# Patient Record
Sex: Female | Born: 1935 | Race: White | Hispanic: No | Marital: Married | Smoking: Never smoker
Health system: Southern US, Community
[De-identification: ages and names within clinical notes are randomized; demographics above are authoritative.]

## PROBLEM LIST (undated history)

## (undated) DIAGNOSIS — I219 Acute myocardial infarction, unspecified: Secondary | ICD-10-CM

## (undated) DIAGNOSIS — I509 Heart failure, unspecified: Secondary | ICD-10-CM

## (undated) DIAGNOSIS — I4891 Unspecified atrial fibrillation: Secondary | ICD-10-CM

## (undated) DIAGNOSIS — I2510 Atherosclerotic heart disease of native coronary artery without angina pectoris: Secondary | ICD-10-CM

## (undated) DIAGNOSIS — I10 Essential (primary) hypertension: Secondary | ICD-10-CM

## (undated) DIAGNOSIS — N184 Chronic kidney disease, stage 4 (severe): Secondary | ICD-10-CM

## (undated) DIAGNOSIS — J449 Chronic obstructive pulmonary disease, unspecified: Secondary | ICD-10-CM

## (undated) DIAGNOSIS — J9690 Respiratory failure, unspecified, unspecified whether with hypoxia or hypercapnia: Secondary | ICD-10-CM

## (undated) HISTORY — PX: NEPHRECTOMY: SHX65

---

## 2020-07-30 ENCOUNTER — Other Ambulatory Visit: Payer: Self-pay

## 2020-07-30 ENCOUNTER — Encounter (HOSPITAL_COMMUNITY): Payer: Self-pay | Admitting: Emergency Medicine

## 2020-07-30 ENCOUNTER — Emergency Department (HOSPITAL_COMMUNITY): Payer: Medicare Other

## 2020-07-30 ENCOUNTER — Inpatient Hospital Stay (HOSPITAL_COMMUNITY)
Admission: EM | Admit: 2020-07-30 | Discharge: 2020-08-02 | DRG: 291 | Disposition: A | Discharge status: Skilled Nursing Facility | Payer: Medicare Other | Source: Skilled Nursing Facility | Attending: Internal Medicine | Admitting: Internal Medicine

## 2020-07-30 DIAGNOSIS — E1165 Type 2 diabetes mellitus with hyperglycemia: Secondary | ICD-10-CM | POA: Diagnosis present

## 2020-07-30 DIAGNOSIS — I130 Hypertensive heart and chronic kidney disease with heart failure and stage 1 through stage 4 chronic kidney disease, or unspecified chronic kidney disease: Secondary | ICD-10-CM | POA: Diagnosis not present

## 2020-07-30 DIAGNOSIS — Z7901 Long term (current) use of anticoagulants: Secondary | ICD-10-CM | POA: Diagnosis not present

## 2020-07-30 DIAGNOSIS — Z8616 Personal history of COVID-19: Secondary | ICD-10-CM | POA: Diagnosis not present

## 2020-07-30 DIAGNOSIS — I252 Old myocardial infarction: Secondary | ICD-10-CM

## 2020-07-30 DIAGNOSIS — H9190 Unspecified hearing loss, unspecified ear: Secondary | ICD-10-CM | POA: Diagnosis present

## 2020-07-30 DIAGNOSIS — I272 Pulmonary hypertension, unspecified: Secondary | ICD-10-CM | POA: Diagnosis present

## 2020-07-30 DIAGNOSIS — Z888 Allergy status to other drugs, medicaments and biological substances status: Secondary | ICD-10-CM | POA: Diagnosis not present

## 2020-07-30 DIAGNOSIS — Z885 Allergy status to narcotic agent status: Secondary | ICD-10-CM

## 2020-07-30 DIAGNOSIS — J9690 Respiratory failure, unspecified, unspecified whether with hypoxia or hypercapnia: Secondary | ICD-10-CM | POA: Insufficient documentation

## 2020-07-30 DIAGNOSIS — Z905 Acquired absence of kidney: Secondary | ICD-10-CM | POA: Diagnosis not present

## 2020-07-30 DIAGNOSIS — E119 Type 2 diabetes mellitus without complications: Secondary | ICD-10-CM

## 2020-07-30 DIAGNOSIS — I959 Hypotension, unspecified: Secondary | ICD-10-CM | POA: Diagnosis not present

## 2020-07-30 DIAGNOSIS — J90 Pleural effusion, not elsewhere classified: Secondary | ICD-10-CM

## 2020-07-30 DIAGNOSIS — Z20822 Contact with and (suspected) exposure to covid-19: Secondary | ICD-10-CM | POA: Diagnosis present

## 2020-07-30 DIAGNOSIS — I5043 Acute on chronic combined systolic (congestive) and diastolic (congestive) heart failure: Secondary | ICD-10-CM | POA: Diagnosis present

## 2020-07-30 DIAGNOSIS — R9431 Abnormal electrocardiogram [ECG] [EKG]: Secondary | ICD-10-CM | POA: Diagnosis not present

## 2020-07-30 DIAGNOSIS — Z880 Allergy status to penicillin: Secondary | ICD-10-CM | POA: Diagnosis not present

## 2020-07-30 DIAGNOSIS — E1122 Type 2 diabetes mellitus with diabetic chronic kidney disease: Secondary | ICD-10-CM | POA: Diagnosis present

## 2020-07-30 DIAGNOSIS — I509 Heart failure, unspecified: Secondary | ICD-10-CM

## 2020-07-30 DIAGNOSIS — N179 Acute kidney failure, unspecified: Secondary | ICD-10-CM | POA: Diagnosis present

## 2020-07-30 DIAGNOSIS — R739 Hyperglycemia, unspecified: Secondary | ICD-10-CM

## 2020-07-30 DIAGNOSIS — R0602 Shortness of breath: Secondary | ICD-10-CM

## 2020-07-30 DIAGNOSIS — I248 Other forms of acute ischemic heart disease: Secondary | ICD-10-CM | POA: Diagnosis present

## 2020-07-30 DIAGNOSIS — N390 Urinary tract infection, site not specified: Secondary | ICD-10-CM

## 2020-07-30 DIAGNOSIS — I480 Paroxysmal atrial fibrillation: Secondary | ICD-10-CM | POA: Diagnosis present

## 2020-07-30 DIAGNOSIS — D696 Thrombocytopenia, unspecified: Secondary | ICD-10-CM

## 2020-07-30 DIAGNOSIS — J9811 Atelectasis: Secondary | ICD-10-CM | POA: Diagnosis present

## 2020-07-30 DIAGNOSIS — R031 Nonspecific low blood-pressure reading: Secondary | ICD-10-CM | POA: Diagnosis present

## 2020-07-30 DIAGNOSIS — N184 Chronic kidney disease, stage 4 (severe): Secondary | ICD-10-CM | POA: Diagnosis present

## 2020-07-30 DIAGNOSIS — N289 Disorder of kidney and ureter, unspecified: Secondary | ICD-10-CM

## 2020-07-30 DIAGNOSIS — J9622 Acute and chronic respiratory failure with hypercapnia: Secondary | ICD-10-CM | POA: Diagnosis present

## 2020-07-30 DIAGNOSIS — Y95 Nosocomial condition: Secondary | ICD-10-CM | POA: Diagnosis present

## 2020-07-30 DIAGNOSIS — F0390 Unspecified dementia without behavioral disturbance: Secondary | ICD-10-CM | POA: Diagnosis present

## 2020-07-30 DIAGNOSIS — J9621 Acute and chronic respiratory failure with hypoxia: Secondary | ICD-10-CM | POA: Diagnosis present

## 2020-07-30 DIAGNOSIS — Z66 Do not resuscitate: Secondary | ICD-10-CM | POA: Diagnosis present

## 2020-07-30 DIAGNOSIS — J189 Pneumonia, unspecified organism: Secondary | ICD-10-CM | POA: Diagnosis present

## 2020-07-30 DIAGNOSIS — I2510 Atherosclerotic heart disease of native coronary artery without angina pectoris: Secondary | ICD-10-CM | POA: Diagnosis present

## 2020-07-30 HISTORY — DX: Heart failure, unspecified: I50.9

## 2020-07-30 HISTORY — DX: Atherosclerotic heart disease of native coronary artery without angina pectoris: I25.10

## 2020-07-30 HISTORY — DX: Chronic kidney disease, stage 4 (severe): N18.4

## 2020-07-30 HISTORY — DX: Essential (primary) hypertension: I10

## 2020-07-30 HISTORY — DX: Unspecified atrial fibrillation: I48.91

## 2020-07-30 HISTORY — DX: Acute myocardial infarction, unspecified: I21.9

## 2020-07-30 HISTORY — DX: Respiratory failure, unspecified, unspecified whether with hypoxia or hypercapnia: J96.90

## 2020-07-30 LAB — COMPREHENSIVE METABOLIC PANEL
ALT: 28 U/L (ref 0–44)
AST: 41 U/L (ref 15–41)
Albumin: 3.1 g/dL — ABNORMAL LOW (ref 3.5–5.0)
Alkaline Phosphatase: 74 U/L (ref 38–126)
Anion gap: 14 (ref 5–15)
BUN: 20 mg/dL (ref 8–23)
CO2: 20 mmol/L — ABNORMAL LOW (ref 22–32)
Calcium: 9.1 mg/dL (ref 8.9–10.3)
Chloride: 102 mmol/L (ref 98–111)
Creatinine, Ser: 1.95 mg/dL — ABNORMAL HIGH (ref 0.44–1.00)
GFR, Estimated: 23 mL/min — ABNORMAL LOW (ref >60)
Glucose, Bld: 285 mg/dL — ABNORMAL HIGH (ref 70–99)
Potassium: 3.7 mmol/L (ref 3.5–5.1)
Sodium: 136 mmol/L (ref 135–145)
Total Bilirubin: 0.9 mg/dL (ref 0.3–1.2)
Total Protein: 6.2 g/dL — ABNORMAL LOW (ref 6.5–8.1)

## 2020-07-30 LAB — CBC WITH DIFFERENTIAL/PLATELET
Abs Immature Granulocytes: 0.14 10*3/uL — ABNORMAL HIGH (ref 0.00–0.07)
Basophils Absolute: 0 10*3/uL (ref 0.0–0.1)
Basophils Relative: 0 %
Eosinophils Absolute: 0 10*3/uL (ref 0.0–0.5)
Eosinophils Relative: 0 %
HCT: 38.3 % (ref 36.0–46.0)
Hemoglobin: 11.4 g/dL — ABNORMAL LOW (ref 12.0–15.0)
Immature Granulocytes: 2 %
Lymphocytes Relative: 17 %
Lymphs Abs: 1.3 10*3/uL (ref 0.7–4.0)
MCH: 28.6 pg (ref 26.0–34.0)
MCHC: 29.8 g/dL — ABNORMAL LOW (ref 30.0–36.0)
MCV: 96.2 fL (ref 80.0–100.0)
Monocytes Absolute: 0.3 10*3/uL (ref 0.1–1.0)
Monocytes Relative: 4 %
Neutro Abs: 6.1 10*3/uL (ref 1.7–7.7)
Neutrophils Relative %: 77 %
Platelets: 119 10*3/uL — ABNORMAL LOW (ref 150–400)
RBC: 3.98 MIL/uL (ref 3.87–5.11)
RDW: 14.4 % (ref 11.5–15.5)
WBC: 7.8 10*3/uL (ref 4.0–10.5)
nRBC: 0 % (ref 0.0–0.2)

## 2020-07-30 LAB — GLUCOSE, CAPILLARY
Glucose-Capillary: 97 mg/dL (ref 70–99)
Glucose-Capillary: 111 mg/dL — ABNORMAL HIGH (ref 70–99)

## 2020-07-30 LAB — TROPONIN I (HIGH SENSITIVITY)
Troponin I (High Sensitivity): 29 ng/L — ABNORMAL HIGH (ref <18)
Troponin I (High Sensitivity): 50 ng/L — ABNORMAL HIGH (ref <18)

## 2020-07-30 LAB — URINALYSIS, ROUTINE W REFLEX MICROSCOPIC
Bilirubin Urine: NEGATIVE
Glucose, UA: NEGATIVE mg/dL
Hgb urine dipstick: NEGATIVE
Ketones, ur: NEGATIVE mg/dL
Leukocytes,Ua: NEGATIVE
Nitrite: NEGATIVE
Protein, ur: NEGATIVE mg/dL
Specific Gravity, Urine: 1.006 (ref 1.005–1.030)
pH: 5 (ref 5.0–8.0)

## 2020-07-30 LAB — CBG MONITORING, ED
Glucose-Capillary: 96 mg/dL (ref 70–99)
Glucose-Capillary: 100 mg/dL — ABNORMAL HIGH (ref 70–99)

## 2020-07-30 LAB — I-STAT CHEM 8, ED
BUN: 29 mg/dL — ABNORMAL HIGH (ref 8–23)
Calcium, Ion: 1.13 mmol/L — ABNORMAL LOW (ref 1.15–1.40)
Chloride: 103 mmol/L (ref 98–111)
Creatinine, Ser: 1.9 mg/dL — ABNORMAL HIGH (ref 0.44–1.00)
Glucose, Bld: 283 mg/dL — ABNORMAL HIGH (ref 70–99)
HCT: 36 % (ref 36.0–46.0)
Hemoglobin: 12.2 g/dL (ref 12.0–15.0)
Potassium: 3.7 mmol/L (ref 3.5–5.1)
Sodium: 136 mmol/L (ref 135–145)
TCO2: 24 mmol/L (ref 22–32)

## 2020-07-30 LAB — RESP PANEL BY RT-PCR (FLU A&B, COVID) ARPGX2
Influenza A by PCR: NEGATIVE
Influenza B by PCR: NEGATIVE
SARS Coronavirus 2 by RT PCR: NEGATIVE

## 2020-07-30 LAB — HEMOGLOBIN A1C
Hgb A1c MFr Bld: 5.2 % (ref 4.8–5.6)
Mean Plasma Glucose: 102.54 mg/dL

## 2020-07-30 LAB — BRAIN NATRIURETIC PEPTIDE: B Natriuretic Peptide: 1149.9 pg/mL — ABNORMAL HIGH (ref 0.0–100.0)

## 2020-07-30 LAB — MAGNESIUM: Magnesium: 2 mg/dL (ref 1.7–2.4)

## 2020-07-30 LAB — PROCALCITONIN: Procalcitonin: 0.37 ng/mL

## 2020-07-30 MED ORDER — ALBUTEROL SULFATE (2.5 MG/3ML) 0.083% IN NEBU
2.5000 mg | INHALATION_SOLUTION | Freq: Once | RESPIRATORY_TRACT | Status: AC
  Administered 2020-07-30: 2.5 mg via RESPIRATORY_TRACT
  Filled 2020-07-30: qty 3

## 2020-07-30 MED ORDER — APIXABAN 2.5 MG PO TABS
2.5000 mg | ORAL_TABLET | Freq: Two times a day (BID) | ORAL | Status: DC
  Administered 2020-07-30 – 2020-08-02 (×7): 2.5 mg via ORAL
  Filled 2020-07-30 (×8): qty 1

## 2020-07-30 MED ORDER — SODIUM CHLORIDE 0.9 % IV SOLN
2.0000 g | Freq: Once | INTRAVENOUS | Status: AC
  Administered 2020-07-30: 2 g via INTRAVENOUS
  Filled 2020-07-30: qty 2

## 2020-07-30 MED ORDER — SODIUM CHLORIDE 0.9 % IV SOLN
100.0000 mg | Freq: Two times a day (BID) | INTRAVENOUS | Status: DC
  Administered 2020-07-30 – 2020-08-01 (×5): 100 mg via INTRAVENOUS
  Filled 2020-07-30 (×8): qty 100

## 2020-07-30 MED ORDER — VANCOMYCIN HCL 1000 MG/200ML IV SOLN
1000.0000 mg | Freq: Once | INTRAVENOUS | Status: AC
  Administered 2020-07-30: 1000 mg via INTRAVENOUS
  Filled 2020-07-30: qty 200

## 2020-07-30 MED ORDER — SODIUM CHLORIDE 0.9% FLUSH
3.0000 mL | Freq: Two times a day (BID) | INTRAVENOUS | Status: DC
  Administered 2020-07-30 – 2020-08-02 (×7): 3 mL via INTRAVENOUS

## 2020-07-30 MED ORDER — CEFTRIAXONE SODIUM 1 G IJ SOLR
1.0000 g | INTRAMUSCULAR | Status: DC
  Administered 2020-07-30 – 2020-07-31 (×2): 1 g via INTRAVENOUS
  Filled 2020-07-30 (×2): qty 10

## 2020-07-30 MED ORDER — AMIODARONE HCL 200 MG PO TABS
300.0000 mg | ORAL_TABLET | Freq: Two times a day (BID) | ORAL | Status: AC
  Administered 2020-07-30 – 2020-08-01 (×6): 300 mg via ORAL
  Filled 2020-07-30: qty 1
  Filled 2020-07-30: qty 2
  Filled 2020-07-30 (×4): qty 1

## 2020-07-30 MED ORDER — MELATONIN 3 MG PO TABS
6.0000 mg | ORAL_TABLET | Freq: Every day | ORAL | Status: DC
  Administered 2020-07-30 – 2020-08-01 (×3): 6 mg via ORAL
  Filled 2020-07-30 (×4): qty 2

## 2020-07-30 MED ORDER — SODIUM CHLORIDE 0.9 % IV SOLN: 1.0000 g | INTRAVENOUS | Status: DC

## 2020-07-30 MED ORDER — APIXABAN 5 MG PO TABS: 5.0000 mg | ORAL_TABLET | Freq: Two times a day (BID) | ORAL | Status: DC

## 2020-07-30 MED ORDER — INSULIN ASPART 100 UNIT/ML ~~LOC~~ SOLN: 0.0000 [IU] | Freq: Three times a day (TID) | SUBCUTANEOUS | Status: DC

## 2020-07-30 MED ORDER — FUROSEMIDE 10 MG/ML IJ SOLN
40.0000 mg | Freq: Two times a day (BID) | INTRAMUSCULAR | Status: DC
  Administered 2020-07-30 – 2020-08-01 (×5): 40 mg via INTRAVENOUS
  Filled 2020-07-30 (×5): qty 4

## 2020-07-30 MED ORDER — POTASSIUM CHLORIDE CRYS ER 20 MEQ PO TBCR
40.0000 meq | EXTENDED_RELEASE_TABLET | ORAL | Status: AC
  Administered 2020-07-30: 40 meq via ORAL
  Filled 2020-07-30: qty 2

## 2020-07-30 MED ORDER — SODIUM CHLORIDE 0.9 % IV SOLN
250.0000 mL | INTRAVENOUS | Status: DC | PRN
Start: 2020-07-30 — End: 2020-08-03

## 2020-07-30 MED ORDER — SODIUM CHLORIDE 0.9% FLUSH: 3.0000 mL | INTRAVENOUS | Status: DC | PRN

## 2020-07-30 MED ORDER — FUROSEMIDE 10 MG/ML IJ SOLN
40.0000 mg | Freq: Once | INTRAMUSCULAR | Status: AC
  Administered 2020-07-30: 40 mg via INTRAVENOUS
  Filled 2020-07-30: qty 4

## 2020-07-30 MED ORDER — ONDANSETRON HCL 4 MG/2ML IJ SOLN: 4.0000 mg | Freq: Four times a day (QID) | INTRAMUSCULAR | Status: DC | PRN

## 2020-07-30 MED ORDER — AMIODARONE HCL 100 MG PO TABS
100.0000 mg | ORAL_TABLET | Freq: Every day | ORAL | Status: DC
  Administered 2020-08-02: 100 mg via ORAL
  Filled 2020-07-30: qty 1

## 2020-07-30 MED ORDER — ALBUTEROL SULFATE (2.5 MG/3ML) 0.083% IN NEBU: 2.5000 mg | INHALATION_SOLUTION | Freq: Four times a day (QID) | RESPIRATORY_TRACT | Status: DC | PRN

## 2020-07-30 MED ORDER — FUROSEMIDE 10 MG/ML IJ SOLN: 40.0000 mg | Freq: Every day | INTRAMUSCULAR | Status: DC

## 2020-07-30 MED ORDER — ACETAMINOPHEN 325 MG PO TABS
650.0000 mg | ORAL_TABLET | ORAL | Status: DC | PRN
  Administered 2020-08-01 (×3): 650 mg via ORAL
  Filled 2020-07-30 (×3): qty 2

## 2020-07-30 MED ORDER — ADULT MULTIVITAMIN W/MINERALS CH
1.0000 | ORAL_TABLET | Freq: Every day | ORAL | Status: DC
  Administered 2020-07-30 – 2020-08-02 (×4): 1 via ORAL
  Filled 2020-07-30 (×4): qty 1

## 2020-07-30 NOTE — ED Notes (Signed)
Lunch Tray Ordered @ 1016. 

## 2020-07-30 NOTE — Progress Notes (Signed)
Patient trialed off BIPAP at this time. Patient tolerating 2L [place] well.

## 2020-07-30 NOTE — Consult Note (Signed)
Cardiology Consultation:   Patient ID: Kristin Adkins MRN: [id number]; DOB: Jul 30, 1920  Admit date: 07/30/2020 Date of Consult: 07/30/2020  PCP:  Jene Every, MD   [place]  Cardiologist: New patient  Patient Profile:   Kristin Adkins is a 87 y.o. female who is being seen today for the evaluation of CHF at the request of Fuller Plan, MD.  History of Present Illness:   Kristin Adkins is a 87 y.o. 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  Patient only notes that she has shortness of breath and feels like she may be wheezing, but denies any other complaints at this time. Patient son provides additional history as he reports that the patient has some dementia and is hard of hearing.   Patient had been hospitalized 06/01/2020 for flash pulmonary edema, 1/16 for pneumonia due to COVID-19, and 2/5 for respiratory failure with hypoxia secondary to to CHF and atrial fibrillation.  During her last hospitalization she was found to have an acute decrease in EF from > 55% in 05/2020 down to 30-35%, but cardiac catheterization was not pursued due to the possibility of it worsening her kidney function.  She had been diagnosed with a urinary tract infection while at rehab and started on ciprofloxacin 2 days ago.   The patient was hypoxic on arrival to the ED requiring BiPAP, currently saturating 99% on 2 L of nasal cannula.  Blood cultures were obtained.  She was given Lasix 40 mg IV.  Vancomycin and aztreonam were started for the possibility of a healthcare associated pneumonia.  Past Medical History:  Diagnosis Date  . AF (atrial  fibrillation) ([place])   . CHF (congestive heart failure) ([place])   . CKD (chronic kidney disease), stage IV ([place])   . Coronary artery disease   . Hypertension   . Myocardial infarction ([place])   . Respiratory failure [facility])     Past Surgical History:  Procedure Laterality Date  . 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 [START ON 08/02/2020] amiodarone  100 mg Oral Daily  . amiodarone  300 mg Oral BID  . apixaban  2.5 mg Oral BID  . [START ON 07/31/2020] furosemide  40 mg Intravenous Daily  . insulin aspart  0-6 Units Subcutaneous TID WC  . melatonin  6 mg Oral QHS  . multivitamin with minerals  1 tablet Oral Daily  . sodium chloride flush  3 mL Intravenous Q12H   Continuous Infusions: . sodium chloride    . cefTRIAXone (ROCEPHIN)  IV    . doxycycline (VIBRAMYCIN) IV     PRN Meds: sodium chloride, acetaminophen, albuterol, sodium chloride flush  Allergies:    Allergies  Allergen Reactions  . Codeine     Other reaction(s): UNKNOWN  . Nitrofurantoin     Other reaction(s): UNKNOWN  . Penicillins Rash    Social History:   Social History   Socioeconomic History  . Marital status: Married    Spouse name: Not on file  . Number of children: Not on file  . Years of education: Not on file  . Highest education level:  Not on file  Occupational History  . Not on file  Tobacco Use  . Smoking status: Never Smoker  . Smokeless tobacco: Never Used  Substance and Sexual Activity  . Alcohol use: Never  . Drug use: Not on file  . Sexual activity: Not on file  Other Topics Concern  . Not on file  Social History Narrative  . Not on file   Social Determinants of Health   Financial Resource Strain: Not on file  Food Insecurity: Not on file  Transportation Needs: Not on file  Physical Activity: Not on file  Stress: Not on file  Social Connections: Not on file  Intimate Partner  Violence: Not on file    Family History:   History reviewed. No pertinent family history.   ROS:  Please see the history of present illness.  All other ROS reviewed and negative.     Physical Exam/Data:   Vitals:   07/30/20 1015 07/30/20 1100 07/30/20 1130 07/30/20 1143  BP: (!) 114/53 (!) 119/56  113/80  Pulse: 79 79 72 78  Resp: 18 (!) '22 12 20  '$ Temp:    97.9 F (36.6 C)  TempSrc:    Oral  SpO2: 98% 97% 95% 98%  Weight:      Height:        Intake/Output Summary (Last 24 hours) at 07/30/2020 1221 Last data filed at 07/30/2020 0835 Gross per 24 hour  Intake 101.68 ml  Output --  Net 101.68 ml   Last 3 Weights 07/30/2020  Weight (lbs) 157 lb  Weight (kg) 71.215 kg     Body mass index is 28.72 kg/m.  General:  Well nourished, well developed, in no acute distress, hard of hearing HEENT: normal Lymph: no adenopathy Neck: +6 cm bilaterally JVD Endocrine:  No thryomegaly Vascular: No carotid bruits; FA pulses 2+ bilaterally without bruits  Cardiac:  normal S1, S2; RRR; no murmur Lungs: Crackles 12 rotation bilaterally, no wheezing Abd: soft, nontender, no hepatomegaly  Ext: Bilateral 2+ edema up to the knees Musculoskeletal:  No deformities, BUE and BLE strength normal and equal Skin: warm and dry  Neuro:  CNs 2-12 intact, no focal abnormalities noted Psych:  Normal affect   EKG:  The EKG was personally reviewed and demonstrates: SR, 1.AVB, LBBB, no prior ECG available, this was personally reviewed Telemetry:  Telemetry was personally reviewed and demonstrates:  SR  Relevant CV Studies:  No prior echo, new ordered   Laboratory Data:  High Sensitivity Troponin:   Recent Labs  Lab 07/30/20 0420 07/30/20 0615  TROPONINIHS 29* 50*     Chemistry Recent Labs  Lab 07/30/20 0420 07/30/20 0557  NA 136 136  K 3.7 3.7  CL 102 103  CO2 20*  --   GLUCOSE 285* 283*  BUN 20 29*  CREATININE 1.95* 1.90*  CALCIUM 9.1  --   GFRNONAA 23*  --   ANIONGAP 14  --      Recent Labs  Lab 07/30/20 0420  PROT 6.2*  ALBUMIN 3.1*  AST 41  ALT 28  ALKPHOS 74  BILITOT 0.9   Hematology Recent Labs  Lab 07/30/20 0420 07/30/20 0557  WBC 7.8  --   RBC 3.98  --   HGB 11.4* 12.2  HCT 38.3 36.0  MCV 96.2  --   MCH 28.6  --   MCHC 29.8*  --   RDW 14.4  --   PLT 119*  --    BNP Recent Labs  Lab 07/30/20  0420  BNP 1,149.9*    DDimer No results for input(s): DDIMER in the last 168 hours.  Radiology/Studies:  DG Chest Portable 1 View  Result Date: 07/30/2020 CLINICAL DATA:  Shortness of breath.  Respiratory distress EXAM: PORTABLE CHEST 1 VIEW COMPARISON:  07/15/2020 FINDINGS: Chronic cardiomegaly. Hazy opacity at the bases primarily attributed to pleural effusion. There is superimposed pulmonary opacity that could be atelectasis or pneumonia. Upper lung markings are similar to before. No pneumothorax. IMPRESSION: Cardiomegaly, vascular congestion, and bilateral pleural effusion. Atelectasis or infiltrate may be superimposed at the lower lobes. Electronically Signed   By: Monte Fantasia M.D.   On: 07/30/2020 04:52    Assessment and Plan:   1. Acute CHF - BNP 1149, Crea 1.9, no prior, Na 136, K 3.7 - CXR shows B/L pulmonary edema and mild pleural effusions - I would obtain an echocardiogram since we do not have any in our system - I would increase Lasix to 40 mg IV twice daily, follow creatinine closely --Strict I&Os and daily weights -Apply TED hose -Her blood pressure is soft, hold home metoprolol and spironolactone for now restart as tolerated by blood pressure  2. Minimal troponin elevation  - demand ischemia with flat trend 29->50 - ECG with LBBB, no prior available for comparison  3. Respiratory failure with hypoxia  - secondary to combined systolic and diastolic congestive heart failure exacerbation: Acute on chronic. -Currently on nasal cannula saturating 99%  4. Acute kidney injury superimposed on chronic kidney disease stage IV:  Patient presents with creatinine elevated up to 1.95 with BUN 20.  Son notes patient has solitary kidney.  Per review of records on care everywhere patient's baseline has been between 1.2-1.5.  -Continue to monitor kidney function daily   Recent pneumonia due to COVID-19 infection suspect healthcare associated pneumonia -Per primary team  Paroxysmal atrial fibrillation  -Continue Eliquis and amiodarone, patient is currently in sinus rhythm, hemoglobin is 12.2   Risk Assessment/Risk Scores:   New York Heart Association (NYHA) Functional Class NYHA Class III   For questions or updates, please contact [facility] Please consult [email or web address] for contact info under   Signed, Ena Dawley, MD  07/30/2020 12:21 PM

## 2020-07-30 NOTE — ED Notes (Signed)
Pt off bi-pap tolerating ok so far  She just had blood cultures drawn there is now an order for an I-stat

## 2020-07-30 NOTE — ED Notes (Signed)
Son is leaving

## 2020-07-30 NOTE — ED Provider Notes (Signed)
[facility] EMERGENCY DEPARTMENT Provider Note   CSN: [id number] Arrival date & time: 07/30/20  0408     History No chief complaint on file.   Kristin Adkins is a 87 y.o. female.  The history is provided by the EMS personnel. The history is limited by the condition of the patient.  Shortness of Breath Severity:  Severe Onset quality:  Sudden Timing:  Constant Progression:  Unchanged Chronicity:  Recurrent Context: not URI   Context comment:  H/o AFIB on anticoagulation and CHF Relieved by:  Nothing Worsened by:  Nothing Ineffective treatments:  None tried Associated symptoms: no abdominal pain, no diaphoresis, no rash and no vomiting   Risk factors: no recent surgery        History reviewed. No pertinent past medical history.  There are no problems to display for this patient.   History reviewed. No pertinent surgical history.   OB History   No obstetric history on file.     History reviewed. No pertinent family history.     Home Medications Prior to Admission medications   Not on File    Allergies    Patient has no allergy information on record.  Review of Systems   Review of Systems  Unable to perform ROS: Acuity of condition  Constitutional: Negative for diaphoresis.  Respiratory: Positive for shortness of breath.   Gastrointestinal: Negative for abdominal pain and vomiting.  Skin: Negative for rash.  Neurological: Negative for facial asymmetry.  Psychiatric/Behavioral: Negative for agitation.    Physical Exam Updated Vital Signs There were no vitals taken for this visit.  Physical Exam Vitals and nursing note reviewed.  Constitutional:      General: She is in acute distress.     Appearance: Normal appearance. She is not diaphoretic.  HENT:     Head: Normocephalic and atraumatic.     Nose: Nose normal.  Eyes:     Conjunctiva/sclera: Conjunctivae normal.     Pupils: Pupils are equal, round, and reactive to light.   Cardiovascular:     Pulses: Normal pulses.     Heart sounds: Normal heart sounds.  Pulmonary:     Breath sounds: Rales present.  Abdominal:     General: Abdomen is flat. Bowel sounds are normal.     Palpations: Abdomen is soft.     Tenderness: There is no abdominal tenderness. There is no guarding.  Musculoskeletal:     Cervical back: Normal range of motion and neck supple.     Right lower leg: Edema present.     Left lower leg: Edema present.  Skin:    General: Skin is warm and dry.     Capillary Refill: Capillary refill takes less than 2 seconds.  Neurological:     Mental Status: She is alert.     Deep Tendon Reflexes: Reflexes normal.  Psychiatric:     Comments: Unable      ED Results / Procedures / Treatments   Labs (all labs ordered are listed, but only abnormal results are displayed) Results for orders placed or performed during the hospital encounter of 07/30/20  CBC with Differential/Platelet  Result Value Ref Range   WBC 7.8 4.0 - 10.5 K/uL   RBC 3.98 3.87 - 5.11 MIL/uL   Hemoglobin 11.4 (L) 12.0 - 15.0 g/dL   HCT 38.3 36.0 - 46.0 %   MCV 96.2 80.0 - 100.0 fL   MCH 28.6 26.0 - 34.0 pg   MCHC 29.8 (L) 30.0 - 36.0 g/dL  RDW 14.4 11.5 - 15.5 %   Platelets 119 (L) 150 - 400 K/uL   nRBC 0.0 0.0 - 0.2 %   Neutrophils Relative % 77 %   Neutro Abs 6.1 1.7 - 7.7 K/uL   Lymphocytes Relative 17 %   Lymphs Abs 1.3 0.7 - 4.0 K/uL   Monocytes Relative 4 %   Monocytes Absolute 0.3 0.1 - 1.0 K/uL   Eosinophils Relative 0 %   Eosinophils Absolute 0.0 0.0 - 0.5 K/uL   Basophils Relative 0 %   Basophils Absolute 0.0 0.0 - 0.1 K/uL   Immature Granulocytes 2 %   Abs Immature Granulocytes 0.14 (H) 0.00 - 0.07 K/uL   Ovalocytes PRESENT    DG Chest Portable 1 View  Result Date: 07/30/2020 CLINICAL DATA:  Shortness of breath.  Respiratory distress EXAM: PORTABLE CHEST 1 VIEW COMPARISON:  07/15/2020 FINDINGS: Chronic cardiomegaly. Hazy opacity at the bases primarily  attributed to pleural effusion. There is superimposed pulmonary opacity that could be atelectasis or pneumonia. Upper lung markings are similar to before. No pneumothorax. IMPRESSION: Cardiomegaly, vascular congestion, and bilateral pleural effusion. Atelectasis or infiltrate may be superimposed at the lower lobes. Electronically Signed   By: Monte Fantasia M.D.   On: 07/30/2020 04:52    EKG  EKG Interpretation  Date/Time:  Sunday July 30 2020 04:20:02 EST Ventricular Rate:  78 PR Interval:    QRS Duration: 186 QT Interval:  446 QTC Calculation: 509 R Axis:   0 Text Interpretation: Sinus rhythm Prolonged PR interval Left bundle branch block Confirmed by Randal Buba, April ([id number]) on 07/30/2020 4:23:58 AM       Radiology No results found.  Procedures Procedures   Medications Ordered in ED Medications  vancomycin (VANCOREADY) IVPB 1000 mg/200 mL (has no administration in time range)  aztreonam (AZACTAM) 2 g in sodium chloride 0.9 % 100 mL IVPB (has no administration in time range)  furosemide (LASIX) injection 40 mg (40 mg Intravenous Given 07/30/20 0540)    ED Course  I have reviewed the triage vital signs and the nursing notes.  Pertinent labs & imaging results that were available during my care of the patient were reviewed by me and considered in my medical decision making (see chart for details).    MDM Reviewed: nursing note, vitals and previous chart Interpretation: labs, ECG and x-ray (pulmonary edema and effusion by me ) Total time providing critical care: 30-74 minutes (bipap in the ED). This excludes time spent performing separately reportable procedures and services. Consults: admitting MD  CRITICAL CARE Performed by: April K Palumbo-Rasch Total critical care time: 60 minutes Critical care time was exclusive of separately billable procedures and treating other patients. Critical care was necessary to treat or prevent imminent or life-threatening  deterioration. Critical care was time spent personally by me on the following activities: development of treatment plan with patient and/or surrogate as well as nursing, discussions with consultants, evaluation of patient's response to treatment, examination of patient, obtaining history from patient or surrogate, ordering and performing treatments and interventions, ordering and review of laboratory studies, ordering and review of radiographic studies, pulse oximetry and re-evaluation of patient's condition.  Final Clinical Impression(s) / ED Diagnoses Final diagnoses:  None    Pulmonary edema and effusion and likely covid changes in the lungs on CXR.  Cultures drawn, lasix given patient is now resting comfortably.  Admit to medicine      Palumbo, April, MD 07/30/20 [phone]

## 2020-07-30 NOTE — Plan of Care (Signed)
  Problem: Clinical Measurements: Goal: Ability to maintain clinical measurements within normal limits will improve Outcome: Progressing Goal: Diagnostic test results will improve Outcome: Progressing   

## 2020-07-30 NOTE — ED Triage Notes (Signed)
The pt arrived by [facility] from [facility] in [place]  Ems states they were told that   The pt was walking to the br and on the way back she began to have sob.  Ems gave her sl nitro x 2 5 mg of albuterol pt placed on c-pap by [facility].  Hospitalized for covid pneumonia jan 1th.  cbg [id number]

## 2020-07-30 NOTE — H&P (Addendum)
History and Physical    Kristin Adkins [id number] DOB: 1920-07-13 DOA: 07/30/2020  Referring MD/NP/PA: April Palombo, MD PCP: Jene Every, MD  Patient coming from: [facility] via EMS  Chief Complaint: Shortness of breath  I have personally briefly reviewed patient's old medical records in [place]   HPI: Kristin Adkins is a 87 y.o. 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  Patient only notes that she has shortness of breath and feels like she may be wheezing, but denies any other complaints at this time. Patient son provides additional history as he reports that the patient has some dementia and is hard of hearing.  Patient had been hospitalized 06/01/2020 for flash pulmonary edema, 1/16 for pneumonia due to COVID-19, and 2/5 for respiratory failure with hypoxia secondary to to CHF and atrial fibrillation.  During her last hospitalization she was found to have an acute decrease in EF from > 55% in 05/2020 down to 30-35%, but cardiac catheterization was not pursued due to the possibility of it worsening her kidney function.  She had been diagnosed with a urinary tract infection while at rehab and started on ciprofloxacin 2 days ago.  He notes that staff noted that the patient was a little confused the other day.  En route with EMS patient was given 2 sublingual nitroglycerin, albuterol, and placed on CPAP.  ED Course: Upon admission to the emergency department patient was seen to be afebrile, respirations 11-22, blood pressure 103/39-132/68, heart rates 66-84, and O2 saturations initially maintained on BiPAP.  Patient was able to be weaned from BiPAP to 2 L  nasal cannula oxygen.  Lab work significant for hemoglobin 11.4, platelets 119, BUN 20, creatinine 1.95, glucose 285, BNP 1149.9, and high-sensitivity troponin 29->50.  Chest x-ray revealed cardiomegaly, vascular congestion, and bilateral pleural effusions with atelectasis or infiltrate of possibly superimposed at the lower lobes.  Blood cultures were obtained.  She was given Lasix 40 mg IV.  Vancomycin and aztreonam were started for the possibility of a healthcare associated pneumonia.  Review of Systems  Unable to perform ROS: Dementia  Respiratory: Positive for shortness of breath and wheezing.     Past Medical History:  Diagnosis Date  . AF (atrial fibrillation) ([place])   . CHF (congestive heart failure) ([place])   . CKD (chronic kidney disease), stage IV ([place])   . Coronary artery disease   . Hypertension   . Myocardial infarction ([place])   . Respiratory failure ([place])     History reviewed. No pertinent surgical history.   reports that she has never smoked. She has never used smokeless tobacco. She reports that she does not drink alcohol. No history on file for drug use.  Allergies  Allergen Reactions  . Codeine     Other reaction(s): UNKNOWN  . Nitrofurantoin     Other reaction(s): UNKNOWN  . Penicillins Rash    History reviewed. No pertinent family history.  Prior to Admission medications   Medication Sig Start Date End Date Taking? Authorizing Provider  acetaminophen (TYLENOL) 500 MG tablet Take 500 mg by mouth every 6 (six) hours as needed for fever or mild pain (discomfort).   Yes [provider]  alum & mag hydroxide-simeth (MAALOX/MYLANTA) 200-200-20 MG/5ML suspension  Take 30 mLs by mouth every 6 (six) hours as needed for indigestion.   Yes [provider]  amiodarone (PACERONE) 100 MG tablet Take 100 mg by mouth daily. 07/19/20 08/01/21 Yes [provider]  amiodarone (PACERONE) 100 MG tablet Take 300 mg by mouth See admin instructions. Bid x 13 days    Yes [provider]  apixaban (ELIQUIS) 5 MG TABS tablet Take 5-10 mg by mouth See admin instructions. '10mg'$  bid x 4 days, then '5mg'$  bid 07/19/20 10/21/21 Yes [provider]  ciprofloxacin (CIPRO) 500 MG tablet Take 500 mg by mouth See admin instructions. Bid x 7 days   Yes [provider]  furosemide (LASIX) 40 MG tablet Take 40 mg by mouth daily. 07/19/20 08/18/20 Yes [provider]  [facility]) OINT Apply 1 application topically See admin instructions. To buttocks every shift   Yes [provider]  loperamide (IMODIUM) 2 MG capsule Take 2 mg by mouth as needed (for loose stool after each loose stool **max 8 doses/24 hours**).   Yes [provider]  magnesium oxide (MAG-OX) 400 MG tablet Take 400 mg by mouth daily. 07/20/20 07/20/21 Yes [provider]  melatonin 3 MG TABS tablet Take 6 mg by mouth at bedtime. 07/19/20  Yes [provider]  metoprolol tartrate (LOPRESSOR) 25 MG tablet Take 12.5 mg by mouth in the morning and at bedtime. 07/19/20 08/18/20 Yes [provider]  Multiple Vitamins-Minerals (MULTIVITAMIN WITH MINERALS) tablet Take 1 tablet by mouth daily.   Yes [provider]  nitroGLYCERIN (NITROSTAT) 0.4 MG SL tablet Place 0.4 mg under the tongue every 5 (five) minutes as needed for chest pain. 04/18/20  Yes [provider]  Ostomy Supplies (SKIN PREP WIPES) MISC Apply 1 application topically See admin instructions. Apply to bilateral heels bid   Yes [provider]  OVER THE COUNTER MEDICATION Take 1 Dose by mouth in the morning and at bedtime. Med pass for nutritional support   Yes [provider]  spironolactone (ALDACTONE) 25 MG tablet Take 12.5 mg by mouth every Monday, Wednesday, and Friday.   Yes [provider]  OXYGEN Inhale 2 L into the lungs See admin instructions. Continuously  to maintain 02 sat>90% Patient not taking: No sig reported     [provider]    Physical Exam:  Constitutional: Frail elderly female who appears to be in some respiratory distress Vitals:   07/30/20 0449 07/30/20 0500 07/30/20 0624 07/30/20 0700  BP:  (!) 105/41 (!) 120/49 (!) 125/47  Pulse:  70 70 67  Resp:  '11 16 19  '$ Temp:      TempSrc:      SpO2:  97% 96% 98%  Weight: 71.2 kg     Height: '5\' 2"'$  (1.575 m)      Eyes: PERRL, lids and conjunctivae normal ENMT: Mucous membranes are dry. Posterior pharynx clear of any exudate or lesions.  Neck: normal, supple, no masses, no thyromegaly.  JVD appreciated Respiratory: Increased effort with rales appreciated and mild expiratory wheeze.  Patient currently on 2 L nasal cannula oxygen with O2 saturations maintained. Cardiovascular: Regular rate and rhythm.  At least 2+ pitting extremity edema. 2+ pedal pulses. No carotid bruits.  Abdomen: no tenderness, no masses palpated. No hepatosplenomegaly. Bowel sounds positive.  Musculoskeletal: no clubbing / cyanosis. No joint deformity upper and lower extremities. Good ROM, no contractures. Normal muscle tone.  Skin: no rashes, lesions, ulcers. No induration Neurologic: CN 2-12 grossly  intact. Sensation intact, DTR normal. Strength 5/5 in all 4.  Psychiatric: Normal judgment and insight. Alert and oriented x 3. Normal mood.     Labs on Admission: I have personally reviewed following labs and imaging studies  CBC: Recent Labs  Lab 07/30/20 0420 07/30/20 0557  WBC 7.8  --   NEUTROABS 6.1  --   HGB 11.4* 12.2  HCT 38.3 36.0  MCV 96.2  --   PLT 119*  --    Basic Metabolic Panel: Recent Labs  Lab 07/30/20 0420 07/30/20 0557  NA 136 136  K 3.7 3.7  CL 102 103  CO2 20*  --   GLUCOSE 285* 283*  BUN 20 29*  CREATININE 1.95* 1.90*  CALCIUM 9.1  --    GFR: Estimated Creatinine Clearance: 15.6 mL/min (A) (by C-G formula based on SCr of 1.9 mg/dL (H)). Liver Function Tests: Recent Labs  Lab 07/30/20 0420  AST 41  ALT 28  ALKPHOS 74   BILITOT 0.9  PROT 6.2*  ALBUMIN 3.1*   No results for input(s): LIPASE, AMYLASE in the last 168 hours. No results for input(s): AMMONIA in the last 168 hours. Coagulation Profile: No results for input(s): INR, PROTIME in the last 168 hours. Cardiac Enzymes: No results for input(s): CKTOTAL, CKMB, CKMBINDEX, TROPONINI in the last 168 hours. BNP (last 3 results) No results for input(s): PROBNP in the last 8760 hours. HbA1C: No results for input(s): HGBA1C in the last 72 hours. CBG: No results for input(s): GLUCAP in the last 168 hours. Lipid Profile: No results for input(s): CHOL, HDL, LDLCALC, TRIG, CHOLHDL, LDLDIRECT in the last 72 hours. Thyroid Function Tests: No results for input(s): TSH, T4TOTAL, FREET4, T3FREE, THYROIDAB in the last 72 hours. Anemia Panel: No results for input(s): VITAMINB12, FOLATE, FERRITIN, TIBC, IRON, RETICCTPCT in the last 72 hours. Urine analysis: No results found for: COLORURINE, APPEARANCEUR, LABSPEC, PHURINE, GLUCOSEU, HGBUR, BILIRUBINUR, KETONESUR, PROTEINUR, UROBILINOGEN, NITRITE, LEUKOCYTESUR Sepsis Labs: No results found for this or any previous visit (from the past 240 hour(s)).   Radiological Exams on Admission: DG Chest Portable 1 View  Result Date: 07/30/2020 CLINICAL DATA:  Shortness of breath.  Respiratory distress EXAM: PORTABLE CHEST 1 VIEW COMPARISON:  07/15/2020 FINDINGS: Chronic cardiomegaly. Hazy opacity at the bases primarily attributed to pleural effusion. There is superimposed pulmonary opacity that could be atelectasis or pneumonia. Upper lung markings are similar to before. No pneumothorax. IMPRESSION: Cardiomegaly, vascular congestion, and bilateral pleural effusion. Atelectasis or infiltrate may be superimposed at the lower lobes. Electronically Signed   By: Monte Fantasia M.D.   On: 07/30/2020 04:52    EKG: Independently reviewed.  Sinus rhythm at 78 bpm with first-degree heart block and QTC 509  Assessment/Plan Respiratory  failure with hypoxia secondary to combined systolic and diastolic congestive heart failure exacerbation: Acute on chronic.  Patient presents with complaints of shortness of breath while coming back from the bathroom this morning.  BNP elevated at 1149.9.  Chest x-ray significant for cardiomegaly with vascular congestion and bilateral effusions.  Last echocardiogram per care everywhere revealed EF of 30 to 35% with grade 2 diastolic dysfunction on [id number].  Patient had been given 40 mg of Lasix IV.  Patient with at least 2 admissions related with CHF/flash pulmonary edema over the last 2 months -Admit to a telemetry bed -Heart failure orders set  initiated  -Continuous pulse oximetry with nasal cannula oxygen as needed to keep O2 saturations >92%  -Strict I&Os and daily weights -Elevate lower extremities -  BiPAP as needed -Lasix 40 mg IV daily unless aggressive treatment recommended per cardiology given solitary kidney and CKD stage IV -Reintroduce metoprolol when able -Cardiology consulted to give any additional recommendations   Transient hypotension(acute) history of essential hypertension: On admission patient's diastolic blood pressures intermittently low with maps dropping less than 65.  Current blood pressure medications include metoprolol 12.5 mg twice daily, spironolactone 12.5 mg every M/W/F, and furosemide 40 mg daily. -Hold current oral blood pressure medications while IV diuresing -Try to reintroduce medications when medically appropriate  Coronary artery disease elevated troponin: Patient noted to have elevated high-sensitivity troponin of 29->50.  Elevated troponins also noted during previous admission.  At that time cardiac catheterization was not pursued due to  possibility of worsening kidney function.  Aspirin was also not started due to increased risk of bleeding.  Suspect secondary to demand related to congestive heart failure exacerbation signifying a type II MI. -Continue to  monitor  Acute kidney injury superimposed on chronic kidney disease stage IV: Patient presents with creatinine elevated up to 1.95 with BUN 20.  Son notes patient has solitary kidney.  Per review of records on care everywhere patient's baseline has been between 1.2-1.5.  Suspect secondary to hypoperfusion due to patient being fluid overloaded. -Avoid nephrotoxic agent -Continue to monitor kidney function daily with diuresis  Recent pneumonia due to COVID-19 infection suspect healthcare associated pneumonia: Patient had been diagnosed with COVID-19 on 1/16 per review of records in care everywhere.  She should no longer require airborne precautions as it has been over 1 month since initially testing positive.  Patient has been given.  Antibiotics of aztreonam and vancomycin initially for possible healthcare associated pneumonia. -Discontinued airborne precautions -Add on procalcitonin -Changed antibiotics to Rocephin and doxycycline for atypical coverage.  Urinary tract infection: Prior to arrival.  Patient has been diagnosed with a urinary tract infection on 2/18 and started on ciprofloxacin.  Patient noted to have possible effusion on ciprofloxacin. -Follow-up urinalysis and culture  Diabetes mellitus type II: On admission patient's glucose elevated up to 285.  Patient does not appear to be on any insulin at baseline. -Hypoglycemic Protocol -CBGs before every meal with very sensitive SSI -Adjust regimen as needed  Paroxysmal atrial fibrillation on chronic anticoagulation: Patient appears to be in sinus rhythm at this time.  Home regimen includes amiodarone 300 mg twice daily to change to 100 mg daily on 2/23 and Eliquis 5 mg twice daily. -Continue amiodarone -Reduce Eliquis dose to 2.5 mg bid due to AKI -Replace electrolytes as needed to maintain goal potassium 4 and magnesium 2 Prolonged QT interval: Acute.  On admission EKG revealed QTc 509. -Correct any electrolyte  abnormalities  Dementia: Patient son reports that she has some mild dementia and is very hard of hearing at baseline.  DNR: PTA  DVT prophylaxis: Eliquis Code Status:DNR Family Communication: Son updated over the phone Disposition Plan: Hopefully discharge back to skilled nursing facility once medically stable Consults called: Cardiology Admission status: Inpatient, require more than 2 midnight stay  Norval Morton MD [facility]   If 7PM-7AM, please contact night-coverage   07/30/2020, 7:16 AM

## 2020-07-31 ENCOUNTER — Inpatient Hospital Stay (HOSPITAL_COMMUNITY): Payer: Medicare Other

## 2020-07-31 DIAGNOSIS — I5043 Acute on chronic combined systolic (congestive) and diastolic (congestive) heart failure: Secondary | ICD-10-CM | POA: Diagnosis not present

## 2020-07-31 LAB — BASIC METABOLIC PANEL
Anion gap: 11 (ref 5–15)
BUN: 24 mg/dL — ABNORMAL HIGH (ref 8–23)
CO2: 23 mmol/L (ref 22–32)
Calcium: 9.3 mg/dL (ref 8.9–10.3)
Chloride: 105 mmol/L (ref 98–111)
Creatinine, Ser: 2.02 mg/dL — ABNORMAL HIGH (ref 0.44–1.00)
GFR, Estimated: 22 mL/min — ABNORMAL LOW (ref >60)
Glucose, Bld: 104 mg/dL — ABNORMAL HIGH (ref 70–99)
Potassium: 3.7 mmol/L (ref 3.5–5.1)
Sodium: 139 mmol/L (ref 135–145)

## 2020-07-31 LAB — GLUCOSE, CAPILLARY
Glucose-Capillary: 98 mg/dL (ref 70–99)
Glucose-Capillary: 101 mg/dL — ABNORMAL HIGH (ref 70–99)
Glucose-Capillary: 114 mg/dL — ABNORMAL HIGH (ref 70–99)
Glucose-Capillary: 135 mg/dL — ABNORMAL HIGH (ref 70–99)

## 2020-07-31 LAB — CBC
HCT: 31.2 % — ABNORMAL LOW (ref 36.0–46.0)
Hemoglobin: 10.2 g/dL — ABNORMAL LOW (ref 12.0–15.0)
MCH: 29.3 pg (ref 26.0–34.0)
MCHC: 32.7 g/dL (ref 30.0–36.0)
MCV: 89.7 fL (ref 80.0–100.0)
Platelets: 109 10*3/uL — ABNORMAL LOW (ref 150–400)
RBC: 3.48 MIL/uL — ABNORMAL LOW (ref 3.87–5.11)
RDW: 14.6 % (ref 11.5–15.5)
WBC: 4.6 10*3/uL (ref 4.0–10.5)
nRBC: 0 % (ref 0.0–0.2)

## 2020-07-31 LAB — PROCALCITONIN: Procalcitonin: 0.8 ng/mL

## 2020-07-31 MED ORDER — PANTOPRAZOLE SODIUM 40 MG PO TBEC
40.0000 mg | DELAYED_RELEASE_TABLET | Freq: Every day | ORAL | Status: DC
  Administered 2020-07-31 – 2020-08-02 (×3): 40 mg via ORAL
  Filled 2020-07-31 (×3): qty 1

## 2020-07-31 MED ORDER — POTASSIUM CHLORIDE CRYS ER 20 MEQ PO TBCR
20.0000 meq | EXTENDED_RELEASE_TABLET | Freq: Two times a day (BID) | ORAL | Status: DC
  Administered 2020-07-31 – 2020-08-01 (×3): 20 meq via ORAL
  Filled 2020-07-31 (×2): qty 1

## 2020-07-31 MED ORDER — POTASSIUM CHLORIDE CRYS ER 20 MEQ PO TBCR
40.0000 meq | EXTENDED_RELEASE_TABLET | ORAL | Status: AC
  Administered 2020-07-31: 40 meq via ORAL
  Filled 2020-07-31: qty 2

## 2020-07-31 NOTE — Progress Notes (Signed)
PROGRESS NOTE                                                                                                                                                                                                             Patient Demographics:    Kristin Adkins, is a 87 y.o. female, DOB - 1920-06-12, [id number]  Outpatient Primary MD for the patient is Jene Every, MD    LOS - 1  Admit date - 07/30/2020    Chief Complaint  Patient presents with  . Shortness of Breath       Brief Narrative (HPI from H&P) - Kristin Adkins is a 87 y.o. 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   Subjective:    Kristin Adkins today has, No headache, No chest pain, No abdominal pain, no SOB - confused.   Assessment  & Plan :     1. Acute Hypoxic Resp. Failure due to Acute on Chr. Combined systolic and diastolic CHF EF [id number] - she is currently on IV Lasix along with potassium supplementation, cannot use ACE/ARB, Entresto or beta-blocker due to renal dysfunction and soft blood pressure.  Continue to monitor may require higher dose of Lasix upon discharge as according to the son patient has had recurrent CHF exacerbations requiring admission at [facility], recent echo in care everywhere from February 2022 shows EF of 35%.  Encouraged the patient to sit up in chair in the daytime use I-S and flutter valve for pulmonary toiletry and then prone in bed when at night.  Will advance activity and titrate down oxygen as possible.   2.  Paroxysmal atrial fibrillation [place] vas 2 score of greater than 3.  On amiodarone and Eliquis.   Continue  3.  Pneumonia.  No acute issue.  4.  UTI.  On Rocephin this was diagnosed prior to this admission.  5.  Underlying dementia, deconditioning, advanced age.  At risk for delirium, minimize narcotics and benzodiazepines, supportive care, PT-OT, may require SNF placement rather than ALF.  6.  Mild non-ACS pattern troponin rise due to demand ischemia  from CHF, no acute issue, continue to monitor.   7.  AKI on CKD 4.  Baseline creatinine 1.5.diurese and monitor.    8. DM type II.  On SSI.  Lab Results  Component Value Date   HGBA1C 5.2 07/30/2020    CBG (last 3)  Recent Labs    07/30/20 2137 07/31/20 0603 07/31/20 1052  GLUCAP 111* 98 101*         Condition -  Guarded  Family Communication  : Kristin Adkins [phone] 07/31/20  Code Status :  DNR  Consults  :  None  PUD Prophylaxis : PPI    Procedures  :            Disposition Plan  :    Status is: Inpatient  Remains inpatient appropriate because:IV treatments appropriate due to intensity of illness or inability to take PO  Dispo: The patient is from: SNF              Anticipated d/c is to: SNF              Anticipated d/c date is: 3 days              Patient currently is not medically stable to d/c.   Difficult to place patient No  DVT Prophylaxis  :  Eliquis  Lab Results  Component Value Date   PLT 109 (L) 07/31/2020    Diet :  Diet Order            Diet heart healthy/carb modified Room service appropriate? Yes; Fluid consistency: Thin  Diet effective now                  Inpatient Medications  Scheduled Meds: . [START ON 08/02/2020] amiodarone  100 mg Oral Daily  . amiodarone  300 mg Oral BID  . apixaban  2.5 mg Oral BID  . furosemide  40 mg Intravenous BID  . insulin aspart  0-6 Units Subcutaneous TID WC  . melatonin  6 mg Oral QHS  . multivitamin with minerals  1 tablet Oral Daily  . pantoprazole  40 mg Oral Daily  . potassium chloride  20 mEq Oral BID  . potassium chloride   40 mEq Oral STAT  . sodium chloride flush  3 mL Intravenous Q12H   Continuous Infusions: . sodium chloride    . cefTRIAXone (ROCEPHIN)  IV 1 g (07/30/20 2143)  . doxycycline (VIBRAMYCIN) IV 100 mg (07/31/20 0822)   PRN Meds:.sodium chloride, acetaminophen, albuterol, sodium chloride flush  Antibiotics  :    Anti-infectives (From admission, onward)   Start     Dose/Rate Route Frequency Ordered Stop   07/30/20 2200  cefTRIAXone (ROCEPHIN) 1 g in sodium chloride 0.9 % 100 mL IVPB        1 g 200 mL/hr over 30 Minutes Intravenous Every 24 hours 07/30/20 0843 08/04/20 2159   07/30/20 1200  doxycycline (VIBRAMYCIN) 100 mg in sodium chloride 0.9 % 250 mL IVPB        100 mg 125 mL/hr over 120 Minutes Intravenous 2 times daily 07/30/20 1100     07/30/20 0845  cefTRIAXone (ROCEPHIN) 1 g in sodium chloride 0.9 % 100 mL IVPB  Status:  Discontinued        1 g 200 mL/hr over 30 Minutes Intravenous Every 24 hours 07/30/20 0842 07/30/20 0843   07/30/20 0545  vancomycin (VANCOREADY) IVPB 1000 mg/200 mL        1,000 mg 200 mL/hr  over 60 Minutes Intravenous  Once 07/30/20 0506 07/30/20 0723   07/30/20 0515  aztreonam (AZACTAM) 2 g in sodium chloride 0.9 % 100 mL IVPB        2 g 200 mL/hr over 30 Minutes Intravenous  Once 07/30/20 0506 07/30/20 0835       Time Spent in minutes  30   Lala Lund M.D on 07/31/2020 at 11:18 AM  To page go to [email or web address]   [facility] -  Office  [phone]    See all Orders from today for further details    Objective:   Vitals:   07/30/20 2257 07/31/20 0300 07/31/20 0746 07/31/20 1049  BP: (!) 129/46 (!) 131/50 135/90 108/89  Pulse: 82 74 72 73  Resp: '20 15 19 20  '$ Temp: 98 F (36.7 C) 98.1 F (36.7 C) 97.9 F (36.6 C) 98.1 F (36.7 C)  TempSrc: Oral Oral Oral Oral  SpO2: 98% 95%    Weight: 77 kg     Height:        Wt Readings from Last 3 Encounters:  07/30/20 77 kg     Intake/Output Summary (Last 24 hours) at 07/31/2020  1118 Last data filed at 07/30/2020 1933 Gross per 24 hour  Intake 281.56 ml  Output 450 ml  Net -168.44 ml     Physical Exam  Awake but mildly confused with no focal deficits [place].AT,PERRAL Supple Neck,No JVD, No cervical lymphadenopathy appriciated.  Symmetrical Chest wall movement, Good air movement bilaterally, few rales RRR,No Gallops,Rubs or new Murmurs, No Parasternal Heave +ve B.Sounds, Abd Soft, No tenderness, No organomegaly appriciated, No rebound - guarding or rigidity. No Cyanosis, trace edema    Data Review:    CBC Recent Labs  Lab 07/30/20 0420 07/30/20 0557 07/31/20 0033  WBC 7.8  --  4.6  HGB 11.4* 12.2 10.2*  HCT 38.3 36.0 31.2*  PLT 119*  --  109*  MCV 96.2  --  89.7  MCH 28.6  --  29.3  MCHC 29.8*  --  32.7  RDW 14.4  --  14.6  LYMPHSABS 1.3  --   --   MONOABS 0.3  --   --   EOSABS 0.0  --   --   BASOSABS 0.0  --   --     Recent Labs  Lab 07/30/20 0420 07/30/20 0557 07/30/20 0909 07/30/20 0910 07/31/20 0033  NA 136 136  --   --  139  K 3.7 3.7  --   --  3.7  CL 102 103  --   --  105  CO2 20*  --   --   --  23  GLUCOSE 285* 283*  --   --  104*  BUN 20 29*  --   --  24*  CREATININE 1.95* 1.90*  --   --  2.02*  CALCIUM 9.1  --   --   --  9.3  AST 41  --   --   --   --   ALT 28  --   --   --   --   ALKPHOS 74  --   --   --   --   BILITOT 0.9  --   --   --   --   ALBUMIN 3.1*  --   --   --   --   MG  --   --  2.0  --   --   PROCALCITON  --   --  0.37  --   --  HGBA1C  --   --   --  5.2  --   BNP 1,149.9*  --   --   --   --     ------------------------------------------------------------------------------------------------------------------ No results for input(s): CHOL, HDL, LDLCALC, TRIG, CHOLHDL, LDLDIRECT in the last 72 hours.  Lab Results  Component Value Date   HGBA1C 5.2 07/30/2020   ------------------------------------------------------------------------------------------------------------------ No results for input(s):  TSH, T4TOTAL, T3FREE, THYROIDAB in the last 72 hours.  Invalid input(s): FREET3  Cardiac Enzymes No results for input(s): CKMB, TROPONINI, MYOGLOBIN in the last 168 hours.  Invalid input(s): CK ------------------------------------------------------------------------------------------------------------------    Component Value Date/Time   BNP 1,149.9 (H) 07/30/2020 0420    Micro Results Recent Results (from the past 240 hour(s))  Blood culture (routine x 2)     Status: None (Preliminary result)   Collection Time: 07/30/20  6:06 AM   Specimen: BLOOD LEFT HAND  Result Value Ref Range Status   Specimen Description BLOOD LEFT HAND  Final   Special Requests   Final    BOTTLES DRAWN AEROBIC ONLY Blood Culture adequate volume   Culture   Final    NO GROWTH 1 DAY Performed at [facility], [place] [address], [place], [place] [address]    Report Status PENDING  Incomplete  Blood culture (routine x 2)     Status: None (Preliminary result)   Collection Time: 07/30/20  6:08 AM   Specimen: BLOOD RIGHT HAND  Result Value Ref Range Status   Specimen Description BLOOD RIGHT HAND  Final   Special Requests   Final    BOTTLES DRAWN AEROBIC ONLY Blood Culture results may not be optimal due to an inadequate volume of blood received in culture bottles   Culture   Final    NO GROWTH 1 DAY Performed at [facility], [place] [address] [place], [place] [address]    Report Status PENDING  Incomplete  Resp Panel by RT-PCR (Flu A&B, Covid) Nasopharyngeal Swab     Status: None   Collection Time: 07/30/20  6:30 AM   Specimen: Nasopharyngeal Swab; Nasopharyngeal(NP) swabs in vial transport medium  Result Value Ref Range Status   SARS Coronavirus 2 by RT PCR NEGATIVE NEGATIVE Final    Comment: (NOTE) SARS-CoV-2 target nucleic acids are NOT DETECTED.  The SARS-CoV-2 RNA is generally detectable in upper respiratory specimens during the acute phase of infection. The lowest concentration of  SARS-CoV-2 viral copies this assay can detect is 138 copies/mL. A negative result does not preclude SARS-Cov-2 infection and should not be used as the sole basis for treatment or other patient management decisions. A negative result may occur with  improper specimen collection/handling, submission of specimen other than nasopharyngeal swab, presence of viral mutation(s) within the areas targeted by this assay, and inadequate number of viral copies(<138 copies/mL). A negative result must be combined with clinical observations, patient history, and epidemiological information. The expected result is Negative.  Fact Sheet for Patients:  EntrepreneurPulse.com.au  Fact Sheet for Healthcare Providers:  IncredibleEmployment.be  This test is no t yet approved or cleared by the [place] FDA and  has been authorized for detection and/or diagnosis of SARS-CoV-2 by FDA under an Emergency Use Authorization (EUA). This EUA will remain  in effect (meaning this test can be used) for the duration of the COVID-19 declaration under Section 564(b)(1) of the Act, 21 U.S.C.section 360bbb-3(b)(1), unless the authorization is terminated  or revoked sooner.       Influenza A by  PCR NEGATIVE NEGATIVE Final   Influenza B by PCR NEGATIVE NEGATIVE Final    Comment: (NOTE) The Xpert Xpress SARS-CoV-2/FLU/RSV plus assay is intended as an aid in the diagnosis of influenza from Nasopharyngeal swab specimens and should not be used as a sole basis for treatment. Nasal washings and aspirates are unacceptable for Xpert Xpress SARS-CoV-2/FLU/RSV testing.  Fact Sheet for Patients: EntrepreneurPulse.com.au  Fact Sheet for Healthcare Providers: IncredibleEmployment.be  This test is not yet approved or cleared by the [place] FDA and has been authorized for detection and/or diagnosis of SARS-CoV-2 by FDA under an Emergency Use  Authorization (EUA). This EUA will remain in effect (meaning this test can be used) for the duration of the COVID-19 declaration under Section 564(b)(1) of the Act, 21 U.S.C. section 360bbb-3(b)(1), unless the authorization is terminated or revoked.  Performed at [facility], [place] [address], [place], [place] [address]   Culture, Urine     Status: None (Preliminary result)   Collection Time: 07/30/20  8:32 AM   Specimen: Urine, Random  Result Value Ref Range Status   Specimen Description URINE, RANDOM  Final   Special Requests NONE  Final   Culture   Final    CULTURE REINCUBATED FOR BETTER GROWTH Performed at [facility], [place] [address] [place], [place] [address]    Report Status PENDING  Incomplete    Radiology Reports DG Chest Port 1 View  Result Date: 07/31/2020 CLINICAL DATA:  Shortness of breath EXAM: PORTABLE CHEST 1 VIEW COMPARISON:  Yesterday FINDINGS: Right more than left pleural effusion and adjacent pulmonary opacity, presumably atelectasis based on January 2022 chest CT. Cardiomegaly. Negative for pneumothorax or convincing edema. IMPRESSION: Right more than left pleural effusion. Increased chest opacity from before may be from differences in positioning and layering. Electronically Signed   By: Monte Fantasia M.D.   On: 07/31/2020 08:11   DG Chest Portable 1 View  Result Date: 07/30/2020 CLINICAL DATA:  Shortness of breath.  Respiratory distress EXAM: PORTABLE CHEST 1 VIEW COMPARISON:  07/15/2020 FINDINGS: Chronic cardiomegaly. Hazy opacity at the bases primarily attributed to pleural effusion. There is superimposed pulmonary opacity that could be atelectasis or pneumonia. Upper lung markings are similar to before. No pneumothorax. IMPRESSION: Cardiomegaly, vascular congestion, and bilateral pleural effusion. Atelectasis or infiltrate may be superimposed at the lower lobes. Electronically Signed   By: Monte Fantasia M.D.   On: 07/30/2020 04:52

## 2020-07-31 NOTE — Evaluation (Addendum)
Physical Therapy Evaluation Patient Details Name: Kristin Adkins MRN: [id number] DOB: 1920-08-15 Today's Date: 07/31/2020   History of Present Illness  87 y.o. 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 Presents 07/30/20 with complaints of shortness of breath. Initially placed on BiPAP, chest xray revealed cardiomegaly, vascular congestion, and bilateral pleural effusions. Admitted for treatment of Respiratory failure with hypoxia secondary to combined systolic and diastolic congestive heart failure exacerbation, and transient hypotension.  Clinical Impression  Pt is pleasantly confused and answers "I don't know" to all questions except her name and the fact that at one point she lived alone. Per notes pt was brought to ED from [place] in [place]. Pt currently limited by her decreased cognition, generalized weakness, decrease balance, and endurance. Pt is min guard for transfers and min A for ambulation of 18 feet with RW. PT recommends that pt return to her SNF facility for rehab at discharge. PT will continue to follow acutely.     Follow Up Recommendations SNF;Supervision/Assistance - 24 hour    Equipment Recommendations  None recommended by PT (has necessary equipment)    Recommendations for Other Services OT consult     Precautions / Restrictions Precautions Precautions: Fall Precaution Comments: bruising indicative of falls Restrictions Weight Bearing Restrictions: No      Mobility  Bed Mobility               General bed mobility comments: OOB in recliner    Transfers Overall transfer level: Needs  assistance Equipment used: Rolling walker (2 wheeled) Transfers: Sit to/from [facility] Sit to Stand: Min guard Stand pivot transfers: Min guard       General transfer comment: min guard for safety for stand pivot <>BSC and min guard for power up from recliner with use of armrests  Ambulation/Gait Ambulation/Gait assistance: Min assist Gait Distance (Feet): 18 Feet Assistive device: Rolling walker (2 wheeled) Gait Pattern/deviations: Step-through pattern;Decreased step length - right;Decreased step length - left;Shuffle;Trunk flexed Gait velocity: slowed Gait velocity interpretation: <1.31 ft/sec, indicative of household ambulator General Gait Details: contact guard assist for ambulation in room with RW, unsteady but no LoB, vc for proximity to RW and upright posture         Balance Overall balance assessment: Needs assistance Sitting-balance support: Feet supported;No upper extremity supported Sitting balance-Leahy Scale: Fair     Standing balance support: Bilateral upper extremity supported;Single extremity supported;During functional activity Standing balance-Leahy Scale: Poor Standing balance comment: requires at least single UE support                             Pertinent Vitals/Pain Pain Assessment: Faces Faces Pain Scale: Hurts a little bit Pain Location: IV site on L arm Pain Descriptors / Indicators: Grimacing;Guarding Pain Intervention(s): Monitored during session;Repositioned;Other (comment) (informed RN that likely will pull IV due to discomfort)    Home Living Family/patient expects to be discharged to:: Skilled nursing facility                 Additional Comments: unclear how long she has been at SNF    Prior Function Level of Independence: Needs assistance   Gait / Transfers Assistance Needed:  ambulates with RW  ADL's / Homemaking Assistance Needed: needs assist with ADLs  Comments: requires assist with pericare  during session     Hand Dominance   Dominant Hand: Right    Extremity/Trunk Assessment   Upper Extremity Assessment Upper Extremity Assessment: Generalized weakness;Difficult to assess due to impaired cognition    Lower Extremity Assessment Lower Extremity Assessment: Generalized weakness;Difficult to assess due to impaired cognition       Communication   Communication: HOH  Cognition Arousal/Alertness: Awake/alert Behavior During Therapy: Flat affect;WFL for tasks assessed/performed Overall Cognitive Status: History of cognitive impairments - at baseline                                 General Comments: Oriented to self, unsure how long since she has been living alone, very poor short term memory however follows commands easily      General Comments General comments (skin integrity, edema, etc.): Pt on 2L O2 via [place], pt with c/o of SoB with ambulation, and pt with increased work of breathing however SaO2 on 2L O2 remained 93-98%O2, HR in 70s with ambulation. skin tear on R arm        Assessment/Plan    PT Assessment Patient needs continued PT services  PT Problem List Decreased strength;Decreased activity tolerance;Decreased balance;Decreased mobility;Decreased coordination;Decreased cognition;Decreased safety awareness;Cardiopulmonary status limiting activity;Decreased skin integrity       PT Treatment Interventions DME instruction;Gait training;Functional mobility training;Therapeutic activities;Therapeutic exercise;Balance training;Cognitive remediation;Patient/family education    PT Goals (Current goals can be found in the Care Plan section)  Acute Rehab PT Goals Patient Stated Goal: go home PT Goal Formulation: Patient unable to participate in goal setting Time For Goal Achievement: 08/14/20 Potential to Achieve Goals: Fair    Frequency Min 2X/week    AM-PAC PT "6 Clicks" Mobility  Outcome Measure Help needed turning from your back to your  side while in a flat bed without using bedrails?: None Help needed moving from lying on your back to sitting on the side of a flat bed without using bedrails?: None Help needed moving to and from a bed to a chair (including a wheelchair)?: A Little Help needed standing up from a chair using your arms (e.g., wheelchair or bedside chair)?: A Little Help needed to walk in hospital room?: A Little Help needed climbing 3-5 steps with a railing? : A Lot 6 Click Score: 19    End of Session Equipment Utilized During Treatment: Gait belt Activity Tolerance: Patient tolerated treatment well Patient left: in chair;with call bell/phone within reach;with chair alarm set Nurse Communication: Mobility status;Other (comment) (pt picking at IV) PT Visit Diagnosis: Unsteadiness on feet (R26.81);Other abnormalities of gait and mobility (R26.89);History of falling (Z91.81);Muscle weakness (generalized) (M62.81);Difficulty in walking, not elsewhere classified (R26.2)    Time: [id number] PT Time Calculation (min) (ACUTE ONLY): 34 min   Charges:   PT Evaluation $PT Eval Moderate Complexity: 1 Mod PT Treatments $Therapeutic Activity: 8-22 mins        Elizabeth B. Migdalia Dk PT, DPT Acute Rehabilitation Services Pager [phone] Office [phone]   [place] 07/31/2020, 10:07 AM

## 2020-07-31 NOTE — Progress Notes (Signed)
Progress Note  Patient Name: Kristin Adkins Date of Encounter: 07/31/2020  [facility] Cardiologist: new , Darcey Demma   Subjective   87 yo with acute on chronic combined CHF, recent covid pneumonia, CKD admitted with respiratory failure , hypoxemia   Has been diuresed. Creatinine is up to 2.02 She has diuresed quite a bit this am ( not yet recorded in I/O)  Potassium is 3.7  Feeling better .     Inpatient Medications    Scheduled Meds: . [START ON 08/02/2020] amiodarone  100 mg Oral Daily  . amiodarone  300 mg Oral BID  . apixaban  2.5 mg Oral BID  . furosemide  40 mg Intravenous BID  . insulin aspart  0-6 Units Subcutaneous TID WC  . melatonin  6 mg Oral QHS  . multivitamin with minerals  1 tablet Oral Daily  . sodium chloride flush  3 mL Intravenous Q12H   Continuous Infusions: . sodium chloride    . cefTRIAXone (ROCEPHIN)  IV 1 g (07/30/20 2143)  . doxycycline (VIBRAMYCIN) IV Stopped (07/30/20 1617)   PRN Meds: sodium chloride, acetaminophen, albuterol, sodium chloride flush   Vital Signs    Vitals:   07/30/20 1928 07/30/20 2257 07/31/20 0300 07/31/20 0746  BP: 109/88 (!) 129/46 (!) 131/50 135/90  Pulse: 76 82 74 72  Resp: '18 20 15 19  '$ Temp: 97.9 F (36.6 C) 98 F (36.7 C) 98.1 F (36.7 C) 97.9 F (36.6 C)  TempSrc: Oral Oral Oral Oral  SpO2: 95% 98% 95%   Weight:  77 kg    Height:        Intake/Output Summary (Last 24 hours) at 07/31/2020 0813 Last data filed at 07/30/2020 1933 Gross per 24 hour  Intake 383.24 ml  Output 450 ml  Net -66.76 ml   Last 3 Weights 07/30/2020 07/30/2020 07/30/2020  Weight (lbs) 169 lb 12.1 oz 156 lb 15.5 oz 157 lb  Weight (kg) 77 kg 71.2 kg 71.215 kg      Telemetry    NSR  - Personally Reviewed  ECG    nsr  - Personally Reviewed  Physical Exam   GEN: elderly female, still very spry .    Neck: No JVD Cardiac:  RR, soft systolic murmur .  Respiratory: reduced breath sounds in right base.  GI: Soft,  nontender, non-distended  MS: No edema; No deformity.  Neuro:  Nonfocal  Psych: very hard of hearing   Labs    High Sensitivity Troponin:   Recent Labs  Lab 07/30/20 0420 07/30/20 0615  TROPONINIHS 29* 50*      Chemistry Recent Labs  Lab 07/30/20 0420 07/30/20 0557 07/31/20 0033  NA 136 136 139  K 3.7 3.7 3.7  CL 102 103 105  CO2 20*  --  23  GLUCOSE 285* 283* 104*  BUN 20 29* 24*  CREATININE 1.95* 1.90* 2.02*  CALCIUM 9.1  --  9.3  PROT 6.2*  --   --   ALBUMIN 3.1*  --   --   AST 41  --   --   ALT 28  --   --   ALKPHOS 74  --   --   BILITOT 0.9  --   --   GFRNONAA 23*  --  22*  ANIONGAP 14  --  11     Hematology Recent Labs  Lab 07/30/20 0420 07/30/20 0557 07/31/20 0033  WBC 7.8  --  4.6  RBC 3.98  --  3.48*  HGB  11.4* 12.2 10.2*  HCT 38.3 36.0 31.2*  MCV 96.2  --  89.7  MCH 28.6  --  29.3  MCHC 29.8*  --  32.7  RDW 14.4  --  14.6  PLT 119*  --  109*    BNP Recent Labs  Lab 07/30/20 0420  BNP 1,149.9*     DDimer No results for input(s): DDIMER in the last 168 hours.   Radiology    DG Chest Portable 1 View  Result Date: 07/30/2020 CLINICAL DATA:  Shortness of breath.  Respiratory distress EXAM: PORTABLE CHEST 1 VIEW COMPARISON:  07/15/2020 FINDINGS: Chronic cardiomegaly. Hazy opacity at the bases primarily attributed to pleural effusion. There is superimposed pulmonary opacity that could be atelectasis or pneumonia. Upper lung markings are similar to before. No pneumothorax. IMPRESSION: Cardiomegaly, vascular congestion, and bilateral pleural effusion. Atelectasis or infiltrate may be superimposed at the lower lobes. Electronically Signed   By: Monte Fantasia M.D.   On: 07/30/2020 04:52    Cardiac Studies      Patient Profile     87 y.o. female   [place]    1.   Acute on chronic combined systolic and diastolic congestive heart failure: Patient presents with hypoxemia and respiratory failure.  He was markedly volume  overloaded.  [facility]ch[facility]ogram at [facility] reveals moderately depressed left ventricular systolic and diastolic function with EF 30-35%. She was also found to have moderate pulmonary hypertension with an estimated PA pressure of 50.  She has  diuresed nicely.  Continue with current dose of Lasix.  I will supplement her potassium.  2.  Acute on chronic renal insufficiency: Further plans per the internal medicine team.       For questions or updates, please contact [place] Please consult [email or web address] for contact info under        Signed, Mertie Moores, MD  07/31/2020, 8:13 AM

## 2020-07-31 NOTE — Evaluation (Signed)
Occupational Therapy Evaluation and Discharge Patient Details Name: Kristin Adkins MRN: [id number] DOB: March 13, 1921 Today's Date: 07/31/2020    History of Present Illness 87 y.o. 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 Presents 07/30/20 with complaints of shortness of breath. Initially placed on BiPAP, chest xray revealed cardiomegaly, vascular congestion, and bilateral pleural effusions. Admitted for treatment of Respiratory failure with hypoxia secondary to combined systolic and diastolic congestive heart failure exacerbation, and transient hypotension.   Clinical Impression   Pt functioning at her baseline in ADL and mobility. She lives in an ALF, but was receiving rehab and nursing care a [facility] prior to this admission. No acute OT needs.     Follow Up Recommendations  SNF    Equipment Recommendations       Recommendations for Other Services       Precautions / Restrictions Precautions Precautions: Fall      Mobility Bed Mobility               General bed mobility comments: OOB in recliner    Transfers Overall transfer level: Needs assistance Equipment used: Rolling walker (2 wheeled) Transfers: Sit to/from [facility] Sit to Stand: Min guard Stand pivot transfers: Min guard            Balance Overall balance assessment: Needs assistance   Sitting balance-Leahy Scale: Fair     Standing balance support: Bilateral upper extremity supported;Single extremity supported;During functional activity Standing balance-Leahy Scale: Poor                             ADL  either performed or assessed with clinical judgement   ADL Overall ADL's : At baseline                                             Vision Baseline Vision/History: Wears glasses Wears Glasses: At all times Patient Visual Report: No change from baseline       Perception     Praxis      Pertinent Vitals/Pain Pain Assessment: No/denies pain     Hand Dominance Right   Extremity/Trunk Assessment Upper Extremity Assessment Upper Extremity Assessment: Generalized weakness   Lower Extremity Assessment Lower Extremity Assessment: Defer to PT evaluation       Communication Communication Communication: HOH (hearing aids are lost)   Cognition Arousal/Alertness: Awake/alert Behavior During Therapy: WFL for tasks assessed/performed Overall Cognitive Status: History of cognitive impairments - at baseline                                 General Comments: poor memory   General Comments       Exercises     Shoulder Instructions      Home Living Family/patient expects to be discharged to:: Skilled nursing facility                                 Additional Comments:  pt receiving rehab at [facility], her home is [place] [facility]      Prior Functioning/Environment Level of Independence: Needs assistance  Gait / Transfers Assistance Needed: ambulates with RW and supervision ADL's / Homemaking Assistance Needed: self feeds, assisted for toileting, bathing and dressing            OT Problem List: Decreased activity tolerance;Impaired balance (sitting and/or standing)      OT Treatment/Interventions:      OT Goals(Current goals can be found in the care plan section) Acute Rehab OT Goals Patient Stated Goal: return to [facility] if necessary or back to ALF  OT Frequency:     Barriers to D/C:            Co-evaluation              AM-PAC OT "6 Clicks" Daily Activity     Outcome Measure Help from another person eating  meals?: None Help from another person taking care of personal grooming?: A Little Help from another person toileting, which includes using toliet, bedpan, or urinal?: Total Help from another person bathing (including washing, rinsing, drying)?: A Lot Help from another person to put on and taking off regular upper body clothing?: A Lot Help from another person to put on and taking off regular lower body clothing?: Total 6 Click Score: 13   End of Session Equipment Utilized During Treatment: Gait belt;Rolling walker;Oxygen (2L)  Activity Tolerance: Patient tolerated treatment well Patient left: in chair;with call bell/phone within reach;with chair alarm set  OT Visit Diagnosis: Muscle weakness (generalized) (M62.81);Other symptoms and signs involving cognitive function;Unsteadiness on feet (R26.81);Other abnormalities of gait and mobility (R26.89)                Time: [id number] OT Time Calculation (min): 22 min Charges:  OT General Charges $OT Visit: 1 Visit OT Evaluation $OT Eval Moderate Complexity: 1 Mod  Nestor Lewandowsky, OTR/L Acute Rehabilitation Services Pager: [phone] Office: [phone]  Malka So 07/31/2020, 2:29 PM

## 2020-07-31 NOTE — Discharge Instructions (Signed)
Information on my medicine - ELIQUIS (apixaban)  Why was Eliquis prescribed for you? Eliquis was prescribed for you to reduce the risk of a blood clot forming that can cause a stroke if you have a medical condition called atrial fibrillation (a type of irregular heartbeat).  What do You need to know about Eliquis ? Take your Eliquis TWICE DAILY - one 2.'5mg'$  tablet in the morning and one 2.'5mg'$  tablet in the evening with or without food. If you have difficulty swallowing the tablet whole please discuss with your pharmacist how to take the medication safely.  Take Eliquis exactly as prescribed by your doctor and DO NOT stop taking Eliquis without talking to the doctor who prescribed the medication.  Stopping may increase your risk of developing a stroke.  Refill your prescription before you run out.  After discharge, you should have regular check-up appointments with your healthcare provider that is prescribing your Eliquis.  In the future your dose may need to be changed if your kidney function or weight changes by a significant amount or as you get older.  What do you do if you miss a dose? If you miss a dose, take it as soon as you remember on the same day and resume taking twice daily.  Do not take more than one dose of ELIQUIS at the same time to make up a missed dose.  Important Safety Information A possible side effect of Eliquis is bleeding. You should call your healthcare provider right away if you experience any of the following: ? Bleeding from an injury or your nose that does not stop. ? Unusual colored urine (red or dark brown) or unusual colored stools (red or black). ? Unusual bruising for unknown reasons. ? A serious fall or if you hit your head (even if there is no bleeding).  Some medicines may interact with Eliquis and might increase your risk of bleeding or clotting while on Eliquis. To help avoid this, consult your healthcare provider or pharmacist prior to using any  new prescription or non-prescription medications, including herbals, vitamins, non-steroidal anti-inflammatory drugs (NSAIDs) and supplements.  This website has more information on Eliquis (apixaban): http://[email or web address].com/eliquis/home

## 2020-08-01 DIAGNOSIS — I5043 Acute on chronic combined systolic (congestive) and diastolic (congestive) heart failure: Secondary | ICD-10-CM | POA: Diagnosis not present

## 2020-08-01 DIAGNOSIS — I480 Paroxysmal atrial fibrillation: Secondary | ICD-10-CM | POA: Diagnosis not present

## 2020-08-01 LAB — COMPREHENSIVE METABOLIC PANEL
ALT: 24 U/L (ref 0–44)
AST: 26 U/L (ref 15–41)
Albumin: 2.9 g/dL — ABNORMAL LOW (ref 3.5–5.0)
Alkaline Phosphatase: 68 U/L (ref 38–126)
Anion gap: 10 (ref 5–15)
BUN: 23 mg/dL (ref 8–23)
CO2: 25 mmol/L (ref 22–32)
Calcium: 9.3 mg/dL (ref 8.9–10.3)
Chloride: 105 mmol/L (ref 98–111)
Creatinine, Ser: 1.91 mg/dL — ABNORMAL HIGH (ref 0.44–1.00)
GFR, Estimated: 24 mL/min — ABNORMAL LOW (ref >60)
Glucose, Bld: 125 mg/dL — ABNORMAL HIGH (ref 70–99)
Potassium: 4.1 mmol/L (ref 3.5–5.1)
Sodium: 140 mmol/L (ref 135–145)
Total Bilirubin: 0.9 mg/dL (ref 0.3–1.2)
Total Protein: 5.9 g/dL — ABNORMAL LOW (ref 6.5–8.1)

## 2020-08-01 LAB — CBC WITH DIFFERENTIAL/PLATELET
Abs Immature Granulocytes: 0.04 10*3/uL (ref 0.00–0.07)
Basophils Absolute: 0 10*3/uL (ref 0.0–0.1)
Basophils Relative: 0 %
Eosinophils Absolute: 0 10*3/uL (ref 0.0–0.5)
Eosinophils Relative: 1 %
HCT: 34.1 % — ABNORMAL LOW (ref 36.0–46.0)
Hemoglobin: 10.7 g/dL — ABNORMAL LOW (ref 12.0–15.0)
Immature Granulocytes: 1 %
Lymphocytes Relative: 24 %
Lymphs Abs: 1 10*3/uL (ref 0.7–4.0)
MCH: 28.7 pg (ref 26.0–34.0)
MCHC: 31.4 g/dL (ref 30.0–36.0)
MCV: 91.4 fL (ref 80.0–100.0)
Monocytes Absolute: 0.4 10*3/uL (ref 0.1–1.0)
Monocytes Relative: 10 %
Neutro Abs: 2.6 10*3/uL (ref 1.7–7.7)
Neutrophils Relative %: 64 %
Platelets: 124 10*3/uL — ABNORMAL LOW (ref 150–400)
RBC: 3.73 MIL/uL — ABNORMAL LOW (ref 3.87–5.11)
RDW: 14.6 % (ref 11.5–15.5)
WBC: 4.1 10*3/uL (ref 4.0–10.5)
nRBC: 0 % (ref 0.0–0.2)

## 2020-08-01 LAB — URINE CULTURE: Culture: 30000 — AB

## 2020-08-01 LAB — MAGNESIUM: Magnesium: 1.7 mg/dL (ref 1.7–2.4)

## 2020-08-01 LAB — GLUCOSE, CAPILLARY
Glucose-Capillary: 109 mg/dL — ABNORMAL HIGH (ref 70–99)
Glucose-Capillary: 111 mg/dL — ABNORMAL HIGH (ref 70–99)
Glucose-Capillary: 120 mg/dL — ABNORMAL HIGH (ref 70–99)
Glucose-Capillary: 128 mg/dL — ABNORMAL HIGH (ref 70–99)

## 2020-08-01 LAB — PROCALCITONIN: Procalcitonin: 0.69 ng/mL

## 2020-08-01 LAB — BRAIN NATRIURETIC PEPTIDE: B Natriuretic Peptide: 1342.3 pg/mL — ABNORMAL HIGH (ref 0.0–100.0)

## 2020-08-01 MED ORDER — FOSFOMYCIN TROMETHAMINE 3 G PO PACK
3.0000 g | PACK | Freq: Once | ORAL | Status: AC
  Administered 2020-08-01: 3 g via ORAL
  Filled 2020-08-01: qty 3

## 2020-08-01 MED ORDER — HYDRALAZINE HCL 10 MG PO TABS
10.0000 mg | ORAL_TABLET | Freq: Three times a day (TID) | ORAL | Status: DC
  Administered 2020-08-01 – 2020-08-02 (×5): 10 mg via ORAL
  Filled 2020-08-01 (×5): qty 1

## 2020-08-01 NOTE — Progress Notes (Addendum)
PROGRESS NOTE                                                                                                                                                                                                             Patient Demographics:    Kristin Adkins, is a 87 y.o. female, DOB - [birth year], [id number]  Outpatient Primary MD for the patient is Jene Every, MD    LOS - 2  Admit date - 07/30/2020    Chief Complaint  Patient presents with  . Shortness of Breath       Brief Narrative (HPI from H&P) - Kristin Adkins is a 87 y.o. 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   Subjective:   Patient in bed appears to be in no distress, mildly confused, denies any headache chest or abdominal pain, improved SOB, no focal weakness.   Assessment  & Plan :     1. Acute Hypoxic Resp. Failure due to Acute on Chr. Combined systolic and diastolic CHF EF [id number] - she is currently on IV Lasix along with potassium supplementation, cannot use ACE/ARB, Entresto or beta-blocker due to renal dysfunction and soft blood pressure.  Continue to monitor may require higher dose of Lasix upon discharge as according to the son patient has had recurrent CHF exacerbations requiring admission at [facility], recent echo in care everywhere from February 2022 shows EF of 35%.  Cardiology following.  Appreciate their assistance.  Encouraged the patient to sit up in chair in the daytime use I-S and flutter valve for pulmonary toiletry and then prone in bed when at night.  Will advance activity and titrate down oxygen as possible.   2.   Paroxysmal atrial fibrillation [place] vas 2 score of greater than 3.  On amiodarone and Eliquis.  Continue, Cards following.  3.  Pneumonia.  No acute issue.  4.  UTI.  UC now showing Ent.Fecalix - stop Rocephin, 1 dose Fosfomycin on 08/01/20.  5.  Underlying dementia, deconditioning, advanced age.  At risk for delirium, minimize narcotics and benzodiazepines, supportive care, PT-OT, may  require SNF placement  rather than ALF.  6.  Mild non-ACS pattern troponin rise due to demand ischemia from CHF, no acute issue, continue to monitor.   7.  AKI on CKD 4.  Baseline creatinine 1.5 to 1.7, cautiously monitor renal function with ongoing diuresis.  8. DM type II.  On SSI.  Lab Results  Component Value Date   HGBA1C 5.2 07/30/2020    CBG (last 3)  Recent Labs    07/31/20 2111 08/01/20 0524 08/01/20 1153  GLUCAP 135* 109* 111*         Condition -  Guarded  Family Communication  : Docia Barrier [phone] 07/31/20, 08/01/20  Code Status :  DNR  Consults  :  None  PUD Prophylaxis : PPI    Procedures  :            Disposition Plan  :    Status is: Inpatient  Remains inpatient appropriate because:IV treatments appropriate due to intensity of illness or inability to take PO  Dispo: The patient is from: SNF              Anticipated d/c is to: SNF              Anticipated d/c date is: 3 days              Patient currently is not medically stable to d/c.   Difficult to place patient No  DVT Prophylaxis  :  Eliquis  Lab Results  Component Value Date   PLT 124 (L) 08/01/2020    Diet :  Diet Order            Diet heart healthy/carb modified Room service appropriate? Yes; Fluid consistency: Thin  Diet effective now                  Inpatient Medications  Scheduled Meds: . [START ON 08/02/2020] amiodarone  100 mg Oral Daily  . amiodarone  300 mg Oral BID  . apixaban  2.5 mg Oral BID  . furosemide  40 mg Intravenous BID  . hydrALAZINE  10 mg Oral Q8H  .  insulin aspart  0-6 Units Subcutaneous TID WC  . melatonin  6 mg Oral QHS  . multivitamin with minerals  1 tablet Oral Daily  . pantoprazole  40 mg Oral Daily  . potassium chloride  20 mEq Oral BID  . sodium chloride flush  3 mL Intravenous Q12H   Continuous Infusions: . sodium chloride Stopped (08/01/20 0034)  . cefTRIAXone (ROCEPHIN)  IV Stopped (07/31/20 2144)  . doxycycline (VIBRAMYCIN) IV 100 mg (08/01/20 1153)   PRN Meds:.sodium chloride, acetaminophen, albuterol, sodium chloride flush  Antibiotics  :    Anti-infectives (From admission, onward)   Start     Dose/Rate Route Frequency Ordered Stop   07/30/20 2200  cefTRIAXone (ROCEPHIN) 1 g in sodium chloride 0.9 % 100 mL IVPB        1 g 200 mL/hr over 30 Minutes Intravenous Every 24 hours 07/30/20 0843 08/04/20 2159   07/30/20 1200  doxycycline (VIBRAMYCIN) 100 mg in sodium chloride 0.9 % 250 mL IVPB        100 mg 125 mL/hr over 120 Minutes Intravenous 2 times daily 07/30/20 1100     07/30/20 0845  cefTRIAXone (ROCEPHIN) 1 g in sodium chloride 0.9 % 100 mL IVPB  Status:  Discontinued        1 g 200 mL/hr over 30 Minutes Intravenous Every 24 hours 07/30/20 0842  07/30/20 0843   07/30/20 0545  vancomycin (VANCOREADY) IVPB 1000 mg/200 mL        1,000 mg 200 mL/hr over 60 Minutes Intravenous  Once 07/30/20 0506 07/30/20 0723   07/30/20 0515  aztreonam (AZACTAM) 2 g in sodium chloride 0.9 % 100 mL IVPB        2 g 200 mL/hr over 30 Minutes Intravenous  Once 07/30/20 0506 07/30/20 0835       Time Spent in minutes  30   Lala Lund M.D on 08/01/2020 at 12:30 PM  To page go to [email or web address]   [facility] -  Office  [phone]    See all Orders from today for further details    Objective:   Vitals:   08/01/20 0305 08/01/20 0411 08/01/20 0721 08/01/20 1102  BP: (!) 134/51  (!) 141/63 (!) 125/93  Pulse: 70     Resp: 16  20 (!) 22  Temp: 98 F (36.7 C)  97.9 F (36.6 C) 98 F (36.7 C)  TempSrc: Oral   Oral   SpO2: 96%     Weight:  71.9 kg    Height:        Wt Readings from Last 3 Encounters:  08/01/20 71.9 kg     Intake/Output Summary (Last 24 hours) at 08/01/2020 1230 Last data filed at 08/01/2020 0646 Gross per 24 hour  Intake 979.94 ml  Output 400 ml  Net 579.94 ml     Physical Exam  Awake but pleasantly confused, no focal deficits, [place].AT,PERRAL Supple Neck,No JVD, No cervical lymphadenopathy appriciated.  Symmetrical Chest wall movement, Good air movement bilaterally, mild crackles RRR,No Gallops, Rubs or new Murmurs, No Parasternal Heave +ve B.Sounds, Abd Soft, No tenderness, No organomegaly appriciated, No rebound - guarding or rigidity. No Cyanosis, trace edema     Data Review:    CBC Recent Labs  Lab 07/30/20 0420 07/30/20 0557 07/31/20 0033 08/01/20 0118  WBC 7.8  --  4.6 4.1  HGB 11.4* 12.2 10.2* 10.7*  HCT 38.3 36.0 31.2* 34.1*  PLT 119*  --  109* 124*  MCV 96.2  --  89.7 91.4  MCH 28.6  --  29.3 28.7  MCHC 29.8*  --  32.7 31.4  RDW 14.4  --  14.6 14.6  LYMPHSABS 1.3  --   --  1.0  MONOABS 0.3  --   --  0.4  EOSABS 0.0  --   --  0.0  BASOSABS 0.0  --   --  0.0    Recent Labs  Lab 07/30/20 0420 07/30/20 0557 07/30/20 0909 07/30/20 0910 07/31/20 0033 07/31/20 1136 08/01/20 0118  NA 136 136  --   --  139  --  140  K 3.7 3.7  --   --  3.7  --  4.1  CL 102 103  --   --  105  --  105  CO2 20*  --   --   --  23  --  25  GLUCOSE 285* 283*  --   --  104*  --  125*  BUN 20 29*  --   --  24*  --  23  CREATININE 1.95* 1.90*  --   --  2.02*  --  1.91*  CALCIUM 9.1  --   --   --  9.3  --  9.3  AST 41  --   --   --   --   --  26  ALT 28  --   --   --   --   --  24  ALKPHOS 74  --   --   --   --   --  68  BILITOT 0.9  --   --   --   --   --  0.9  ALBUMIN 3.1*  --   --   --   --   --  2.9*  MG  --   --  2.0  --   --   --  1.7  PROCALCITON  --   --  0.37  --   --  0.80 0.69  HGBA1C  --   --   --  5.2  --   --   --   BNP 1,149.9*  --   --   --   --    --  1,342.3*    ------------------------------------------------------------------------------------------------------------------ No results for input(s): CHOL, HDL, LDLCALC, TRIG, CHOLHDL, LDLDIRECT in the last 72 hours.  Lab Results  Component Value Date   HGBA1C 5.2 07/30/2020   ------------------------------------------------------------------------------------------------------------------ No results for input(s): TSH, T4TOTAL, T3FREE, THYROIDAB in the last 72 hours.  Invalid input(s): FREET3  Cardiac Enzymes No results for input(s): CKMB, TROPONINI, MYOGLOBIN in the last 168 hours.  Invalid input(s): CK ------------------------------------------------------------------------------------------------------------------    Component Value Date/Time   BNP 1,342.3 (H) 08/01/2020 0118    Micro Results Recent Results (from the past 240 hour(s))  Blood culture (routine x 2)     Status: None (Preliminary result)   Collection Time: 07/30/20  6:06 AM   Specimen: BLOOD LEFT HAND  Result Value Ref Range Status   Specimen Description BLOOD LEFT HAND  Final   Special Requests   Final    BOTTLES DRAWN AEROBIC ONLY Blood Culture adequate volume   Culture   Final    NO GROWTH 2 DAYS Performed at [facility], [address] [place], [place] [address]    Report Status PENDING  Incomplete  Blood culture (routine x 2)     Status: None (Preliminary result)   Collection Time: 07/30/20  6:08 AM   Specimen: BLOOD RIGHT HAND  Result Value Ref Range Status   Specimen Description BLOOD RIGHT HAND  Final   Special Requests   Final    BOTTLES DRAWN AEROBIC ONLY Blood Culture results may not be optimal due to an inadequate volume of blood received in culture bottles   Culture   Final    NO GROWTH 2 DAYS Performed at [facility], [place] [address], [place], [place] [address]    Report Status PENDING  Incomplete  Resp Panel by RT-PCR (Flu A&B, Covid) Nasopharyngeal Swab      Status: None   Collection Time: 07/30/20  6:30 AM   Specimen: Nasopharyngeal Swab; Nasopharyngeal(NP) swabs in vial transport medium  Result Value Ref Range Status   SARS Coronavirus 2 by RT PCR NEGATIVE NEGATIVE Final    Comment: (NOTE) SARS-CoV-2 target nucleic acids are NOT DETECTED.  The SARS-CoV-2 RNA is generally detectable in upper respiratory specimens during the acute phase of infection. The lowest concentration of SARS-CoV-2 viral copies this assay can detect is 138 copies/mL. A negative result does not preclude SARS-Cov-2 infection and should not be used as the sole basis for treatment or other patient management decisions. A negative result may occur with  improper specimen collection/handling, submission of specimen other than nasopharyngeal swab, presence of viral mutation(s) within the areas targeted by this assay, and inadequate number of viral copies(<138 copies/mL). A negative result must be combined with clinical observations, patient  history, and epidemiological information. The expected result is Negative.  Fact Sheet for Patients:  EntrepreneurPulse.com.au  Fact Sheet for Healthcare Providers:  IncredibleEmployment.be  This test is no t yet approved or cleared by the [place] FDA and  has been authorized for detection and/or diagnosis of SARS-CoV-2 by FDA under an Emergency Use Authorization (EUA). This EUA will remain  in effect (meaning this test can be used) for the duration of the COVID-19 declaration under Section 564(b)(1) of the Act, 21 U.S.C.section 360bbb-3(b)(1), unless the authorization is terminated  or revoked sooner.       Influenza A by PCR NEGATIVE NEGATIVE Final   Influenza B by PCR NEGATIVE NEGATIVE Final    Comment: (NOTE) The Xpert Xpress SARS-CoV-2/FLU/RSV plus assay is intended as an aid in the diagnosis of influenza from Nasopharyngeal swab specimens and should not be used as a sole basis  for treatment. Nasal washings and aspirates are unacceptable for Xpert Xpress SARS-CoV-2/FLU/RSV testing.  Fact Sheet for Patients: EntrepreneurPulse.com.au  Fact Sheet for Healthcare Providers: IncredibleEmployment.be  This test is not yet approved or cleared by the [place] FDA and has been authorized for detection and/or diagnosis of SARS-CoV-2 by FDA under an Emergency Use Authorization (EUA). This EUA will remain in effect (meaning this test can be used) for the duration of the COVID-19 declaration under Section 564(b)(1) of the Act, 21 U.S.C. section 360bbb-3(b)(1), unless the authorization is terminated or revoked.  Performed at [facility], [place] [address], [place], [place] [address]   Culture, Urine     Status: Abnormal   Collection Time: 07/30/20  8:32 AM   Specimen: Urine, Random  Result Value Ref Range Status   Specimen Description URINE, RANDOM  Final   Special Requests   Final    NONE Performed at [facility], [place] [address], [place], [place] [address]    Culture 30,000 COLONIES/mL ENTEROCOCCUS FAECALIS (A)  Final   Report Status 08/01/2020 FINAL  Final   Organism ID, Bacteria ENTEROCOCCUS FAECALIS (A)  Final      Susceptibility   Enterococcus faecalis - MIC*    AMPICILLIN <=2 SENSITIVE Sensitive     NITROFURANTOIN <=16 SENSITIVE Sensitive     VANCOMYCIN 1 SENSITIVE Sensitive     * 30,000 COLONIES/mL ENTEROCOCCUS FAECALIS    Radiology Reports DG Chest Port 1 View  Result Date: 07/31/2020 CLINICAL DATA:  Shortness of breath EXAM: PORTABLE CHEST 1 VIEW COMPARISON:  Yesterday FINDINGS: Right more than left pleural effusion and adjacent pulmonary opacity, presumably atelectasis based on January 2022 chest CT. Cardiomegaly. Negative for pneumothorax or convincing edema. IMPRESSION: Right more than left pleural effusion. Increased chest opacity from before may be from differences in positioning and layering.  Electronically Signed   By: Monte Fantasia M.D.   On: 07/31/2020 08:11   DG Chest Portable 1 View  Result Date: 07/30/2020 CLINICAL DATA:  Shortness of breath.  Respiratory distress EXAM: PORTABLE CHEST 1 VIEW COMPARISON:  07/15/2020 FINDINGS: Chronic cardiomegaly. Hazy opacity at the bases primarily attributed to pleural effusion. There is superimposed pulmonary opacity that could be atelectasis or pneumonia. Upper lung markings are similar to before. No pneumothorax. IMPRESSION: Cardiomegaly, vascular congestion, and bilateral pleural effusion. Atelectasis or infiltrate may be superimposed at the lower lobes. Electronically Signed   By: Monte Fantasia M.D.   On: 07/30/2020 04:52

## 2020-08-01 NOTE — NC FL2 (Signed)
[place] LEVEL OF CARE SCREENING TOOL     IDENTIFICATION  Patient Name: Kristin Adkins Birthdate: 23-Sep-1920 Sex: female Admission Date (Current Location): 07/30/2020  [facility] and [place] Number:      Facility and Address:  [facility] [place], [place] [address] [place], [place] [address]      Provider Number: [id number]  Attending Physician Name and Address:  Thurnell Lose, MD  Relative Name and Phone Number:  JOYFUL, SERRA)   [phone] [facility])    Current Level of Care: Hospital Recommended Level of Care:   Prior Approval Number:    Date Approved/Denied:   PASRR Number: [id number]  Discharge Plan: SNF    Current Diagnoses: Patient Active Problem List   Diagnosis Date Noted  . Acute on chronic combined systolic and diastolic CHF (congestive heart failure) ([place]) 07/30/2020  . DNR (do not resuscitate) 07/30/2020  . Acute on chronic respiratory failure with hypoxia ([place]) 07/30/2020  . AF (paroxysmal atrial fibrillation) ([place]) 07/30/2020  . Chronic anticoagulation 07/30/2020  . History of COVID-19 07/30/2020  . Transient hypotension 07/30/2020  . Type 2 diabetes mellitus without complication ([place]) [id number]  . Prolonged QT interval 07/30/2020  . Acute lower UTI 07/30/2020  . Respiratory failure (H[facility])     Orientation RESPIRATION BLADDER Height & Weight     Self,Time,Place  O2 (Crisp 2L) Continent Weight: 158 lb 8.2 oz (71.9 kg) Height:  '5\' 2"'$  (157.5 cm)  BEHAVIORAL SYMPTOMS/MOOD NEUROLOGICAL BOWEL NUTRITION STATUS      Continent Diet (see d/c summary)  AMBULATORY STATUS COMMUNICATION OF NEEDS Skin     Verbally Other (Comment) (Right arm skin tear, posterior, distal)                       Personal Care Assistance Level of Assistance  Bathing,Feeding,Dressing Bathing Assistance: Limited assistance Feeding assistance: Independent Dressing Assistance: Limited assistance     Functional Limitations Info   Sight,Hearing,Speech Sight Info: Impaired Hearing Info: Impaired Speech Info: Adequate    SPECIAL CARE FACTORS FREQUENCY  OT (By licensed OT),PT (By licensed PT)     PT Frequency: 5x/week OT Frequency: 5x/week            Contractures Contractures Info: Not present    Additional Factors Info  Code Status,Allergies Code Status Info: DNR Allergies Info: Nitrofurantoin, codeine, penecillins           Current Medications (08/01/2020):  This is the current hospital active medication list Current Facility-Administered Medications  Medication Dose Route Frequency Provider Last Rate Last Admin  . 0.9 %  sodium chloride infusion  250 mL Intravenous PRN Norval Morton, MD   Stopped at 08/01/20 0034  . acetaminophen (TYLENOL) tablet 650 mg  650 mg Oral Q4H PRN Fuller Plan A, MD   650 mg at 08/01/20 0048  . albuterol (PROVENTIL) (2.5 MG/3ML) 0.083% nebulizer solution 2.5 mg  2.5 mg Nebulization Q6H PRN Norval Morton, MD      . Derrill Memo ON 08/02/2020] amiodarone (PACERONE) tablet 100 mg  100 mg Oral Daily Smith, Rondell A, MD      . amiodarone (PACERONE) tablet 300 mg  300 mg Oral BID Fuller Plan A, MD   300 mg at 08/01/20 0825  . apixaban (ELIQUIS) tablet 2.5 mg  2.5 mg Oral BID Fuller Plan A, MD   2.5 mg at 08/01/20 0826  . cefTRIAXone (ROCEPHIN) 1 g in sodium chloride 0.9 % 100 mL IVPB  1 g Intravenous Q24H  Norval Morton, MD   Stopped at 07/31/20 2144  . doxycycline (VIBRAMYCIN) 100 mg in sodium chloride 0.9 % 250 mL IVPB  100 mg Intravenous BID Fuller Plan A, MD   Stopped at 08/01/20 0027  . furosemide (LASIX) injection 40 mg  40 mg Intravenous BID Dorothy Spark, MD   40 mg at 08/01/20 0825  . hydrALAZINE (APRESOLINE) tablet 10 mg  10 mg Oral Q8H Nahser, Wonda Cheng, MD      . insulin aspart (novoLOG) injection 0-6 Units  0-6 Units Subcutaneous TID WC Smith, Rondell A, MD      . melatonin tablet 6 mg  6 mg Oral QHS Smith, Rondell A, MD   6 mg at 07/31/20 2107  .  multivitamin with minerals tablet 1 tablet  1 tablet Oral Daily Fuller Plan A, MD   1 tablet at 08/01/20 0825  . pantoprazole (PROTONIX) EC tablet 40 mg  40 mg Oral Daily Thurnell Lose, MD   40 mg at 08/01/20 0826  . potassium chloride SA (KLOR-CON) CR tablet 20 mEq  20 mEq Oral BID Nahser, Wonda Cheng, MD   20 mEq at 08/01/20 [id number]  . sodium chloride flush (NS) 0.9 % injection 3 mL  3 mL Intravenous Q12H Smith, Rondell A, MD   3 mL at 07/31/20 2112  . sodium chloride flush (NS) 0.9 % injection 3 mL  3 mL Intravenous PRN Norval Morton, MD         Discharge Medications: Please see discharge summary for a list of discharge medications.  Relevant Imaging Results:  Relevant Lab Results:   Additional Information SSN [id number]  Bethann Berkshire, LCSW

## 2020-08-01 NOTE — Progress Notes (Signed)
Pharmacist Heart Failure Core Measure Documentation  Assessment: Kristin Adkins has an EF documented as 30-35% on 07/18/20 by Echo.  Rationale: Heart failure patients with left ventricular systolic dysfunction (LVSD) and an EF < 40% should be prescribed an angiotensin converting enzyme inhibitor (ACEI) or angiotensin receptor blocker (ARB) at discharge unless a contraindication is documented in the medical record.  This patient is not currently on an ACEI or ARB for HF.  This note is being placed in the record in order to provide documentation that a contraindication to the use of these agents is present for this encounter.  ACE Inhibitor or Angiotensin Receptor Blocker is contraindicated (specify all that apply)  '[]'$   ACEI allergy AND ARB allergy '[]'$   Angioedema '[]'$   Moderate or severe aortic stenosis '[]'$   Hyperkalemia '[]'$   Hypotension '[]'$   Renal artery stenosis '[x]'$   Worsening renal function, preexisting renal disease or dysfunction  Hildred Laser, PharmD Clinical Pharmacist **Pharmacist phone directory can now be found on [email or web address] ([email or web address]).  Listed under [place].

## 2020-08-01 NOTE — Progress Notes (Signed)
Progress Note  Patient Name: Kristin Adkins Date of Encounter: 08/01/2020  [facility] Cardiologist: new , [place]   Subjective   87 yo with acute on chronic combined CHF, recent covid pneumonia, CKD admitted with respiratory failure , hypoxemia   Has been diuresed. Creatinine is stable at 1.91 She is net + 193 cc for the hospitalization      Inpatient Medications    Scheduled Meds: . [START ON 08/02/2020] amiodarone  100 mg Oral Daily  . amiodarone  300 mg Oral BID  . apixaban  2.5 mg Oral BID  . furosemide  40 mg Intravenous BID  . insulin aspart  0-6 Units Subcutaneous TID WC  . melatonin  6 mg Oral QHS  . multivitamin with minerals  1 tablet Oral Daily  . pantoprazole  40 mg Oral Daily  . potassium chloride  20 mEq Oral BID  . sodium chloride flush  3 mL Intravenous Q12H   Continuous Infusions: . sodium chloride Stopped (08/01/20 0034)  . cefTRIAXone (ROCEPHIN)  IV Stopped (07/31/20 2144)  . doxycycline (VIBRAMYCIN) IV Stopped (08/01/20 0027)   PRN Meds: sodium chloride, acetaminophen, albuterol, sodium chloride flush   Vital Signs    Vitals:   07/31/20 2300 08/01/20 0305 08/01/20 0411 08/01/20 0721  BP: (!) 132/47 (!) 134/51  (!) 141/63  Pulse: 80 70    Resp: '18 16  20  '$ Temp: 97.9 F (36.6 C) 98 F (36.7 C)    TempSrc: Oral Oral    SpO2: 94% 96%    Weight:   71.9 kg   Height:        Intake/Output Summary (Last 24 hours) at 08/01/2020 0813 Last data filed at 08/01/2020 0646 Gross per 24 hour  Intake 1459.94 ml  Output 1200 ml  Net 259.94 ml   Last 3 Weights 08/01/2020 07/30/2020 07/30/2020  Weight (lbs) 158 lb 8.2 oz 169 lb 12.1 oz 156 lb 15.5 oz  Weight (kg) 71.9 kg 77 kg 71.2 kg      Telemetry    nsr   - Personally Reviewed  ECG       Physical Exam   Physical Exam: Blood pressure (!) 141/63, pulse 70, temperature 98 F (36.7 C), temperature source Oral, resp. rate 20, height '5\' 2"'$  (1.575 m), weight 71.9 kg, SpO2 96 %.  GEN:   Elderly female  HEENT: Normal NECK: No JVD; No carotid bruits LYMPHATICS: No lymphadenopathy CARDIAC: RRR  , soft systolic murmur  RESPIRATORY:  Clear to auscultation without rales, wheezing or rhonchi  ABDOMEN: Soft, non-tender, non-distended MUSCULOSKELETAL:  No edema; No deformity  SKIN: Warm and dry NEUROLOGIC:  Alert and oriented x 3   Labs    High Sensitivity Troponin:   Recent Labs  Lab 07/30/20 0420 07/30/20 0615  TROPONINIHS 29* 50*      Chemistry Recent Labs  Lab 07/30/20 0420 07/30/20 0557 07/31/20 0033 08/01/20 0118  NA 136 136 139 140  K 3.7 3.7 3.7 4.1  CL 102 103 105 105  CO2 20*  --  23 25  GLUCOSE 285* 283* 104* 125*  BUN 20 29* 24* 23  CREATININE 1.95* 1.90* 2.02* 1.91*  CALCIUM 9.1  --  9.3 9.3  PROT 6.2*  --   --  5.9*  ALBUMIN 3.1*  --   --  2.9*  AST 41  --   --  26  ALT 28  --   --  24  ALKPHOS 74  --   --  68  BILITOT 0.9  --   --  0.9  GFRNONAA 23*  --  22* 24*  ANIONGAP 14  --  11 10     Hematology Recent Labs  Lab 07/30/20 0420 07/30/20 0557 07/31/20 0033 08/01/20 0118  WBC 7.8  --  4.6 4.1  RBC 3.98  --  3.48* 3.73*  HGB 11.4* 12.2 10.2* 10.7*  HCT 38.3 36.0 31.2* 34.1*  MCV 96.2  --  89.7 91.4  MCH 28.6  --  29.3 28.7  MCHC 29.8*  --  32.7 31.4  RDW 14.4  --  14.6 14.6  PLT 119*  --  109* 124*    BNP Recent Labs  Lab 07/30/20 0420 08/01/20 0118  BNP 1,149.9* 1,342.3*     DDimer No results for input(s): DDIMER in the last 168 hours.   Radiology    DG Chest Port 1 View  Result Date: 07/31/2020 CLINICAL DATA:  Shortness of breath EXAM: PORTABLE CHEST 1 VIEW COMPARISON:  Yesterday FINDINGS: Right more than left pleural effusion and adjacent pulmonary opacity, presumably atelectasis based on January 2022 chest CT. Cardiomegaly. Negative for pneumothorax or convincing edema. IMPRESSION: Right more than left pleural effusion. Increased chest opacity from before may be from differences in positioning and layering.  Electronically Signed   By: Monte Fantasia M.D.   On: 07/31/2020 08:11    Cardiac Studies      Patient Profile     87 y.o. female   [place]    1.   Acute on chronic combined systolic and diastolic congestive heart failure: Patient presents with hypoxemia and respiratory failure.  He was markedly volume overloaded.  Echocardiogram at [facility] reveals moderately depressed left ventricular systolic and diastolic function with EF 30-35%. She was also found to have moderate pulmonary hypertension with an estimated PA pressure of 50.  She is feeling well.   Her I/O reflect a net + fluid balance but her lungs sound fine and she has no leg edema .  I suspect the I/O are not accurate im not sure she will tolerate ARB or entresto.   Will start low dose hydralazine 10 TID and follow   2.  Acute on chronic renal insufficiency:  Further plans per [facility] team    3.  Atrial fib:  Cont eliquis 2.5 BID Cont amiodarone  Is in sinus rhythm      For questions or updates, please contact [place] Please consult [email or web address] for contact info under        Signed, Mertie Moores, MD  08/01/2020, 8:13 AM

## 2020-08-02 DIAGNOSIS — I5043 Acute on chronic combined systolic (congestive) and diastolic (congestive) heart failure: Secondary | ICD-10-CM | POA: Diagnosis not present

## 2020-08-02 LAB — COMPREHENSIVE METABOLIC PANEL
ALT: 21 U/L (ref 0–44)
AST: 23 U/L (ref 15–41)
Albumin: 2.7 g/dL — ABNORMAL LOW (ref 3.5–5.0)
Alkaline Phosphatase: 64 U/L (ref 38–126)
Anion gap: 10 (ref 5–15)
BUN: 20 mg/dL (ref 8–23)
CO2: 24 mmol/L (ref 22–32)
Calcium: 9 mg/dL (ref 8.9–10.3)
Chloride: 105 mmol/L (ref 98–111)
Creatinine, Ser: 1.76 mg/dL — ABNORMAL HIGH (ref 0.44–1.00)
GFR, Estimated: 26 mL/min — ABNORMAL LOW (ref >60)
Glucose, Bld: 106 mg/dL — ABNORMAL HIGH (ref 70–99)
Potassium: 4.3 mmol/L (ref 3.5–5.1)
Sodium: 139 mmol/L (ref 135–145)
Total Bilirubin: 0.8 mg/dL (ref 0.3–1.2)
Total Protein: 5.6 g/dL — ABNORMAL LOW (ref 6.5–8.1)

## 2020-08-02 LAB — GLUCOSE, CAPILLARY
Glucose-Capillary: 115 mg/dL — ABNORMAL HIGH (ref 70–99)
Glucose-Capillary: 118 mg/dL — ABNORMAL HIGH (ref 70–99)
Glucose-Capillary: 109 mg/dL — ABNORMAL HIGH (ref 70–99)

## 2020-08-02 LAB — CBC WITH DIFFERENTIAL/PLATELET
Abs Immature Granulocytes: 0.04 10*3/uL (ref 0.00–0.07)
Basophils Absolute: 0 10*3/uL (ref 0.0–0.1)
Basophils Relative: 1 %
Eosinophils Absolute: 0 10*3/uL (ref 0.0–0.5)
Eosinophils Relative: 1 %
HCT: 32.3 % — ABNORMAL LOW (ref 36.0–46.0)
Hemoglobin: 10.3 g/dL — ABNORMAL LOW (ref 12.0–15.0)
Immature Granulocytes: 1 %
Lymphocytes Relative: 28 %
Lymphs Abs: 1.1 10*3/uL (ref 0.7–4.0)
MCH: 29 pg (ref 26.0–34.0)
MCHC: 31.9 g/dL (ref 30.0–36.0)
MCV: 91 fL (ref 80.0–100.0)
Monocytes Absolute: 0.4 10*3/uL (ref 0.1–1.0)
Monocytes Relative: 10 %
Neutro Abs: 2.3 10*3/uL (ref 1.7–7.7)
Neutrophils Relative %: 59 %
Platelets: 120 10*3/uL — ABNORMAL LOW (ref 150–400)
RBC: 3.55 MIL/uL — ABNORMAL LOW (ref 3.87–5.11)
RDW: 14.6 % (ref 11.5–15.5)
WBC: 3.8 10*3/uL — ABNORMAL LOW (ref 4.0–10.5)
nRBC: 0 % (ref 0.0–0.2)

## 2020-08-02 LAB — MAGNESIUM: Magnesium: 1.7 mg/dL (ref 1.7–2.4)

## 2020-08-02 LAB — SARS CORONAVIRUS 2 (TAT 6-24 HRS): SARS Coronavirus 2: NEGATIVE

## 2020-08-02 LAB — PROCALCITONIN: Procalcitonin: 0.41 ng/mL

## 2020-08-02 LAB — BRAIN NATRIURETIC PEPTIDE: B Natriuretic Peptide: 1239.4 pg/mL — ABNORMAL HIGH (ref 0.0–100.0)

## 2020-08-02 MED ORDER — TORSEMIDE 20 MG PO TABS
20.0000 mg | ORAL_TABLET | Freq: Every day | ORAL | Status: DC
  Administered 2020-08-02: 20 mg via ORAL
  Filled 2020-08-02: qty 1

## 2020-08-02 MED ORDER — TORSEMIDE 20 MG PO TABS: 20.0000 mg | ORAL_TABLET | Freq: Every day | ORAL | Status: DC

## 2020-08-02 MED ORDER — DOXYCYCLINE HYCLATE 100 MG PO TABS: 100.0000 mg | ORAL_TABLET | Freq: Two times a day (BID) | ORAL | Status: DC

## 2020-08-02 MED ORDER — DOXYCYCLINE HYCLATE 100 MG PO TABS
100.0000 mg | ORAL_TABLET | Freq: Two times a day (BID) | ORAL | Status: DC
  Administered 2020-08-02: 100 mg via ORAL
  Filled 2020-08-02: qty 1

## 2020-08-02 MED ORDER — POTASSIUM CHLORIDE CRYS ER 20 MEQ PO TBCR: 20.0000 meq | EXTENDED_RELEASE_TABLET | Freq: Every day | ORAL | Status: AC

## 2020-08-02 MED ORDER — ALBUTEROL SULFATE (2.5 MG/3ML) 0.083% IN NEBU: 2.5000 mg | INHALATION_SOLUTION | Freq: Four times a day (QID) | RESPIRATORY_TRACT | 12 refills | Status: AC | PRN

## 2020-08-02 MED ORDER — FAMOTIDINE 10 MG PO TABS: 10.0000 mg | ORAL_TABLET | Freq: Every day | ORAL | 1 refills | Status: AC

## 2020-08-02 MED ORDER — APIXABAN 2.5 MG PO TABS: 2.5000 mg | ORAL_TABLET | Freq: Two times a day (BID) | ORAL | Status: DC

## 2020-08-02 MED ORDER — POTASSIUM CHLORIDE CRYS ER 20 MEQ PO TBCR
20.0000 meq | EXTENDED_RELEASE_TABLET | Freq: Every day | ORAL | Status: DC
  Administered 2020-08-02: 20 meq via ORAL
  Filled 2020-08-02: qty 1

## 2020-08-02 MED ORDER — HYDRALAZINE HCL 10 MG PO TABS: 10.0000 mg | ORAL_TABLET | Freq: Three times a day (TID) | ORAL | Status: DC

## 2020-08-02 NOTE — Progress Notes (Signed)
Patient discharged to [facility] with [facility]AR.  Belongings including upper and lower dentures, glasses, clothing, shoes, and stuffed rabbit sent with patient.  Son, [name], notified.

## 2020-08-02 NOTE — Progress Notes (Signed)
Pts order for BIPAP is PRN at this time. Pt is resting comfortably w/respiratory status stable on RA. RT will continue to monitor.

## 2020-08-02 NOTE — Discharge Summary (Signed)
Physician Discharge Summary  Kristin Adkins [id number] DOB: February 13, 1921 DOA: 07/30/2020  PCP: Jene Every, MD  Admit date: 07/30/2020 Discharge date: 08/02/2020  Admitted From: SNF Discharge disposition: SNF   Recommendations for Outpatient Follow-Up:   1. Doxy through 2/25 and then d/c 2. BMP 1 week 3. Close cardiology follow up   Discharge Diagnosis:   Principal Problem:   Acute on chronic combined systolic and diastolic CHF (congestive heart failure) ([place]) Active Problems:   DNR (do not resuscitate)   Acute on chronic respiratory failure with hypoxia (HCC)   AF (paroxysmal atrial fibrillation) (HCC)   Chronic anticoagulation   History of COVID-19   Transient hypotension   Type 2 diabetes mellitus without complication (HCC)   Prolonged QT interval   Acute lower UTI    Discharge Condition: Improved.  Diet recommendation: Low sodium, heart healthy.  Carbohydrate-modified  Wound care: None.  Code status: DNR   History of Present Illness:   Kristin Adkins is a 87 y.o. 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  Patient only notes that she has shortness of breath and feels like she may be wheezing, but denies any other complaints at this time. Patient son provides additional history as he reports that the patient has some dementia and is hard of hearing.  Patient had been hospitalized 06/01/2020 for flash pulmonary edema, 1/16 for pneumonia due to COVID-19, and 2/5 for respiratory failure with hypoxia secondary to to CHF and atrial fibrillation.  During her last hospitalization she was found to have an acute decrease in EF from > 55% in 05/2020 down  to 30-35%, but cardiac catheterization was not pursued due to the possibility of it worsening her kidney function.  She had been diagnosed with a urinary tract infection while at rehab and started on ciprofloxacin 2 days ago.  He notes that staff noted that the patient was a little confused the other day.  En route with EMS patient was given 2 sublingual nitroglycerin, albuterol, and placed on CPAP.   Hospital Course by Problem:     Acute Hypoxic Resp. Failure due to Acute on Chr. Combined systolic and diastolic CHF EF [id number]  -cardiology consult -changed to demadex -daily weights  Paroxysmal atrial fibrillation [place] vas 2 score of greater than 3.  On amiodarone and Eliquis.  Continue, Cards outpatient    Pneumonia.  finish abx course   UTI.  UC now showing Ent.Fecalix - stop Rocephin, 1 dose Fosfomycin on 08/01/20.  Underlying dementia, deconditioning, advanced age.  At risk for delirium, minimize narcotics and benzodiazepines, supportive care  Mild non-ACS pattern troponin rise due to demand ischemia from CHF, no acute issue, continue to monitor.   AKI on CKD 4.  Baseline creatinine 1.5 to 1.7, cautiously monitor renal function with ongoing diuresis.  DM type II.  On SSI.    Medical Consultants:   cards   Discharge Exam:   Vitals:   08/02/20 0312 08/02/20 0925  BP: (!) 121/44 (!) 118/43  Pulse:  68  Resp: 15 15  Temp: 98 F (36.7 C) 97.8 F (36.6 C)  SpO2: 100%    Vitals:   08/01/20 2320 08/02/20 0312 08/02/20 0603 08/02/20 0925  BP: (!) 136/54 (!) 121/44  Marland Kitchen)  118/43  Pulse: 82   68  Resp: '18 15  15  '$ Temp: 97.8 F (36.6 C) 98 F (36.7 C)  97.8 F (36.6 C)  TempSrc: Oral Oral  Oral  SpO2: 95% 100%    Weight:   71.7 kg   Height:        General exam: Appears calm and comfortable.   The results of significant diagnostics from this hospitalization (including imaging, microbiology, ancillary and laboratory) are listed below for reference.      Procedures and Diagnostic Studies:   DG Chest Port 1 View  Result Date: 07/31/2020 CLINICAL DATA:  Shortness of breath EXAM: PORTABLE CHEST 1 VIEW COMPARISON:  Yesterday FINDINGS: Right more than left pleural effusion and adjacent pulmonary opacity, presumably atelectasis based on January 2022 chest CT. Cardiomegaly. Negative for pneumothorax or convincing edema. IMPRESSION: Right more than left pleural effusion. Increased chest opacity from before may be from differences in positioning and layering. Electronically Signed   By: Monte Fantasia M.D.   On: 07/31/2020 08:11   DG Chest Portable 1 View  Result Date: 07/30/2020 CLINICAL DATA:  Shortness of breath.  Respiratory distress EXAM: PORTABLE CHEST 1 VIEW COMPARISON:  07/15/2020 FINDINGS: Chronic cardiomegaly. Hazy opacity at the bases primarily attributed to pleural effusion. There is superimposed pulmonary opacity that could be atelectasis or pneumonia. Upper lung markings are similar to before. No pneumothorax. IMPRESSION: Cardiomegaly, vascular congestion, and bilateral pleural effusion. Atelectasis or infiltrate may be superimposed at the lower lobes. Electronically Signed   By: Monte Fantasia M.D.   On: 07/30/2020 04:52     Labs:   Basic Metabolic Panel: Recent Labs  Lab 07/30/20 0420 07/30/20 0557 07/30/20 0909 07/31/20 0033 08/01/20 0118 08/02/20 0156  NA 136 136  --  139 140 139  K 3.7 3.7  --  3.7 4.1 4.3  CL 102 103  --  105 105 105  CO2 20*  --   --  '23 25 24  '$ GLUCOSE 285* 283*  --  104* 125* 106*  BUN 20 29*  --  24* 23 20  CREATININE 1.95* 1.90*  --  2.02* 1.91* 1.76*  CALCIUM 9.1  --   --  9.3 9.3 9.0  MG  --   --  2.0  --  1.7 1.7   GFR Estimated Creatinine Clearance: 16.9 mL/min (A) (by C-G formula based on SCr of 1.76 mg/dL (H)). Liver Function Tests: Recent Labs  Lab 07/30/20 0420 08/01/20 0118 08/02/20 0156  AST 41 26 23  ALT '28 24 21  '$ ALKPHOS 74 68 64  BILITOT 0.9 0.9 0.8  PROT 6.2* 5.9* 5.6*   ALBUMIN 3.1* 2.9* 2.7*   No results for input(s): LIPASE, AMYLASE in the last 168 hours. No results for input(s): AMMONIA in the last 168 hours. Coagulation profile No results for input(s): INR, PROTIME in the last 168 hours.  CBC: Recent Labs  Lab 07/30/20 0420 07/30/20 0557 07/31/20 0033 08/01/20 0118 08/02/20 0156  WBC 7.8  --  4.6 4.1 3.8*  NEUTROABS 6.1  --   --  2.6 2.3  HGB 11.4* 12.2 10.2* 10.7* 10.3*  HCT 38.3 36.0 31.2* 34.1* 32.3*  MCV 96.2  --  89.7 91.4 91.0  PLT 119*  --  109* 124* 120*   Cardiac Enzymes: No results for input(s): CKTOTAL, CKMB, CKMBINDEX, TROPONINI in the last 168 hours. BNP: Invalid input(s): POCBNP CBG: Recent Labs  Lab 08/01/20 1153 08/01/20 1613 08/01/20 2115 08/02/20 0600 08/02/20 1048  GLUCAP 111*  120* 128* 115* 118*   D-Dimer No results for input(s): DDIMER in the last 72 hours. Hgb A1c No results for input(s): HGBA1C in the last 72 hours. Lipid Profile No results for input(s): CHOL, HDL, LDLCALC, TRIG, CHOLHDL, LDLDIRECT in the last 72 hours. Thyroid function studies No results for input(s): TSH, T4TOTAL, T3FREE, THYROIDAB in the last 72 hours.  Invalid input(s): FREET3 Anemia work up No results for input(s): VITAMINB12, FOLATE, FERRITIN, TIBC, IRON, RETICCTPCT in the last 72 hours. Microbiology Recent Results (from the past 240 hour(s))  Blood culture (routine x 2)     Status: None (Preliminary result)   Collection Time: 07/30/20  6:06 AM   Specimen: BLOOD LEFT HAND  Result Value Ref Range Status   Specimen Description BLOOD LEFT HAND  Final   Special Requests   Final    BOTTLES DRAWN AEROBIC ONLY Blood Culture adequate volume   Culture   Final    NO GROWTH 3 DAYS Performed at [facility], [address], [place], [place] [address]    Report Status PENDING  Incomplete  Blood culture (routine x 2)     Status: None (Preliminary result)   Collection Time: 07/30/20  6:08 AM   Specimen: BLOOD RIGHT HAND   Result Value Ref Range Status   Specimen Description BLOOD RIGHT HAND  Final   Special Requests   Final    BOTTLES DRAWN AEROBIC ONLY Blood Culture results may not be optimal due to an inadequate volume of blood received in culture bottles   Culture   Final    NO GROWTH 3 DAYS Performed at [facility], [place] [address] [place], [place] [address]    Report Status PENDING  Incomplete  Resp Panel by RT-PCR (Flu A&B, Covid) Nasopharyngeal Swab     Status: None   Collection Time: 07/30/20  6:30 AM   Specimen: Nasopharyngeal Swab; Nasopharyngeal(NP) swabs in vial transport medium  Result Value Ref Range Status   SARS Coronavirus 2 by RT PCR NEGATIVE NEGATIVE Final    Comment: (NOTE) SARS-CoV-2 target nucleic acids are NOT DETECTED.  The SARS-CoV-2 RNA is generally detectable in upper respiratory specimens during the acute phase of infection. The lowest concentration of SARS-CoV-2 viral copies this assay can detect is 138 copies/mL. A negative result does not preclude SARS-Cov-2 infection and should not be used as the sole basis for treatment or other patient management decisions. A negative result may occur with  improper specimen collection/handling, submission of specimen other than nasopharyngeal swab, presence of viral mutation(s) within the areas targeted by this assay, and inadequate number of viral copies(<138 copies/mL). A negative result must be combined with clinical observations, patient history, and epidemiological information. The expected result is Negative.  Fact Sheet for Patients:  EntrepreneurPulse.com.au  Fact Sheet for Healthcare Providers:  IncredibleEmployment.be  This test is no t yet approved or cleared by the [place] FDA and  has been authorized for detection and/or diagnosis of SARS-CoV-2 by FDA under an Emergency Use Authorization (EUA). This EUA will remain  in effect (meaning this test can be used) for the  duration of the COVID-19 declaration under Section 564(b)(1) of the Act, 21 U.S.C.section 360bbb-3(b)(1), unless the authorization is terminated  or revoked sooner.       Influenza A by PCR NEGATIVE NEGATIVE Final   Influenza B by PCR NEGATIVE NEGATIVE Final    Comment: (NOTE) The Xpert Xpress SARS-CoV-2/FLU/RSV plus assay is intended as an aid in the diagnosis of  influenza from Nasopharyngeal swab specimens and should not be used as a sole basis for treatment. Nasal washings and aspirates are unacceptable for Xpert Xpress SARS-CoV-2/FLU/RSV testing.  Fact Sheet for Patients: EntrepreneurPulse.com.au  Fact Sheet for Healthcare Providers: IncredibleEmployment.be  This test is not yet approved or cleared by the [place] FDA and has been authorized for detection and/or diagnosis of SARS-CoV-2 by FDA under an Emergency Use Authorization (EUA). This EUA will remain in effect (meaning this test can be used) for the duration of the COVID-19 declaration under Section 564(b)(1) of the Act, 21 U.S.C. section 360bbb-3(b)(1), unless the authorization is terminated or revoked.  Performed at [facility], [place] [address], [place], [place] [address]   Culture, Urine     Status: Abnormal   Collection Time: 07/30/20  8:32 AM   Specimen: Urine, Random  Result Value Ref Range Status   Specimen Description URINE, RANDOM  Final   Special Requests   Final    NONE Performed at [facility], [place] [address] [place], [place] [address]    Culture 30,000 COLONIES/mL ENTEROCOCCUS FAECALIS (A)  Final   Report Status 08/01/2020 FINAL  Final   Organism ID, Bacteria ENTEROCOCCUS FAECALIS (A)  Final      Susceptibility   Enterococcus faecalis - MIC*    AMPICILLIN <=2 SENSITIVE Sensitive     NITROFURANTOIN <=16 SENSITIVE Sensitive     VANCOMYCIN 1 SENSITIVE Sensitive     * 30,000 COLONIES/mL ENTEROCOCCUS FAECALIS     Discharge Instructions:    Discharge Instructions    Diet - low sodium heart healthy   Complete by: As directed    Diet Carb Modified   Complete by: As directed    Increase activity slowly   Complete by: As directed    No wound care   Complete by: As directed      Allergies as of 08/02/2020      Reactions   Codeine    Other reaction(s): UNKNOWN   Nitrofurantoin    Other reaction(s): UNKNOWN   Penicillins Rash      Medication List    STOP taking these medications   alum & mag hydroxide-simeth 200-200-20 MG/5ML suspension Commonly known as: MAALOX/MYLANTA   ciprofloxacin 500 MG tablet Commonly known as: CIPRO   furosemide 40 MG tablet Commonly known as: LASIX   loperamide 2 MG capsule Commonly known as: IMODIUM   metoprolol tartrate 25 MG tablet Commonly known as: LOPRESSOR   nitroGLYCERIN 0.4 MG SL tablet Commonly known as: NITROSTAT   OVER THE COUNTER MEDICATION   OXYGEN   spironolactone 25 MG tablet Commonly known as: ALDACTONE     TAKE these medications   acetaminophen 500 MG tablet Commonly known as: TYLENOL Take 500 mg by mouth every 6 (six) hours as needed for fever or mild pain (discomfort).   albuterol (2.5 MG/3ML) 0.083% nebulizer solution Commonly known as: PROVENTIL Take 3 mLs (2.5 mg total) by nebulization every 6 (six) hours as needed for shortness of breath or wheezing.   amiodarone 100 MG tablet Commonly known as: PACERONE Take 100 mg by mouth daily. What changed: Another medication with the same name was removed. Continue taking this medication, and follow the directions you see here.   apixaban 2.5 MG Tabs tablet Commonly known as: ELIQUIS Take 1 tablet (2.5 mg total) by mouth 2 (two) times daily. What changed:   medication strength  how much to take  when to take this  additional instructions  Dermacloud Oint Apply 1 application topically See admin instructions. To buttocks every shift   doxycycline 100 MG tablet Commonly known as:  VIBRA-TABS Take 1 tablet (100 mg total) by mouth 2 (two) times daily.   famotidine 10 MG tablet Commonly known as: PEPCID Take 1 tablet (10 mg total) by mouth daily.   hydrALAZINE 10 MG tablet Commonly known as: APRESOLINE Take 1 tablet (10 mg total) by mouth every 8 (eight) hours.   magnesium oxide 400 MG tablet Commonly known as: MAG-OX Take 400 mg by mouth daily.   melatonin 3 MG Tabs tablet Take 6 mg by mouth at bedtime.   multivitamin with minerals tablet Take 1 tablet by mouth daily.   potassium chloride SA 20 MEQ tablet Commonly known as: KLOR-CON Take 1 tablet (20 mEq total) by mouth daily.   Skin Prep Wipes Misc Apply 1 application topically See admin instructions. Apply to bilateral heels bid   torsemide 20 MG tablet Commonly known as: DEMADEX Take 1 tablet (20 mg total) by mouth daily.       Follow-up Information    Tommie Raymond, NP Follow up on 08/24/2020.   Specialty: Cardiology Why: '@3'$ :15am for hospital follow up with Dr. Francesca Oman PA/NP Contact information: [place] [address] [phone]                Time coordinating discharge: 35 min  Signed:  Geradine Girt DO  [facility] 08/02/2020, 12:47 PM

## 2020-08-02 NOTE — Progress Notes (Addendum)
Progress Note  Patient Name: June Feldberg Date of Encounter: 08/02/2020  [facility] Cardiologist: nelson   Subjective   87 yo with acute on chronic combined CHF, recent covid pneumonia, CKD admitted with respiratory failure , hypoxemia   Has been diuresed. Net diuresis of 1.9 liters so far this hospitalization Creatinine is 1.76 Feels ok  Will dc Iv lasix,  Start torsemide 20 d ady  Reduce kdur to daily     Inpatient Medications    Scheduled Meds: . amiodarone  100 mg Oral Daily  . apixaban  2.5 mg Oral BID  . furosemide  40 mg Intravenous BID  . hydrALAZINE  10 mg Oral Q8H  . insulin aspart  0-6 Units Subcutaneous TID WC  . melatonin  6 mg Oral QHS  . multivitamin with minerals  1 tablet Oral Daily  . pantoprazole  40 mg Oral Daily  . potassium chloride  20 mEq Oral BID  . sodium chloride flush  3 mL Intravenous Q12H   Continuous Infusions: . sodium chloride Stopped (08/02/20 0115)  . doxycycline (VIBRAMYCIN) IV Stopped (08/01/20 2301)   PRN Meds: sodium chloride, acetaminophen, albuterol, sodium chloride flush   Vital Signs    Vitals:   08/01/20 1955 08/01/20 2320 08/02/20 0312 08/02/20 0603  BP: (!) 139/52 (!) 136/54 (!) 121/44   Pulse: 77 82 (!) 212   Resp: '16 18 15   '$ Temp: 97.6 F (36.4 C) 97.8 F (36.6 C) 98 F (36.7 C)   TempSrc: Oral Oral Oral   SpO2: 94% 95% 100%   Weight:    71.7 kg  Height:        Intake/Output Summary (Last 24 hours) at 08/02/2020 0846 Last data filed at 08/02/2020 0604 Gross per 24 hour  Intake 319.9 ml  Output 1700 ml  Net -1380.1 ml   Last 3 Weights 08/02/2020 08/01/2020 07/30/2020  Weight (lbs) 158 lb 1.1 oz 158 lb 8.2 oz 169 lb 12.1 oz  Weight (kg) 71.7 kg 71.9 kg 77 kg      Telemetry    NSR - Personally Reviewed  ECG       Physical Exam   Physical Exam: Blood pressure (!) 121/44, pulse (!) 212, temperature 98 F (36.7 C), temperature source Oral, resp. rate 15, height '5\' 2"'$  (1.575 m), weight 71.7  kg, SpO2 100 %.  GEN:  Elderly female,  NAD  HEENT: Normal NECK: No JVD; No carotid bruits LYMPHATICS: No lymphadenopathy CARDIAC: RRR , no murmurs, rubs, gallops RESPIRATORY:  ? Reduced breath sounds in r base ABDOMEN: Soft, non-tender, non-distended MUSCULOSKELETAL:  No edema; No deformity  SKIN: Warm and dry NEUROLOGIC:  Alert and oriented x 3   Labs    High Sensitivity Troponin:   Recent Labs  Lab 07/30/20 0420 07/30/20 0615  TROPONINIHS 29* 50*      Chemistry Recent Labs  Lab 07/30/20 0420 07/30/20 0557 07/31/20 0033 08/01/20 0118 08/02/20 0156  NA 136   < > 139 140 139  K 3.7   < > 3.7 4.1 4.3  CL 102   < > 105 105 105  CO2 20*  --  '23 25 24  '$ GLUCOSE 285*   < > 104* 125* 106*  BUN 20   < > 24* 23 20  CREATININE 1.95*   < > 2.02* 1.91* 1.76*  CALCIUM 9.1  --  9.3 9.3 9.0  PROT 6.2*  --   --  5.9* 5.6*  ALBUMIN 3.1*  --   --  2.9* 2.7*  AST 41  --   --  26 23  ALT 28  --   --  24 21  ALKPHOS 74  --   --  68 64  BILITOT 0.9  --   --  0.9 0.8  GFRNONAA 23*  --  22* 24* 26*  ANIONGAP 14  --  '11 10 10   '$ < > = values in this interval not displayed.     Hematology Recent Labs  Lab 07/31/20 0033 08/01/20 0118 08/02/20 0156  WBC 4.6 4.1 3.8*  RBC 3.48* 3.73* 3.55*  HGB 10.2* 10.7* 10.3*  HCT 31.2* 34.1* 32.3*  MCV 89.7 91.4 91.0  MCH 29.3 28.7 29.0  MCHC 32.7 31.4 31.9  RDW 14.6 14.6 14.6  PLT 109* 124* 120*    BNP Recent Labs  Lab 07/30/20 0420 08/01/20 0118 08/02/20 0157  BNP 1,149.9* 1,342.3* 1,239.4*     DDimer No results for input(s): DDIMER in the last 168 hours.   Radiology    No results found.  Cardiac Studies      Patient Profile     87 y.o. female   [place]    1.   Acute on chronic combined systolic and diastolic congestive heart failure: Patient presents with hypoxemia and respiratory failure.  He was markedly volume overloaded.  Echocardiogram at [facility] reveals moderately depressed left ventricular systolic  and diastolic function with EF 30-35%. She was also found to have moderate pulmonary hypertension with an estimated PA pressure of 50.   She is feeling back to fairly normal. I think we can ambulate her and potentially look toward DC soon ? PT / OT consults might be useful   2.  Acute on chronic renal insufficiency:  Further plans per triad team    3.  Atrial fib:  Remains in NSR ,  Cont eliquis, amiodarone       For questions or updates, please contact [place] Please consult [email or web address] for contact info under        Signed, Mertie Moores, MD  08/02/2020, 8:46 AM

## 2020-08-02 NOTE — TOC Transition Note (Signed)
Transition of Care [facility]) - CM/SW Discharge Note   Patient Details  Name: Kristin Adkins MRN: [id number] Date of Birth: [birth year]  Transition of Care [facility]) CM/SW Contact:  Bethann Berkshire, [place] Phone Number: 08/02/2020, 3:14 PM   Clinical Narrative:     Pt needs to be at facility by 9pm otherwise pt will need to be transported tomorrow.   Patient will DC to: [facility] Anticipated DC date: 08/02/20 Family notified: ANGELIE, STIFFLER (Son)  [phone] [facility]) Transport by: Corey Harold   Per MD patient ready for DC to [facility]. RN, patient, patient's family, and facility notified of DC. Discharge Summary and FL2 sent to facility. RN to call report prior to discharge [phone]). DC packet on chart. Ambulance transport requested for patient.   CSW will sign off for now as social work intervention is no longer needed. Please consult [place] again if new needs arise.    Final next level of care: Skilled Nursing Facility Barriers to Discharge: No Barriers Identified   Patient Goals and CMS Choice Patient states their goals for this hospitalization and ongoing recovery are:: Son wants return to [facility]      Discharge Placement              Patient chooses bed at: [facility] [place] Patient to be transferred to facility by: [place] Name of family member notified: AMIELIA, RAMSON (Son)   [phone] [facility]) Patient and family notified of of transfer: 08/02/20  Discharge Plan and Services                                     Social Determinants of Health (SDOH) Interventions     Readmission Risk Interventions No flowsheet data found.

## 2020-08-04 LAB — CULTURE, BLOOD (ROUTINE X 2)
Culture: NO GROWTH
Culture: NO GROWTH
Special Requests: ADEQUATE

## 2020-08-11 ENCOUNTER — Encounter (HOSPITAL_COMMUNITY): Payer: Self-pay | Admitting: Emergency Medicine

## 2020-08-11 ENCOUNTER — Other Ambulatory Visit: Payer: Self-pay

## 2020-08-11 ENCOUNTER — Emergency Department (HOSPITAL_COMMUNITY): Payer: Medicare Other

## 2020-08-11 ENCOUNTER — Inpatient Hospital Stay (HOSPITAL_COMMUNITY)
Admission: EM | Admit: 2020-08-11 | Discharge: 2020-08-15 | DRG: 280 | Disposition: A | Discharge status: Skilled Nursing Facility | Payer: Medicare Other | Source: Skilled Nursing Facility | Attending: Family Medicine | Admitting: Family Medicine

## 2020-08-11 DIAGNOSIS — I2510 Atherosclerotic heart disease of native coronary artery without angina pectoris: Secondary | ICD-10-CM | POA: Diagnosis present

## 2020-08-11 DIAGNOSIS — Z66 Do not resuscitate: Secondary | ICD-10-CM | POA: Diagnosis present

## 2020-08-11 DIAGNOSIS — I214 Non-ST elevation (NSTEMI) myocardial infarction: Secondary | ICD-10-CM

## 2020-08-11 DIAGNOSIS — R6510 Systemic inflammatory response syndrome (SIRS) of non-infectious origin without acute organ dysfunction: Secondary | ICD-10-CM | POA: Diagnosis present

## 2020-08-11 DIAGNOSIS — N189 Chronic kidney disease, unspecified: Secondary | ICD-10-CM

## 2020-08-11 DIAGNOSIS — J9621 Acute and chronic respiratory failure with hypoxia: Secondary | ICD-10-CM | POA: Diagnosis present

## 2020-08-11 DIAGNOSIS — L89611 Pressure ulcer of right heel, stage 1: Secondary | ICD-10-CM | POA: Diagnosis present

## 2020-08-11 DIAGNOSIS — F0390 Unspecified dementia without behavioral disturbance: Secondary | ICD-10-CM | POA: Diagnosis present

## 2020-08-11 DIAGNOSIS — Z20822 Contact with and (suspected) exposure to covid-19: Secondary | ICD-10-CM | POA: Diagnosis present

## 2020-08-11 DIAGNOSIS — J9601 Acute respiratory failure with hypoxia: Secondary | ICD-10-CM | POA: Diagnosis present

## 2020-08-11 DIAGNOSIS — R000 Tachycardia, unspecified: Secondary | ICD-10-CM | POA: Diagnosis present

## 2020-08-11 DIAGNOSIS — N184 Chronic kidney disease, stage 4 (severe): Secondary | ICD-10-CM | POA: Diagnosis present

## 2020-08-11 DIAGNOSIS — E1122 Type 2 diabetes mellitus with diabetic chronic kidney disease: Secondary | ICD-10-CM | POA: Diagnosis present

## 2020-08-11 DIAGNOSIS — Z880 Allergy status to penicillin: Secondary | ICD-10-CM

## 2020-08-11 DIAGNOSIS — Z79899 Other long term (current) drug therapy: Secondary | ICD-10-CM

## 2020-08-11 DIAGNOSIS — I5043 Acute on chronic combined systolic (congestive) and diastolic (congestive) heart failure: Secondary | ICD-10-CM

## 2020-08-11 DIAGNOSIS — I480 Paroxysmal atrial fibrillation: Secondary | ICD-10-CM

## 2020-08-11 DIAGNOSIS — Z8616 Personal history of COVID-19: Secondary | ICD-10-CM

## 2020-08-11 DIAGNOSIS — J810 Acute pulmonary edema: Secondary | ICD-10-CM | POA: Diagnosis present

## 2020-08-11 DIAGNOSIS — Z7189 Other specified counseling: Secondary | ICD-10-CM | POA: Diagnosis not present

## 2020-08-11 DIAGNOSIS — Z885 Allergy status to narcotic agent status: Secondary | ICD-10-CM | POA: Diagnosis not present

## 2020-08-11 DIAGNOSIS — I130 Hypertensive heart and chronic kidney disease with heart failure and stage 1 through stage 4 chronic kidney disease, or unspecified chronic kidney disease: Secondary | ICD-10-CM | POA: Diagnosis present

## 2020-08-11 DIAGNOSIS — Z79890 Hormone replacement therapy (postmenopausal): Secondary | ICD-10-CM | POA: Diagnosis not present

## 2020-08-11 DIAGNOSIS — I440 Atrioventricular block, first degree: Secondary | ICD-10-CM | POA: Diagnosis present

## 2020-08-11 DIAGNOSIS — N179 Acute kidney failure, unspecified: Secondary | ICD-10-CM | POA: Diagnosis present

## 2020-08-11 DIAGNOSIS — Z515 Encounter for palliative care: Secondary | ICD-10-CM

## 2020-08-11 DIAGNOSIS — L8990 Pressure ulcer of unspecified site, unspecified stage: Secondary | ICD-10-CM | POA: Insufficient documentation

## 2020-08-11 DIAGNOSIS — Z7901 Long term (current) use of anticoagulants: Secondary | ICD-10-CM

## 2020-08-11 DIAGNOSIS — I252 Old myocardial infarction: Secondary | ICD-10-CM | POA: Diagnosis not present

## 2020-08-11 DIAGNOSIS — J90 Pleural effusion, not elsewhere classified: Secondary | ICD-10-CM

## 2020-08-11 DIAGNOSIS — Z888 Allergy status to other drugs, medicaments and biological substances status: Secondary | ICD-10-CM

## 2020-08-11 DIAGNOSIS — E1165 Type 2 diabetes mellitus with hyperglycemia: Secondary | ICD-10-CM | POA: Diagnosis present

## 2020-08-11 DIAGNOSIS — Z905 Acquired absence of kidney: Secondary | ICD-10-CM

## 2020-08-11 DIAGNOSIS — J9622 Acute and chronic respiratory failure with hypercapnia: Secondary | ICD-10-CM | POA: Diagnosis present

## 2020-08-11 DIAGNOSIS — R609 Edema, unspecified: Secondary | ICD-10-CM | POA: Diagnosis not present

## 2020-08-11 DIAGNOSIS — I272 Pulmonary hypertension, unspecified: Secondary | ICD-10-CM | POA: Diagnosis present

## 2020-08-11 DIAGNOSIS — I255 Ischemic cardiomyopathy: Secondary | ICD-10-CM | POA: Diagnosis present

## 2020-08-11 DIAGNOSIS — I447 Left bundle-branch block, unspecified: Secondary | ICD-10-CM | POA: Diagnosis present

## 2020-08-11 LAB — RESP PANEL BY RT-PCR (FLU A&B, COVID) ARPGX2
Influenza A by PCR: NEGATIVE
Influenza B by PCR: NEGATIVE
SARS Coronavirus 2 by RT PCR: NEGATIVE

## 2020-08-11 LAB — COMPREHENSIVE METABOLIC PANEL
ALT: 26 U/L (ref 0–44)
AST: 43 U/L — ABNORMAL HIGH (ref 15–41)
Albumin: 3.2 g/dL — ABNORMAL LOW (ref 3.5–5.0)
Alkaline Phosphatase: 83 U/L (ref 38–126)
Anion gap: 15 (ref 5–15)
BUN: 24 mg/dL — ABNORMAL HIGH (ref 8–23)
CO2: 19 mmol/L — ABNORMAL LOW (ref 22–32)
Calcium: 10.2 mg/dL (ref 8.9–10.3)
Chloride: 102 mmol/L (ref 98–111)
Creatinine, Ser: 2.3 mg/dL — ABNORMAL HIGH (ref 0.44–1.00)
GFR, Estimated: 19 mL/min — ABNORMAL LOW (ref >60)
Glucose, Bld: 285 mg/dL — ABNORMAL HIGH (ref 70–99)
Potassium: 4.8 mmol/L (ref 3.5–5.1)
Sodium: 136 mmol/L (ref 135–145)
Total Bilirubin: 1 mg/dL (ref 0.3–1.2)
Total Protein: 6.5 g/dL (ref 6.5–8.1)

## 2020-08-11 LAB — CBC WITH DIFFERENTIAL/PLATELET
Abs Immature Granulocytes: 0.26 10*3/uL — ABNORMAL HIGH (ref 0.00–0.07)
Basophils Absolute: 0.1 10*3/uL (ref 0.0–0.1)
Basophils Relative: 0 %
Eosinophils Absolute: 0 10*3/uL (ref 0.0–0.5)
Eosinophils Relative: 0 %
HCT: 44.1 % (ref 36.0–46.0)
Hemoglobin: 13.2 g/dL (ref 12.0–15.0)
Immature Granulocytes: 2 %
Lymphocytes Relative: 56 %
Lymphs Abs: 7.4 10*3/uL — ABNORMAL HIGH (ref 0.7–4.0)
MCH: 28.6 pg (ref 26.0–34.0)
MCHC: 29.9 g/dL — ABNORMAL LOW (ref 30.0–36.0)
MCV: 95.5 fL (ref 80.0–100.0)
Monocytes Absolute: 0.7 10*3/uL (ref 0.1–1.0)
Monocytes Relative: 5 %
Neutro Abs: 4.9 10*3/uL (ref 1.7–7.7)
Neutrophils Relative %: 37 %
Platelets: 160 10*3/uL (ref 150–400)
RBC: 4.62 MIL/uL (ref 3.87–5.11)
RDW: 14.7 % (ref 11.5–15.5)
WBC: 13.3 10*3/uL — ABNORMAL HIGH (ref 4.0–10.5)
nRBC: 0.2 % (ref 0.0–0.2)

## 2020-08-11 LAB — I-STAT ARTERIAL BLOOD GAS, ED
Acid-base deficit: 5 mmol/L — ABNORMAL HIGH (ref 0.0–2.0)
Bicarbonate: 24.3 mmol/L (ref 20.0–28.0)
Calcium, Ion: 1.4 mmol/L (ref 1.15–1.40)
HCT: 43 % (ref 36.0–46.0)
Hemoglobin: 14.6 g/dL (ref 12.0–15.0)
O2 Saturation: 95 %
Patient temperature: 97.6
Potassium: 5.1 mmol/L (ref 3.5–5.1)
Sodium: 136 mmol/L (ref 135–145)
TCO2: 26 mmol/L (ref 22–32)
pCO2 arterial: 61.2 mmHg — ABNORMAL HIGH (ref 32.0–48.0)
pH, Arterial: 7.204 — ABNORMAL LOW (ref 7.350–7.450)
pO2, Arterial: 88 mmHg (ref 83.0–108.0)

## 2020-08-11 LAB — I-STAT CHEM 8, ED
BUN: 41 mg/dL — ABNORMAL HIGH (ref 8–23)
Calcium, Ion: 1.23 mmol/L (ref 1.15–1.40)
Chloride: 104 mmol/L (ref 98–111)
Creatinine, Ser: 2.1 mg/dL — ABNORMAL HIGH (ref 0.44–1.00)
Glucose, Bld: 304 mg/dL — ABNORMAL HIGH (ref 70–99)
HCT: 42 % (ref 36.0–46.0)
Hemoglobin: 14.3 g/dL (ref 12.0–15.0)
Potassium: 6.3 mmol/L (ref 3.5–5.1)
Sodium: 134 mmol/L — ABNORMAL LOW (ref 135–145)
TCO2: 24 mmol/L (ref 22–32)

## 2020-08-11 LAB — BRAIN NATRIURETIC PEPTIDE: B Natriuretic Peptide: 1300.9 pg/mL — ABNORMAL HIGH (ref 0.0–100.0)

## 2020-08-11 LAB — CBG MONITORING, ED
Glucose-Capillary: 140 mg/dL — ABNORMAL HIGH (ref 70–99)
Glucose-Capillary: 107 mg/dL — ABNORMAL HIGH (ref 70–99)

## 2020-08-11 LAB — TROPONIN I (HIGH SENSITIVITY)
Troponin I (High Sensitivity): 24 ng/L — ABNORMAL HIGH (ref <18)
Troponin I (High Sensitivity): 196 ng/L (ref <18)

## 2020-08-11 LAB — GLUCOSE, CAPILLARY
Glucose-Capillary: 99 mg/dL (ref 70–99)
Glucose-Capillary: 112 mg/dL — ABNORMAL HIGH (ref 70–99)

## 2020-08-11 LAB — LACTIC ACID, PLASMA: Lactic Acid, Venous: 2.3 mmol/L (ref 0.5–1.9)

## 2020-08-11 LAB — PATHOLOGIST SMEAR REVIEW

## 2020-08-11 LAB — MAGNESIUM: Magnesium: 2.4 mg/dL (ref 1.7–2.4)

## 2020-08-11 MED ORDER — FUROSEMIDE 10 MG/ML IJ SOLN
40.0000 mg | Freq: Once | INTRAMUSCULAR | Status: AC
  Administered 2020-08-11: 40 mg via INTRAVENOUS
  Filled 2020-08-11: qty 4

## 2020-08-11 MED ORDER — MELATONIN 3 MG PO TABS
6.0000 mg | ORAL_TABLET | Freq: Every day | ORAL | Status: DC
  Administered 2020-08-11 – 2020-08-14 (×3): 6 mg via ORAL
  Filled 2020-08-11 (×5): qty 2

## 2020-08-11 MED ORDER — SKIN PREP WIPES MISC: 1.0000 "application " | Status: DC

## 2020-08-11 MED ORDER — FUROSEMIDE 10 MG/ML IJ SOLN: 40.0000 mg | Freq: Every day | INTRAMUSCULAR | Status: DC

## 2020-08-11 MED ORDER — APIXABAN 2.5 MG PO TABS
2.5000 mg | ORAL_TABLET | Freq: Two times a day (BID) | ORAL | Status: DC
  Administered 2020-08-11 – 2020-08-15 (×8): 2.5 mg via ORAL
  Filled 2020-08-11 (×10): qty 1

## 2020-08-11 MED ORDER — SODIUM CHLORIDE 0.9% FLUSH: 3.0000 mL | INTRAVENOUS | Status: DC | PRN

## 2020-08-11 MED ORDER — CHLORHEXIDINE GLUCONATE CLOTH 2 % EX PADS
6.0000 | MEDICATED_PAD | Freq: Every day | CUTANEOUS | Status: DC
  Administered 2020-08-11 – 2020-08-15 (×5): 6 via TOPICAL

## 2020-08-11 MED ORDER — SODIUM CHLORIDE 0.9% FLUSH
3.0000 mL | Freq: Two times a day (BID) | INTRAVENOUS | Status: DC
  Administered 2020-08-11 – 2020-08-15 (×9): 3 mL via INTRAVENOUS

## 2020-08-11 MED ORDER — SODIUM CHLORIDE 0.9 % IV SOLN: 250.0000 mL | INTRAVENOUS | Status: DC | PRN

## 2020-08-11 MED ORDER — ACETAMINOPHEN 325 MG PO TABS
650.0000 mg | ORAL_TABLET | ORAL | Status: DC | PRN
  Administered 2020-08-12 – 2020-08-15 (×2): 650 mg via ORAL
  Filled 2020-08-11 (×2): qty 2

## 2020-08-11 MED ORDER — LORAZEPAM 2 MG/ML IJ SOLN
0.2500 mg | Freq: Four times a day (QID) | INTRAMUSCULAR | Status: DC | PRN
  Administered 2020-08-11 – 2020-08-13 (×5): 0.5 mg via INTRAVENOUS
  Filled 2020-08-11 (×5): qty 1

## 2020-08-11 MED ORDER — ZINC OXIDE 20 % EX OINT
1.0000 "application " | TOPICAL_OINTMENT | Freq: Two times a day (BID) | CUTANEOUS | Status: DC
  Administered 2020-08-11 – 2020-08-15 (×8): 1 via TOPICAL
  Filled 2020-08-11 (×3): qty 28.35

## 2020-08-11 MED ORDER — FUROSEMIDE 10 MG/ML IJ SOLN
40.0000 mg | Freq: Two times a day (BID) | INTRAMUSCULAR | Status: DC
  Administered 2020-08-12 – 2020-08-14 (×5): 40 mg via INTRAVENOUS
  Filled 2020-08-11 (×5): qty 4

## 2020-08-11 MED ORDER — FUROSEMIDE 10 MG/ML IJ SOLN: 40.0000 mg | Freq: Two times a day (BID) | INTRAMUSCULAR | Status: DC

## 2020-08-11 MED ORDER — MAGNESIUM OXIDE 400 (241.3 MG) MG PO TABS
400.0000 mg | ORAL_TABLET | Freq: Every day | ORAL | Status: DC
  Administered 2020-08-12 – 2020-08-15 (×4): 400 mg via ORAL
  Filled 2020-08-11 (×5): qty 1

## 2020-08-11 MED ORDER — AMIODARONE HCL 200 MG PO TABS
100.0000 mg | ORAL_TABLET | Freq: Every day | ORAL | Status: DC
  Administered 2020-08-12 – 2020-08-15 (×4): 100 mg via ORAL
  Filled 2020-08-11 (×5): qty 1

## 2020-08-11 MED ORDER — INSULIN ASPART 100 UNIT/ML ~~LOC~~ SOLN
0.0000 [IU] | SUBCUTANEOUS | Status: DC
  Administered 2020-08-12: 1 [IU] via SUBCUTANEOUS

## 2020-08-11 NOTE — H&P (Addendum)
History and Physical    Kristin Adkins [id number] DOB: 03/09/1921 DOA: 08/11/2020  Referring MD/NP/PA: April Palumbo, MD PCP: Jene Every, MD  Patient coming from: [place] home via EMS  Chief Complaint: Shortness of breath  I have personally briefly reviewed patient's old medical records in [place]   HPI: Kristin Adkins is a 87 y.o. female with medical history significant of hypertension, combined systolic and diastolic CHF(last EF 99991111 with grade 2 diastolic dysfunction), atrial fibrillation on anticoagulation, chronic kidney disease stage IV with solitary kidney, and COVID-19 pneumonia 06/25/2020 who presents with shortness of breath.  History is limited from the patient at this time and son reports that she has some issues with her memory.  Her son had seen her yesterday and noted that she seemed to be doing well and states her weight has been staying around 156 lbs.  However, yesterday evening she was noted to be more short of breath.  Patient denies any fever, chest pain, or cough.  Patient had just been recently hospitalized from 2/20-2/23 with respiratory failure with hypoxia secondary to combined congestive heart failure exacerbation with suspected pneumonia and UTI.  She was treated with antibiotics for underlying possible infections.  She had also been switched from furosemide 40 mg daily to torsemide 20 mg daily by cardiology.  At discharge she was back on room air.  Patient has had several admissions into the hospital recently over the last few months.  Found by EMS with O2 saturations in the 70s and was placed on CPAP.  ED Course: Upon admission to the emergency department patient was seen to be afebrile, pulse 85-1 21, respiration 25-38, blood pressure 98/41-144/63, and O2 saturations currently maintained around 97% on BiPAP.  Labs significant for WBC 13.3, BUN 24, creatinine 2.3, glucose 285, albumin 3.2, AST 43, BNP 1300.9, and troponin 24.  pH 7.204,  pCO2Chest x-ray consistent for increasing CHF with small bilateral pleural effusions.  Patient has been given furosemide 40 mg IV.  TRH called to admit.  Review of Systems  Unable to perform ROS: Medical condition  Constitutional: Negative for fever.  Respiratory: Positive for shortness of breath.   Cardiovascular: Negative for chest pain.    Past Medical History:  Diagnosis Date  . AF (atrial fibrillation) ([place])   . CHF (congestive heart failure) ([place])   . CKD (chronic kidney disease), stage IV ([place])   . Coronary artery disease   . Hypertension   . Myocardial infarction ([place])   . Respiratory failure [facility])     Past Surgical History:  Procedure Laterality Date  . 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 She has never used smokeless tobacco. She reports that she does not drink alcohol. No history on file for drug use.  Allergies  Allergen Reactions  . Codeine     Other reaction(s): UNKNOWN  . Nitrofurantoin     Other reaction(s): UNKNOWN  . Penicillins Rash    History reviewed. No pertinent family history.  Prior to Admission medications   Medication Sig Start Date End Date Taking? Authorizing Provider  acetaminophen (TYLENOL) 500 MG tablet Take 500 mg by mouth every 6 (six) hours as needed for fever or mild pain (discomfort).   Yes [provider]  albuterol (PROVENTIL) (2.5 MG/3ML) 0.083% nebulizer solution Take 3 mLs (2.5 mg total) by nebulization every 6 (six) hours as needed for shortness of  breath or wheezing. 08/02/20  Yes Geradine Girt, DO  amiodarone (PACERONE) 100 MG tablet Take 100 mg by mouth daily. 07/19/20 08/01/21 Yes [provider]  apixaban (ELIQUIS) 2.5 MG TABS tablet Take 1 tablet (2.5 mg total) by mouth 2 (two) times daily. 08/02/20  Yes Eulogio Bear U, DO  famotidine (PEPCID) 10 MG tablet Take 1 tablet (10 mg total) by mouth daily. 08/02/20 08/02/21  Yes Vann, Jessica U, DO  hydrALAZINE (APRESOLINE) 10 MG tablet Take 1 tablet (10 mg total) by mouth every 8 (eight) hours. 08/02/20  Yes Vann, [place] [facility]) OINT Apply 1 application topically See admin instructions. To buttocks every shift   Yes [provider]  magnesium oxide (MAG-OX) 400 MG tablet Take 400 mg by mouth daily. 07/20/20 07/20/21 Yes [provider]  melatonin 3 MG TABS tablet Take 6 mg by mouth at bedtime. 07/19/20  Yes [provider]  Multiple Vitamins-Minerals (MULTIVITAMIN WITH MINERALS) tablet Take 1 tablet by mouth daily.   Yes [provider]  Ostomy Supplies (SKIN PREP WIPES) MISC Apply 1 application topically See admin instructions. Apply to bilateral heels every shift   Yes [provider]  potassium chloride SA (KLOR-CON) 20 MEQ tablet Take 1 tablet (20 mEq total) by mouth daily. 08/02/20  Yes Eulogio Bear U, DO  torsemide (DEMADEX) 20 MG tablet Take 1 tablet (20 mg total) by mouth daily. 08/02/20  Yes Geradine Girt, DO  doxycycline (VIBRA-TABS) 100 MG tablet Take 1 tablet (100 mg total) by mouth 2 (two) times daily. Patient not taking: Reported on 08/11/2020 08/02/20   Geradine Girt, DO    Physical Exam:  Constitutional: Elderly female who appears to be resting, but is easily awakened. Vitals:   08/11/20 0600 08/11/20 0615 08/11/20 0630 08/11/20 0700  BP: (!) 137/104 (!) 98/41 123/63 118/74  Pulse: (!) 114 (!) 115 (!) 116 (!) 114  Resp: (!) 31 (!) 26 (!) 34 (!) 36  Temp:      TempSrc:      SpO2: 98% 97% 97% 97%   Eyes: PERRL, bilateral conjunctival injection appreciated. ENMT: Mucous membranes are dry . Posterior pharynx clear of any exudate or lesions.  Neck: normal, supple, no masses, no thyromegaly Respiratory: Currently on BiPAP with unlabored respirations with Rales present.  Able to say 2 or 3 words at a time, but hard to understand due to the BiPAP machine. Cardiovascular:  Regular rate and rhythm, no murmurs / rubs / gallops.  Trace lower extremity edema. 2+ pedal pulses. No carotid bruits.  Abdomen: no tenderness, no masses palpated. No hepatosplenomegaly. Bowel sounds positive.  Musculoskeletal: no clubbing / cyanosis. No joint deformity upper and lower extremities. Good ROM, no contractures. Normal muscle tone.  Skin: no rashes, lesions, ulcers. No induration Neurologic: CN 2-12 grossly intact. Sensation intact, DTR normal. Strength 5/5 in all 4.  Psychiatric: Normal judgment and insight. Alert and oriented x 3. Normal mood.     Labs on Admission: I have personally reviewed following labs and imaging studies  CBC: Recent Labs  Lab 08/11/20 0536 08/11/20 0617  WBC 13.3*  --   NEUTROABS 4.9  --   HGB 13.2  14.3 14.6  HCT 44.1  42.0 43.0  MCV 95.5  --   PLT 160  --    Basic Metabolic Panel: Recent Labs  Lab 08/11/20 0536 08/11/20 0617  NA 136  134* 136  K 4.8  6.3* 5.1  CL 102  104  --   CO2 19*  --   GLUCOSE 285*  304*  --   BUN 24*  41*  --   CREATININE 2.30*  2.10*  --   CALCIUM 10.2  --    GFR: Estimated Creatinine Clearance: 13 mL/min (A) (by C-G formula based on SCr of 2.3 mg/dL (H)). Liver Function Tests: Recent Labs  Lab 08/11/20 0536  AST 43*  ALT 26  ALKPHOS 83  BILITOT 1.0  PROT 6.5  ALBUMIN 3.2*   No results for input(s): LIPASE, AMYLASE in the last 168 hours. No results for input(s): AMMONIA in the last 168 hours. Coagulation Profile: No results for input(s): INR, PROTIME in the last 168 hours. Cardiac Enzymes: No results for input(s): CKTOTAL, CKMB, CKMBINDEX, TROPONINI in the last 168 hours. BNP (last 3 results) No results for input(s): PROBNP in the last 8760 hours. HbA1C: No results for input(s): HGBA1C in the last 72 hours. CBG: No results for input(s): GLUCAP in the last 168 hours. Lipid Profile: No results for input(s): CHOL, HDL, LDLCALC, TRIG, CHOLHDL, LDLDIRECT in the last 72 hours. Thyroid  Function Tests: No results for input(s): TSH, T4TOTAL, FREET4, T3FREE, THYROIDAB in the last 72 hours. Anemia Panel: No results for input(s): VITAMINB12, FOLATE, FERRITIN, TIBC, IRON, RETICCTPCT in the last 72 hours. Urine analysis:    Component Value Date/Time   COLORURINE YELLOW 07/30/2020 0906   APPEARANCEUR CLEAR 07/30/2020 0906   LABSPEC 1.006 07/30/2020 0906   PHURINE 5.0 07/30/2020 0906   GLUCOSEU NEGATIVE 07/30/2020 0906   HGBUR NEGATIVE 07/30/2020 0906   BILIRUBINUR NEGATIVE 07/30/2020 0906   KETONESUR NEGATIVE 07/30/2020 0906   PROTEINUR NEGATIVE 07/30/2020 0906   NITRITE NEGATIVE 07/30/2020 0906   LEUKOCYTESUR NEGATIVE 07/30/2020 0906   Sepsis Labs: Recent Results (from the past 240 hour(s))  SARS CORONAVIRUS 2 (TAT 6-24 HRS) Nasopharyngeal Nasopharyngeal Swab     Status: None   Collection Time: 08/02/20  9:45 AM   Specimen: Nasopharyngeal Swab  Result Value Ref Range Status   SARS Coronavirus 2 NEGATIVE NEGATIVE Final    Comment: (NOTE) SARS-CoV-2 target nucleic acids are NOT DETECTED.  The SARS-CoV-2 RNA is generally detectable in upper and lower respiratory specimens during the acute phase of infection. Negative results do not preclude SARS-CoV-2 infection, do not rule out co-infections with other pathogens, and should not be used as the sole basis for treatment or other patient management decisions. Negative results must be combined with clinical observations, patient history, and epidemiological information. The expected result is Negative.  Fact Sheet for Patients: SugarRoll.be  Fact Sheet for Healthcare Providers: https://www.woods-mathews.com/  This test is not yet approved or cleared by the [place] FDA and  has been authorized for detection and/or diagnosis of SARS-CoV-2 by FDA under an Emergency Use Authorization (EUA). This EUA will remain  in effect (meaning this test can be used) for the duration of  the COVID-19 declaration under Se ction 564(b)(1) of the Act, 21 U.S.C. section 360bbb-3(b)(1), unless the authorization is terminated or revoked sooner.  Performed at [facility], [place] [address] [place], [place] [address]   Resp Panel by RT-PCR (Flu A&B, Covid) Nasopharyngeal Swab     Status: None   Collection Time: 08/11/20  4:42 AM   Specimen: Nasopharyngeal Swab; Nasopharyngeal(NP) swabs in vial transport medium  Result Value Ref Range Status   SARS Coronavirus 2 by RT PCR NEGATIVE NEGATIVE Final    Comment: (NOTE) SARS-CoV-2 target nucleic acids are NOT DETECTED.  The  SARS-CoV-2 RNA is generally detectable in upper respiratory specimens during the acute phase of infection. The lowest concentration of SARS-CoV-2 viral copies this assay can detect is 138 copies/mL. A negative result does not preclude SARS-Cov-2 infection and should not be used as the sole basis for treatment or other patient management decisions. A negative result may occur with  improper specimen collection/handling, submission of specimen other than nasopharyngeal swab, presence of viral mutation(s) within the areas targeted by this assay, and inadequate number of viral copies(<138 copies/mL). A negative result must be combined with clinical observations, patient history, and epidemiological information. The expected result is Negative.  Fact Sheet for Patients:  EntrepreneurPulse.com.au  Fact Sheet for Healthcare Providers:  IncredibleEmployment.be  This test is no t yet approved or cleared by the [place] FDA and  has been authorized for detection and/or diagnosis of SARS-CoV-2 by FDA under an Emergency Use Authorization (EUA). This EUA will remain  in effect (meaning this test can be used) for the duration of the COVID-19 declaration under Section 564(b)(1) of the Act, 21 U.S.C.section 360bbb-3(b)(1), unless the authorization is terminated  or revoked  sooner.       Influenza A by PCR NEGATIVE NEGATIVE Final   Influenza B by PCR NEGATIVE NEGATIVE Final    Comment: (NOTE) The Xpert Xpress SARS-CoV-2/FLU/RSV plus assay is intended as an aid in the diagnosis of influenza from Nasopharyngeal swab specimens and should not be used as a sole basis for treatment. Nasal washings and aspirates are unacceptable for Xpert Xpress SARS-CoV-2/FLU/RSV testing.  Fact Sheet for Patients: EntrepreneurPulse.com.au  Fact Sheet for Healthcare Providers: IncredibleEmployment.be  This test is not yet approved or cleared by the [place] FDA and has been authorized for detection and/or diagnosis of SARS-CoV-2 by FDA under an Emergency Use Authorization (EUA). This EUA will remain in effect (meaning this test can be used) for the duration of the COVID-19 declaration under Section 564(b)(1) of the Act, 21 U.S.C. section 360bbb-3(b)(1), unless the authorization is terminated or revoked.  Performed at [facility], [place] [address] [place], [place] [address]      Radiological Exams on Admission: DG Chest Portable 1 View  Result Date: 08/11/2020 CLINICAL DATA:  Shortness of breath EXAM: PORTABLE CHEST 1 VIEW COMPARISON:  07/31/2020 FINDINGS: Cardiac shadow is prominent but stable. Aortic calcifications are again identified. Bilateral pleural effusions are again identified relatively stable from the prior exam. Increasing vascular congestion and parenchymal edema is seen as well. No bony abnormality is noted. IMPRESSION: Changes consistent with increasing CHF and small bilateral pleural effusions. Electronically Signed   By: Inez Catalina M.D.   On: 08/11/2020 05:11    EKG: Independently reviewed.  Sinus tachycardia 124 bpm with QTC 550  Assessment/Plan Acute on chronic respiratory failure with hypoxia and hypercapnia secondary to combined systolic and diastolic CHF exacerbation: Patient presents with respiratory  distress with O2 saturations noted to be in the 70s.  She was placed on BiPAP.  ABG was significant for pH 7.204, PCO2 61.2, and PO2 88 concerning for a respiratory acidosis.  Chest x-ray significant for increasing CHF with small bilateral pleural effusions.  BNP 1300.9 which is increased from previous admission.  During discharge she had been started on torosemide 20 mg daily by cardiology.  Last echocardiogram on 07/18/2020 noted EF of 30- 35% which was decreased from previous of >55% in 05/2020.  Patient had been given Lasix 40 mg IV.  -Admit to a progressive bed -Continuous pulse oximetry -BiPAP and  wean as tolerated -Follow-up ABG -Strict I&Os and daily weights -Chrismon 40 mg IV twice daily per cardiology -No ACE inhibitor/ARB given renal insufficiency and no beta-blocker given soft blood pressures -Cardiology consulted, we will follow-up for further recommendations  SIRS: Patient was noted to be tachycardic and tachypneic with WBC elevated at 13.3. -Check blood cultures -Check lactic acid -Recheck CBC in a.m.  Acute kidney injury superimposed on chronic kidney disease stage IV: Patient with solitary kidney.  Creatinine elevated up to 2.3 with BUN 24.  Suspecting hypoperfusion related with congestive heart failure exacerbation. -Continue to monitor kidney function with IV diuresis  Elevated troponin: Acute on chronic. High-sensitivity troponin 24.  Would suspect likely secondary to demand in the setting of pulmonary edema and respiratory distress.  Previously no invasive intervention was wanted. -Continue to trend  Diabetes mellitus type 2 with hyperglycemia: Acute.  On admission glucose elevated up to 285.  Last hemoglobin A1c was 5.2 on 07/30/2020.  Suspect elevated glucose secondary to acute stress response. -Hypoglycemic protocols -CBGs q. 4 hours with very sensitive SSI -Adjust insulin regimen as needed  Essential hypertension: Blood pressures initially noted to be soft.  Home blood  pressure medications include torsemide 20 mg daily and hydralazine 10 mg every 8 hours. -Hold home blood pressure medications  Paroxysmal atrial fibrillation on chronic anticoagulation: Patient was noted to be in sinus tachycardia.Home regimen includes amiodarone 100 mg daily and Eliquis 2.5 mg twice daily.CHA2DS2-VASc score = at least 3. -Continue current medication regimen -Goal potassium 4 and magnesium 2  Prolonged QT interval: Acute on chronic.  On admission QTC 550. -Correct electrolyte abnormality -Holding additional QT prolonging medications  History of COVID-19 infection: Patient had pneumonia due to COVID-19 on 1/16.  DNR: Present on admission.  DVT prophylaxis: Eliquis Code Status: DNR/DNI Family Communication: Son updated over the phone Disposition Plan: Likely discharge back to skilled nursing facility Consults called: Cardiology Admission status: Inpatient, require more than 2 midnight stay  Norval Morton MD [facility]   If 7PM-7AM, please contact night-coverage   08/11/2020, 7:30 AM

## 2020-08-11 NOTE — ED Notes (Signed)
Unable to obtain second set of blood cultures. Multiple attempts by phlebotomy and this RN.

## 2020-08-11 NOTE — ED Triage Notes (Signed)
Patient from [facility], patient with audible rales in all fields, increased work of breathing, accessory muscle usage.  Patient with decreased mental status, lethargic.  Patient was found in the 70's by EMS on arrival.

## 2020-08-11 NOTE — Progress Notes (Signed)
Date and time results received: 08/11/20 447pm   Test: Lactic acid  Critical Value: 2.3  Name of Provider Notified: [place] MD '@451pm'$   Orders Received? Or Actions Taken?: No new orders received at 453pm

## 2020-08-11 NOTE — ED Provider Notes (Signed)
[facility] EMERGENCY DEPARTMENT Provider Note   CSN: [id number] Arrival date & time: 08/11/20  [id number]     History Chief Complaint  Patient presents with  . Respiratory Distress    Kristin Adkins is a 87 y.o. female.  The history is provided by the EMS personnel. The history is limited by the condition of the patient.  Shortness of Breath Severity:  Severe Onset quality:  Gradual Timing:  Constant Progression:  Worsening Chronicity:  Recurrent Context: not URI   Relieved by:  Nothing Worsened by:  Nothing Ineffective treatments:  None tried Associated symptoms: no cough and no fever   Risk factors: no recent alcohol use   Patient with AFIB and h/o respiratory failure presents from nursing home with respiratory distress.  Rales in all fields.      Past Medical History:  Diagnosis Date  . AF (atrial fibrillation) ([place])   . CHF (congestive heart failure) ([place])   . CKD (chronic kidney disease), stage IV ([place])   . Coronary artery disease   . Hypertension   . Myocardial infarction ([place])   . Respiratory failure [facility])     Patient Active Problem List   Diagnosis Date Noted  . Acute on chronic combined systolic and diastolic CHF (congestive heart failure) ([place]) 07/30/2020  . DNR (do not resuscitate) 07/30/2020  . Acute on chronic respiratory failure with hypoxia ([place]) 07/30/2020  . AF (paroxysmal atrial fibrillation) ([place]) 07/30/2020  . Chronic anticoagulation 07/30/2020  . History of COVID-19 07/30/2020  . Transient hypotension 07/30/2020  . Type 2 diabetes mellitus without complication ([place]) [id number]  . Prolonged QT interval 07/30/2020  . Acute lower UTI 07/30/2020  . Respiratory failure [facility])     Past Surgical History:  Procedure Laterality Date  . 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     History reviewed. No pertinent family  history.  Social History   Tobacco Use  . Smoking status: Never Smoker  . Smokeless tobacco: Never Used  Substance Use Topics  . Alcohol use: Never    Home Medications Prior to Admission medications   Medication Sig Start Date End Date Taking? Authorizing Provider  acetaminophen (TYLENOL) 500 MG tablet Take 500 mg by mouth every 6 (six) hours as needed for fever or mild pain (discomfort).   Yes [provider]  albuterol (PROVENTIL) (2.5 MG/3ML) 0.083% nebulizer solution Take 3 mLs (2.5 mg total) by nebulization every 6 (six) hours as needed for shortness of breath or wheezing. 08/02/20  Yes Geradine Girt, DO  amiodarone (PACERONE) 100 MG tablet Take 100 mg by mouth daily. 07/19/20 08/01/21 Yes [provider]  apixaban (ELIQUIS) 2.5 MG TABS tablet Take 1 tablet (2.5 mg total) by mouth 2 (two) times daily. 08/02/20  Yes Eulogio Bear U, DO  famotidine (PEPCID) 10 MG tablet Take 1 tablet (10 mg total) by mouth daily. 08/02/20 08/02/21 Yes Vann, Jessica U, DO  hydrALAZINE (APRESOLINE) 10 MG tablet Take 1 tablet (10 mg total) by mouth every 8 (eight) hours. 08/02/20  Yes Vann, [facility]) OINT Apply 1 application topically See admin instructions. To buttocks every shift   Yes [provider]  magnesium oxide (MAG-OX) 400 MG tablet Take 400 mg by mouth daily. 07/20/20 07/20/21 Yes [provider]  melatonin 3 MG TABS tablet Take 6 mg by  mouth at bedtime. 07/19/20  Yes [provider]  Multiple Vitamins-Minerals (MULTIVITAMIN WITH MINERALS) tablet Take 1 tablet by mouth daily.   Yes [provider]  Ostomy Supplies (SKIN PREP WIPES) MISC Apply 1 application topically See admin instructions. Apply to bilateral heels every shift   Yes [provider]  potassium chloride SA (KLOR-CON) 20 MEQ tablet Take 1 tablet (20 mEq total) by mouth daily. 08/02/20  Yes Eulogio Bear U, DO  torsemide (DEMADEX) 20 MG tablet  Take 1 tablet (20 mg total) by mouth daily. 08/02/20  Yes Geradine Girt, DO  doxycycline (VIBRA-TABS) 100 MG tablet Take 1 tablet (100 mg total) by mouth 2 (two) times daily. Patient not taking: Reported on 08/11/2020 08/02/20   Geradine Girt, DO    Allergies    Codeine, Nitrofurantoin, and Penicillins  Review of Systems   Review of Systems  Unable to perform ROS: Acuity of condition  Constitutional: Negative for fever.  Respiratory: Positive for shortness of breath. Negative for cough.   Cardiovascular: Negative for leg swelling.    Physical Exam Updated Vital Signs BP 123/63   Pulse (!) 116   Temp 97.6 F (36.4 C) (Axillary)   Resp (!) 34   SpO2 97%   Physical Exam Vitals and nursing note reviewed.  Constitutional:      General: She is in acute distress.     Appearance: She is toxic-appearing. She is not diaphoretic.  HENT:     Head: Normocephalic and atraumatic.     Nose: Nose normal.  Eyes:     Conjunctiva/sclera: Conjunctivae normal.     Pupils: Pupils are equal, round, and reactive to light.  Cardiovascular:     Rate and Rhythm: Normal rate and regular rhythm.     Pulses: Normal pulses.     Heart sounds: Normal heart sounds.  Pulmonary:     Effort: Tachypnea present.     Breath sounds: Rales present.  Abdominal:     General: Abdomen is flat. Bowel sounds are normal.     Tenderness: There is no abdominal tenderness. There is no guarding.  Musculoskeletal:        General: No swelling. Normal range of motion.     Cervical back: Normal range of motion and neck supple.  Skin:    General: Skin is warm and dry.     Capillary Refill: Capillary refill takes less than 2 seconds.  Neurological:     Deep Tendon Reflexes: Reflexes normal.  Psychiatric:     Comments: Unable      ED Results / Procedures / Treatments   Labs (all labs ordered are listed, but only abnormal results are displayed) Results for orders placed or performed during the hospital encounter of  08/11/20  Resp Panel by RT-PCR (Flu A&B, Covid) Nasopharyngeal Swab   Specimen: Nasopharyngeal Swab; Nasopharyngeal(NP) swabs in vial transport medium  Result Value Ref Range   SARS Coronavirus 2 by RT PCR NEGATIVE NEGATIVE   Influenza A by PCR NEGATIVE NEGATIVE   Influenza B by PCR NEGATIVE NEGATIVE  CBC with Differential/Platelet  Result Value Ref Range   WBC 13.3 (H) 4.0 - 10.5 K/uL   RBC 4.62 3.87 - 5.11 MIL/uL   Hemoglobin 13.2 12.0 - 15.0 g/dL   HCT 44.1 36.0 - 46.0 %   MCV 95.5 80.0 - 100.0 fL   MCH 28.6 26.0 - 34.0 pg   MCHC 29.9 (L) 30.0 - 36.0 g/dL   RDW 14.7 11.5 - 15.5 %  Platelets 160 150 - 400 K/uL   nRBC 0.2 0.0 - 0.2 %   Neutrophils Relative % 37 %   Neutro Abs 4.9 1.7 - 7.7 K/uL   Lymphocytes Relative 56 %   Lymphs Abs 7.4 (H) 0.7 - 4.0 K/uL   Monocytes Relative 5 %   Monocytes Absolute 0.7 0.1 - 1.0 K/uL   Eosinophils Relative 0 %   Eosinophils Absolute 0.0 0.0 - 0.5 K/uL   Basophils Relative 0 %   Basophils Absolute 0.1 0.0 - 0.1 K/uL   Immature Granulocytes 2 %   Abs Immature Granulocytes 0.26 (H) 0.00 - 0.07 K/uL  Comprehensive metabolic panel  Result Value Ref Range   Sodium 136 135 - 145 mmol/L   Potassium 4.8 3.5 - 5.1 mmol/L   Chloride 102 98 - 111 mmol/L   CO2 19 (L) 22 - 32 mmol/L   Glucose, Bld 285 (H) 70 - 99 mg/dL   BUN 24 (H) 8 - 23 mg/dL   Creatinine, Ser 2.30 (H) 0.44 - 1.00 mg/dL   Calcium 10.2 8.9 - 10.3 mg/dL   Total Protein 6.5 6.5 - 8.1 g/dL   Albumin 3.2 (L) 3.5 - 5.0 g/dL   AST 43 (H) 15 - 41 U/L   ALT 26 0 - 44 U/L   Alkaline Phosphatase 83 38 - 126 U/L   Total Bilirubin 1.0 0.3 - 1.2 mg/dL   GFR, Estimated 19 (L) >60 mL/min   Anion gap 15 5 - 15  Brain natriuretic peptide  Result Value Ref Range   B Natriuretic Peptide 1,300.9 (H) 0.0 - 100.0 pg/mL  I-stat chem 8, ED (not at [facility] or [facility])  Result Value Ref Range   Sodium 134 (L) 135 - 145 mmol/L   Potassium 6.3 (HH) 3.5 - 5.1 mmol/L   Chloride 104 98 - 111 mmol/L   BUN  41 (H) 8 - 23 mg/dL   Creatinine, Ser 2.10 (H) 0.44 - 1.00 mg/dL   Glucose, Bld 304 (H) 70 - 99 mg/dL   Calcium, Ion 1.23 1.15 - 1.40 mmol/L   TCO2 24 22 - 32 mmol/L   Hemoglobin 14.3 12.0 - 15.0 g/dL   HCT 42.0 36.0 - 46.0 %   Comment NOTIFIED PHYSICIAN   I-Stat arterial blood gas, ED  Result Value Ref Range   pH, Arterial 7.204 (L) 7.350 - 7.450   pCO2 arterial 61.2 (H) 32.0 - 48.0 mmHg   pO2, Arterial 88 83.0 - 108.0 mmHg   Bicarbonate 24.3 20.0 - 28.0 mmol/L   TCO2 26 22 - 32 mmol/L   O2 Saturation 95.0 %   Acid-base deficit 5.0 (H) 0.0 - 2.0 mmol/L   Sodium 136 135 - 145 mmol/L   Potassium 5.1 3.5 - 5.1 mmol/L   Calcium, Ion 1.40 1.15 - 1.40 mmol/L   HCT 43.0 36.0 - 46.0 %   Hemoglobin 14.6 12.0 - 15.0 g/dL   Patient temperature 97.6 F    Collection site Radial    Drawn by RT    Sample type ARTERIAL   Troponin I (High Sensitivity)  Result Value Ref Range   Troponin I (High Sensitivity) 24 (H) <18 ng/L   DG Chest Portable 1 View  Result Date: 08/11/2020 CLINICAL DATA:  Shortness of breath EXAM: PORTABLE CHEST 1 VIEW COMPARISON:  07/31/2020 FINDINGS: Cardiac shadow is prominent but stable. Aortic calcifications are again identified. Bilateral pleural effusions are again identified relatively stable from the prior exam. Increasing vascular congestion and parenchymal edema is seen  as well. No bony abnormality is noted. IMPRESSION: Changes consistent with increasing CHF and small bilateral pleural effusions. Electronically Signed   By: Inez Catalina M.D.   On: 08/11/2020 05:11   DG Chest Port 1 View  Result Date: 07/31/2020 CLINICAL DATA:  Shortness of breath EXAM: PORTABLE CHEST 1 VIEW COMPARISON:  Yesterday FINDINGS: Right more than left pleural effusion and adjacent pulmonary opacity, presumably atelectasis based on January 2022 chest CT. Cardiomegaly. Negative for pneumothorax or convincing edema. IMPRESSION: Right more than left pleural effusion. Increased chest opacity from  before may be from differences in positioning and layering. Electronically Signed   By: Monte Fantasia M.D.   On: 07/31/2020 08:11   DG Chest Portable 1 View  Result Date: 07/30/2020 CLINICAL DATA:  Shortness of breath.  Respiratory distress EXAM: PORTABLE CHEST 1 VIEW COMPARISON:  07/15/2020 FINDINGS: Chronic cardiomegaly. Hazy opacity at the bases primarily attributed to pleural effusion. There is superimposed pulmonary opacity that could be atelectasis or pneumonia. Upper lung markings are similar to before. No pneumothorax. IMPRESSION: Cardiomegaly, vascular congestion, and bilateral pleural effusion. Atelectasis or infiltrate may be superimposed at the lower lobes. Electronically Signed   By: Monte Fantasia M.D.   On: 07/30/2020 04:52    EKG See muse  Radiology DG Chest Portable 1 View  Result Date: 08/11/2020 CLINICAL DATA:  Shortness of breath EXAM: PORTABLE CHEST 1 VIEW COMPARISON:  07/31/2020 FINDINGS: Cardiac shadow is prominent but stable. Aortic calcifications are again identified. Bilateral pleural effusions are again identified relatively stable from the prior exam. Increasing vascular congestion and parenchymal edema is seen as well. No bony abnormality is noted. IMPRESSION: Changes consistent with increasing CHF and small bilateral pleural effusions. Electronically Signed   By: Inez Catalina M.D.   On: 08/11/2020 05:11    Procedures Procedures   Medications Ordered in ED Medications  furosemide (LASIX) injection 40 mg (40 mg Intravenous Given 08/11/20 0507)    ED Course  I have reviewed the triage vital signs and the nursing notes.  Pertinent labs & imaging results that were available during my care of the patient were reviewed by me and considered in my medical decision making (see chart for details).   MDM Reviewed: previous chart, nursing note and vitals Interpretation: labs, ECG and x-ray Total time providing critical care: 30-74 minutes (bipap ). This excludes time  spent performing separately reportable procedures and services. Consults: admitting MD  CRITICAL CARE Performed by: Ole Lafon K Lauren Modisette-Rasch Total critical care time: 60  minutes Critical care time was exclusive of separately billable procedures and treating other patients. Critical care was necessary to treat or prevent imminent or life-threatening deterioration. Critical care was time spent personally by me on the following activities: development of treatment plan with patient and/or surrogate as well as nursing, discussions with consultants, evaluation of patient's response to treatment, examination of patient, obtaining history from patient or surrogate, ordering and performing treatments and interventions, ordering and review of laboratory studies, ordering and review of radiographic studies, pulse oximetry and re-evaluation of patient's condition.  The patient appears reasonably stabilized for admission considering the current resources, flow, and capabilities available in the ED at this time, and I doubt any other [facility] requiring further screening and/or treatment in the ED prior to admission.   Kristin Adkins was evaluated in Emergency Department on 08/11/2020 for the symptoms described in the history of present illness. She was evaluated in the context of the global COVID-19 pandemic, which necessitated consideration that the patient might  be at risk for infection with the SARS-CoV-2 virus that causes COVID-19. Institutional protocols and algorithms that pertain to the evaluation of patients at risk for COVID-19 are in a state of rapid change based on information released by regulatory bodies including the CDC and federal and state organizations. These policies and algorithms were followed during the patient's care in the ED.  Final Clinical Impression(s) / ED Diagnoses Final diagnoses:  Pleural effusion  Acute pulmonary edema (HCC)  Acute renal failure, unspecified acute renal failure type  [facility])   Admit to medicine    Ebonique Hallstrom, MD 08/11/20 [id number]

## 2020-08-11 NOTE — Consult Note (Addendum)
Cardiology Consultation:   Patient ID: Kristin Adkins MRN: [id number]; DOB: 13-Apr-1921  Admit date: 08/11/2020 Date of Consult: 08/11/2020  PCP:  Jene Every, MD   [place]  Cardiologist:  [facility] (Dr. Dimas [place]) Electrophysiologist:  None   Patient Profile:   Kristin Adkins is a 87 y.o. female with a history of presumed CAD based on abnormal nuclear stress test in 2013, chronic combined CHF with EF of 30-35% on Echo in 07/2020 at [facility], atrial fibrillation on Amiodarone and Eliquis, hypertension, solitary kidney with CKD stage IV, recent COVID-19 pneumonia in 06/2020, and mild dementia who is being seen today for the evaluation of CHF at the request of Dr. Tamala Julian.  History of Present Illness:   Kristin Adkins is a 87 year old female with the above history who is followed by Dr. Dwyane Dee for her cardiac care. She has a history of presumed ischemic cardiomyopathy given abnormal nuclear stress test in 2013 with chronic combined CHF per notes in Care Everywhere. It does not look like she has ever had a cardiac catheterization. Patient was last seen by Dr. Dwyane Dee in 02/2020 at which time she continued to report weakness extremities. Recent lower extremity ultrasound prior to visit showed no PAD.   Patient has had multiple admissions over the last couple of months. She was admitted from 06/01/2020 to 06/13/2020 at [facility] with acute on chronic CHF and flash pulmonary edema. Also noted to be atrial fibrillation with RVR on presentation. She was treated with BiPAP, IV Lasix, and IV Cardizem. Echo earlier in 05/2020 showed showed LVEF of >55% with grade 2 diastolic dysfunction, mild TR, and mildly elevated PASP of 42 mmHg. She was discharged on PO Lasix and Metoprolol. She was not started on anticoagulation during admission due to advanced age and fall risk. She had some issues with orthostatic hypotension during admission which improved when Amlodipine was stopped. She  was readmitted from 06/25/2020 to 06/29/2020 at [facility] with COVID pneumonia and was treated with Remdesivir and steroids. She was admitted to [facility] again from 07/15/2020 to 07/19/2020 for acute on chronic CHF and atrial fibrillation with RVR. CHF exacerbation felt to be secondary to atrial fibrillation. She was diuresed with IV Lasix and started on IV Amiodarone and reduced dose Eliquis and thankfully converted to sinus rhythm. She was discharged on PO Amiodarone. Echo showed LVEF of 30-35% with akinesis of interior and inferolateral segments, grade 2 diastolic dysfunction, moderate MR, mild TR. Troponin did peak at 3.09 that admission and EKG showed new ST depressions. This was felt to be due to supply/demand mismatch rather than acute plaque rupture. Cardiac catheterization was not pursued due to advanced age and CKD. Patient was most recently admitted at [facility] from 07/30/2020 to 08/02/2020 for acute hypoxic respiratory failure due to chronic combined CHF. She was markedly elevated on admission. She was diuresed with IV Lasix. She was discharged on Torsemide 16m daily, Amiodarone 10760mdaily, Eliquis 2.60m7mwice daily, and Hydralazine 64m22mree times daily.  Patient presents to the ED via EMS from [facility] acute respiratory distress and decreased mental status. O2 sats in the 70's on EMS arrival.In the ED, patient tachycardic and tachypneic. O2 sats in the high 90's on BiPAP. EKG showed sinus tachycardia, rate 123 bpm, with known LBBB. BNP elevated at 1,300. High-sensitivity troponin 24 >> 196. Chest x-ray showed increasing vascular congestion and parenchymal edema as well as small  bilateral pleural effusion. WBC 13.3, Hgb 13.2,  Plts 160. Na 136, K 4.8, Glucose 285, BUN 24, Cr 2.30. Albumin 3.2, AST 43, ALT 26, Alk Phos 83. ABG showed pH 7.2, pCO2 61.2, pO2 88, BiCarb 24.3. Patient was given a dose of IV Lasix and was admitted. Cardiology consulted for assistance.  At the  time of this evaluation, patient of BiPAP and on nasal cannula. Appears relatively comfortable. She reports some chronic shortness of breath lately but states this acutely worsened this morning. She denied any chest pain. No fevers or recent cough. Detailed history limited by patient's baseline memory issues.   Past Medical History:  Diagnosis Date  . AF (atrial fibrillation) ([place])   . CHF (congestive heart failure) ([place])   . CKD (chronic kidney disease), stage IV ([place])   . Coronary artery disease   . Hypertension   . Myocardial infarction ([place])   . Respiratory failure [facility])     Past Surgical History:  Procedure Laterality Date  . 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 Authorizing Provider  acetaminophen (TYLENOL) 500 MG tablet Take 500 mg by mouth every 6 (six) hours as needed for fever or mild pain (discomfort).   Yes [provider]  albuterol (PROVENTIL) (2.5 MG/3ML) 0.083% nebulizer solution Take 3 mLs (2.5 mg total) by nebulization every 6 (six) hours as needed for shortness of breath or wheezing. 08/02/20  Yes Geradine Girt, DO  amiodarone (PACERONE) 100 MG tablet Take 100 mg by mouth daily. 07/19/20 08/01/21 Yes [provider]  apixaban (ELIQUIS) 2.5 MG TABS tablet Take 1 tablet (2.5 mg total) by mouth 2 (two) times daily. 08/02/20  Yes Eulogio Bear U, DO  famotidine (PEPCID) 10 MG tablet Take 1 tablet (10 mg total) by mouth daily. 08/02/20 08/02/21 Yes Vann, Jessica U, DO  hydrALAZINE (APRESOLINE) 10 MG tablet Take 1 tablet (10 mg total) by mouth every 8 (eight) hours. 08/02/20  Yes Vann, [place]) OINT Apply 1 application topically See admin instructions. To buttocks every shift   Yes [provider]  magnesium oxide (MAG-OX) 400 MG tablet Take 400 mg by mouth daily.  07/20/20 07/20/21 Yes [provider]  melatonin 3 MG TABS tablet Take 6 mg by mouth at bedtime. 07/19/20  Yes [provider]  Multiple Vitamins-Minerals (MULTIVITAMIN WITH MINERALS) tablet Take 1 tablet by mouth daily.   Yes [provider]  Ostomy Supplies (SKIN PREP WIPES) MISC Apply 1 application topically See admin instructions. Apply to bilateral heels every shift   Yes [provider]  potassium chloride SA (KLOR-CON) 20 MEQ tablet Take 1 tablet (20 mEq total) by mouth daily. 08/02/20  Yes Eulogio Bear U, DO  torsemide (DEMADEX) 20 MG tablet Take 1 tablet (20 mg total) by mouth daily. 08/02/20  Yes Geradine Girt, DO  doxycycline (VIBRA-TABS) 100 MG tablet Take 1 tablet (100 mg total) by mouth 2 (two) times daily. Patient not taking: Reported on 08/11/2020 08/02/20   Geradine Girt, DO    Inpatient Medications: Scheduled Meds: . amiodarone  100 mg Oral Daily  . apixaban  2.5 mg Oral BID  . [START ON 08/12/2020] furosemide  40 mg Intravenous Daily  . insulin aspart  0-6 Units Subcutaneous Q4H  . [START ON 08/12/2020] magnesium oxide  400 mg Oral Daily  . melatonin  6 mg Oral QHS  . sodium chloride flush  3 mL Intravenous Q12H  . zinc oxide  1 application Topical BID   Continuous Infusions: . sodium chloride     PRN Meds: sodium chloride, acetaminophen, LORazepam, sodium chloride flush  Allergies:    Allergies  Allergen Reactions  . Codeine     Other reaction(s): UNKNOWN  . Nitrofurantoin     Other reaction(s): UNKNOWN  . Penicillins Rash    Social History:   Social History   Socioeconomic History  . Marital status: Married    Spouse name: Not on file  . Number of children: Not on file  . Years of education: Not on file  . Highest education level: Not on file  Occupational History  . Not on file  Tobacco Use  . Smoking status: Never Smoker  . Smokeless tobacco: Never Used  Substance and Sexual Activity  . Alcohol use: Never  . Drug  use: Not on file  . Sexual activity: Not on file  Other Topics Concern  . Not on file  Social History Narrative  . Not on file   Social Determinants of Health   Financial Resource Strain: Not on file  Food Insecurity: Not on file  Transportation Needs: Not on file  Physical Activity: Not on file  Stress: Not on file  Social Connections: Not on file  Intimate Partner Violence: Not on file    Family History:   Detailed family history difficult to obtain given baseline dementia.   ROS:  Please see the history of present illness.  Detailed ROS difficult to obtain given baseline dementia.  Physical Exam/Data:   Vitals:   08/11/20 1400 08/11/20 1415 08/11/20 1430 08/11/20 1445  BP: (!) 117/46 (!) 116/46 104/85 (!) 109/53  Pulse: 79 77 79 82  Resp: 14 (!) 24 (!) 32 (!) 21  Temp:      TempSrc:      SpO2: 100% 100% 100% 95%    Intake/Output Summary (Last 24 hours) at 08/11/2020 1530 Last data filed at 08/11/2020 1341 Gross per 24 hour  Intake --  Output 100 ml  Net -100 ml   Last 3 Weights 08/02/2020 08/01/2020 07/30/2020  Weight (lbs) 158 lb 1.1 oz 158 lb 8.2 oz 169 lb 12.1 oz  Weight (kg) 71.7 kg 71.9 kg 77 kg     There is no height or weight on file to calculate BMI.  General: 87 y.o. female resting comfortably in no acute distress. HEENT: Normocephalic and atraumatic. Sclera clear. EOMs intact. Neck: Supple. No JVD. Heart: RRR. Distinct S1 and S2. No murmurs, gallops, or rubs. Radial and distal pedal pulses 2+ and equal bilaterally. Lungs: Decreased breath sounds in bases with bibasilar crackles. Abdomen: Soft, non-distended, and non-tender to palpation.  Extremities: Trace lower extremity edema bilaterally (left slightly larger than right) Skin: Warm and dry. Neuro: No focal deficits. Psych: Normal affect. Responds appropriately.   EKG:  The EKG was personally reviewed and demonstrates:  Sinus tachycardia, rate 123 bpm, with 1st degree AV block and known  LBBB. Telemetry:  Telemetry was personally reviewed and demonstrates:  Sinus rhythm.  Relevant CV Studies:  Echocardiogram 07/18/2020: Summary  1. The left ventricle is mildly dilated in size with normal wall thickness.  2. The left ventricular systolic function ismoderately to severely  decreased, LVEF is visually estimated at 30-35%.  3. The inferior and inferolateral segments are akinetic (similar to prior).  4. There is grade II diastolic dysfunction (elevated filling pressure).  5. The right ventricle is normal in size, with normal systolic function.  6. The left atrium is moderately dilated in size.  7. The right atrium is mildly dilated in size.  8. There is moderate mitral valve regurgitation.  9. There is mild tricuspid regurgitation.  10. At least moderate pulmonary hypertension with estimated PA systolic pressure >16 mmHg.     Laboratory Data:  High Sensitivity Troponin:   Recent Labs  Lab 07/30/20 0420 07/30/20 0615 08/11/20 0536 08/11/20 1015  TROPONINIHS 29* 50* 24* 196*     Chemistry Recent Labs  Lab 08/11/20 0536 08/11/20 0617  NA 136  134* 136  K 4.8  6.3* 5.1  CL 102  104  --   CO2 19*  --   GLUCOSE 285*  304*  --   BUN 24*  41*  --   CREATININE 2.30*  2.10*  --   CALCIUM 10.2  --   GFRNONAA 19*  --   ANIONGAP 15  --     Recent Labs  Lab 08/11/20 0536  PROT 6.5  ALBUMIN 3.2*  AST 43*  ALT 26  ALKPHOS 83  BILITOT 1.0   Hematology Recent Labs  Lab 08/11/20 0536 08/11/20 0617  WBC 13.3*  --   RBC 4.62  --   HGB 13.2  14.3 14.6  HCT 44.1  42.0 43.0  MCV 95.5  --   MCH 28.6  --   MCHC 29.9*  --   RDW 14.7  --   PLT 160  --    BNP Recent Labs  Lab 08/11/20 0536  BNP 1,300.9*    DDimer No results for input(s): DDIMER in the last 168 hours.   Radiology/Studies:  DG Chest Portable 1 View  Result Date: 08/11/2020 CLINICAL DATA:  Shortness of breath EXAM: PORTABLE CHEST 1 VIEW COMPARISON:  07/31/2020  FINDINGS: Cardiac shadow is prominent but stable. Aortic calcifications are again identified. Bilateral pleural effusions are again identified relatively stable from the prior exam. Increasing vascular congestion and parenchymal edema is seen as well. No bony abnormality is noted. IMPRESSION: Changes consistent with increasing CHF and small bilateral pleural effusions. Electronically Signed   By: Inez Catalina M.D.   On: 08/11/2020 05:11     Assessment and Plan:   Acute on Chronic Combined CHF - Patient presents with acute hypoxic respiratory failure secondary to acute CHF. O2 sats initially in the 70's. Briefly required BiPAP. Now on nasal cannula. - BNP elevated in the 1,300's. - Chest x-ray consistent with CHF. - Recent Echo from 07/18/2020 at [facility] showed LVEF of 30-35% with akinesis of interior and inferolateral segments, grade 2 diastolic dysfunction, moderate MR, mild TR. Reduced EF and wall motion abnormalities new from prior Echo in 05/2020.  - Will increase IV Lasix to 41m twice daily. - No ACEI/ARB/ARNI due to renal function and soft BP. - No beta-blocker in setting of acute CHF and soft BP. - Continue to monitor daily weight, strict I/O's, and renal function.  Paroxysmal Atrial Fibrillation - Maintaining sinus rhythm at this time. - Continue Amiodarone 10431mdaily. - Continue Eliquis 2.31m42mwice daily.   Elevated Troponin Presumed CAD - Patient has presumed CAD based on abnormal nuclear stress test in 2013. - High-sensitivity troponin 24 >> 196. - EKG shows inus tachycardia with LBBB.  - Echo as above. - Patient denies any chest pain. - Troponin elevation likely demand ischemia in setting of acute CHF. - No ischemic evaluation planned given advanced age, dementia, and CKD  stage IV.   Hypertension History of Orthostatic Hypotension - BP soft but stable. Systolic BP ranging from high 90's to 120's.  - Will hold home Hydralazine for now to allow for more aggressive  diuresis.  Solitary Kidney with CKD Stage IV - Creatinine 2.10 on admission and 2.30 on repeat today. Creatinine was in 1.2 to 1.5 range in 05/2020 and has been steadily increasing since then. - Avoid nephrotoxic agents.  - Continue to monitor closely with diuresis.  Otherwise, per primary team: - SIRS - Type 2 diabetes mellitus   Risk Assessment/Risk Scores:   New York Heart Association (NYHA) Functional Class NYHA Class III-IV   CHA2DS2-VASc Score = 7  This indicates a 11.2% annual risk of stroke. The patient's score is based upon: CHF History: Yes HTN History: Yes Diabetes History: Yes Stroke History: No Vascular Disease History: Yes Age Score: 2 Gender Score: 1   For questions or updates, please contact [place] Please consult [email or web address] for contact info under    Signed, Darreld Mclean, PA-C  08/11/2020 3:30 PM As above, patient seen and examined.  Briefly she is a 87 year old female with past medical history of presumed coronary disease, chronic combined systolic/diastolic congestive heart failure, paroxysmal atrial fibrillation, hypertension, chronic stage IV kidney disease, recent Covid pneumonia for evaluation of acute on chronic combined systolic/diastolic congestive heart failure.  Most recent echocardiogram February 2022 showed ejection fraction 30 to 35% with akinesis of the inferior and inferolateral walls, grade 2 diastolic dysfunction, moderate left atrial enlargement, mild right atrial enlargement, moderate mitral regurgitation, mild tricuspid regurgitation.  Patient has had multiple admissions for congestive heart failure recently.  She states she has chronic dyspnea which worsened on the day of admission.  She denies chest pain.  No fevers, chills or productive cough.  No hemoptysis.  Admitted with heart failure and cardiology asked to evaluate.  On exam patient has diminished breath sounds and crackles in the bases.  Trace lower extremity edema. BNP  1300, BUN 24, creatinine 2.30, hemoglobin 13.2.  Chest x-ray shows edema and bilateral pleural effusions.  Electrocardiogram shows sinus tachycardia, first-degree AV block, left bundle branch block which is old.  1 acute on chronic combined systolic/diastolic congestive heart failure-patient presents with pulmonary edema and volume excess.  We will treat with Lasix 40 mg IV twice daily.  Follow renal function closely.  Most recent echocardiogram showed ejection fraction 30 to 35%.  2 presumed ischemic cardiomyopathy-we will not add an ACE inhibitor, ARB or Entresto due to renal insufficiency and borderline blood pressure.  We will also not add a beta-blocker due to acute CHF and borderline blood pressure.  Can consider hydralazine/nitrates later if blood pressure allows.  3 chronic stage III kidney disease-follow renal function closely with diuresis.  She has had previous nephrectomy.  4 paroxysmal atrial fibrillation-she remains in sinus rhythm.  Continue amiodarone and preadmission dose.  Continue apixaban.  Given age, comorbidities and multiple recent admissions would ask palliative care to reassess.  Needs hospice care.  Kirk Ruths MD

## 2020-08-11 NOTE — Progress Notes (Signed)
Pt taken off bipap and placed on 3L [place] at this time. Pt denies SOB no increased WOB, VS WNL

## 2020-08-11 NOTE — Progress Notes (Signed)
Per RT assessment: pt was not placed on bipap for the night. Pt stable on 3L [place], no complaints of SOB or increased WOB.

## 2020-08-12 ENCOUNTER — Inpatient Hospital Stay (HOSPITAL_COMMUNITY): Payer: Medicare Other

## 2020-08-12 DIAGNOSIS — Z515 Encounter for palliative care: Secondary | ICD-10-CM

## 2020-08-12 DIAGNOSIS — I5043 Acute on chronic combined systolic (congestive) and diastolic (congestive) heart failure: Secondary | ICD-10-CM

## 2020-08-12 DIAGNOSIS — L8990 Pressure ulcer of unspecified site, unspecified stage: Secondary | ICD-10-CM | POA: Insufficient documentation

## 2020-08-12 DIAGNOSIS — Z7189 Other specified counseling: Secondary | ICD-10-CM

## 2020-08-12 LAB — BASIC METABOLIC PANEL
Anion gap: 10 (ref 5–15)
BUN: 28 mg/dL — ABNORMAL HIGH (ref 8–23)
CO2: 24 mmol/L (ref 22–32)
Calcium: 9.5 mg/dL (ref 8.9–10.3)
Chloride: 102 mmol/L (ref 98–111)
Creatinine, Ser: 2.34 mg/dL — ABNORMAL HIGH (ref 0.44–1.00)
GFR, Estimated: 18 mL/min — ABNORMAL LOW (ref >60)
Glucose, Bld: 92 mg/dL (ref 70–99)
Potassium: 4.9 mmol/L (ref 3.5–5.1)
Sodium: 136 mmol/L (ref 135–145)

## 2020-08-12 LAB — ECHOCARDIOGRAM COMPLETE
AR max vel: 1.66 cm2
AV Area VTI: 1.21 cm2
AV Area mean vel: 1.39 cm2
AV Mean grad: 4 mmHg
AV Peak grad: 8.1 mmHg
Ao pk vel: 1.42 m/s
Area-P 1/2: 4.06 cm2
Calc EF: 28.1 %
MV M vel: 4.54 m/s
MV Peak grad: 82.4 mmHg
Radius: 0.4 cm
S' Lateral: 4.7 cm
Single Plane A2C EF: 40.5 %
Single Plane A4C EF: 12 %
Weight: 2518.54 oz

## 2020-08-12 LAB — CBC WITH DIFFERENTIAL/PLATELET
Abs Immature Granulocytes: 0.05 10*3/uL (ref 0.00–0.07)
Abs Immature Granulocytes: 0.04 10*3/uL (ref 0.00–0.07)
Basophils Absolute: 0 10*3/uL (ref 0.0–0.1)
Basophils Absolute: 0 10*3/uL (ref 0.0–0.1)
Basophils Relative: 0 %
Basophils Relative: 0 %
Eosinophils Absolute: 0 10*3/uL (ref 0.0–0.5)
Eosinophils Absolute: 0 10*3/uL (ref 0.0–0.5)
Eosinophils Relative: 0 %
Eosinophils Relative: 0 %
HCT: 35.1 % — ABNORMAL LOW (ref 36.0–46.0)
HCT: 36.8 % (ref 36.0–46.0)
Hemoglobin: 10.9 g/dL — ABNORMAL LOW (ref 12.0–15.0)
Hemoglobin: 11.4 g/dL — ABNORMAL LOW (ref 12.0–15.0)
Immature Granulocytes: 1 %
Immature Granulocytes: 1 %
Lymphocytes Relative: 24 %
Lymphocytes Relative: 18 %
Lymphs Abs: 1.4 10*3/uL (ref 0.7–4.0)
Lymphs Abs: 0.9 10*3/uL (ref 0.7–4.0)
MCH: 28.6 pg (ref 26.0–34.0)
MCH: 28.4 pg (ref 26.0–34.0)
MCHC: 31.1 g/dL (ref 30.0–36.0)
MCHC: 31 g/dL (ref 30.0–36.0)
MCV: 92.1 fL (ref 80.0–100.0)
MCV: 91.8 fL (ref 80.0–100.0)
Monocytes Absolute: 0.5 10*3/uL (ref 0.1–1.0)
Monocytes Absolute: 0.4 10*3/uL (ref 0.1–1.0)
Monocytes Relative: 9 %
Monocytes Relative: 8 %
Neutro Abs: 3.7 10*3/uL (ref 1.7–7.7)
Neutro Abs: 3.9 10*3/uL (ref 1.7–7.7)
Neutrophils Relative %: 66 %
Neutrophils Relative %: 73 %
Platelets: 108 10*3/uL — ABNORMAL LOW (ref 150–400)
Platelets: 107 10*3/uL — ABNORMAL LOW (ref 150–400)
RBC: 3.81 MIL/uL — ABNORMAL LOW (ref 3.87–5.11)
RBC: 4.01 MIL/uL (ref 3.87–5.11)
RDW: 14.6 % (ref 11.5–15.5)
RDW: 14.5 % (ref 11.5–15.5)
WBC: 5.6 10*3/uL (ref 4.0–10.5)
WBC: 5.3 10*3/uL (ref 4.0–10.5)
nRBC: 0 % (ref 0.0–0.2)
nRBC: 0 % (ref 0.0–0.2)

## 2020-08-12 LAB — TROPONIN I (HIGH SENSITIVITY)
Troponin I (High Sensitivity): 220 ng/L (ref <18)
Troponin I (High Sensitivity): 232 ng/L (ref <18)

## 2020-08-12 LAB — GLUCOSE, CAPILLARY
Glucose-Capillary: 101 mg/dL — ABNORMAL HIGH (ref 70–99)
Glucose-Capillary: 105 mg/dL — ABNORMAL HIGH (ref 70–99)
Glucose-Capillary: 162 mg/dL — ABNORMAL HIGH (ref 70–99)
Glucose-Capillary: 83 mg/dL (ref 70–99)
Glucose-Capillary: 84 mg/dL (ref 70–99)
Glucose-Capillary: 96 mg/dL (ref 70–99)

## 2020-08-12 LAB — LACTIC ACID, PLASMA
Lactic Acid, Venous: 1 mmol/L (ref 0.5–1.9)
Lactic Acid, Venous: 1.6 mmol/L (ref 0.5–1.9)

## 2020-08-12 MED ORDER — OXYCODONE HCL 5 MG PO TABS: 5.0000 mg | ORAL_TABLET | Freq: Four times a day (QID) | ORAL | Status: DC | PRN

## 2020-08-12 NOTE — Progress Notes (Signed)
   Spoke with Dr. Sallyanne Kuster about rising HsT and EKG changes however given age, comorbidities and multiple recent admissions would ask palliative care to reassess. Needs hospice care. No ischemic evaluation planned.    [facility] will sign off.    Kathyrn Drown NP-C [place] Pager: [phone]

## 2020-08-12 NOTE — Progress Notes (Signed)
  Echocardiogram 2D Echocardiogram has been performed.  Kristin Adkins F 08/12/2020, 5:17 PM

## 2020-08-12 NOTE — Progress Notes (Addendum)
At approximately 0915, central monitoring called to inform of elevation in lead V and lead II. EKG ordered and completed; EKG uploaded to [place] paged to bedside; shown EKG. Patient denies chest pain or shortness of breath; she is tachypneic on 2L oxygen by nasal cannula. See new orders.

## 2020-08-12 NOTE — Progress Notes (Signed)
PROGRESS NOTE    Kristin Adkins  [id number] DOB: 11-Nov-1920 DOA: 08/11/2020 PCP: Kristin Every, MD   Brief Narrative:  HPI: Kristin Adkins is a 87 y.o. female with medical history significant of hypertension, combined systolic and diastolic CHF(last EF [id number] with grade 2 diastolic dysfunction), atrial fibrillation on anticoagulation, chronic kidney disease stage IV with solitary kidney, and COVID-19 pneumonia 06/25/2020 who presents with shortness of breath.  History is limited from the patient at this time and son reports that she has some issues with her memory.  Her son had seen her yesterday and noted that she seemed to be doing well and states her weight has been staying around 156 lbs.  However, yesterday evening she was noted to be more short of breath.  Patient denies any fever, chest pain, or cough.  Patient had just been recently hospitalized from 2/20-2/23 with respiratory failure with hypoxia secondary to combined congestive heart failure exacerbation with suspected pneumonia and UTI.  She was treated with antibiotics for underlying possible infections.  She had also been switched from furosemide 40 mg daily to torsemide 20 mg daily by cardiology.  At discharge she was back on room air.  Patient has had several admissions into the hospital recently over the last few months.  Found by EMS with O2 saturations in the 70s and was placed on CPAP.  ED Course: Upon admission to the emergency department patient was seen to be afebrile, pulse 85-1 21, respiration 25-38, blood pressure 98/41-144/63, and O2 saturations currently maintained around 97% on BiPAP.  Labs significant for WBC 13.3, BUN 24, creatinine 2.3, glucose 285, albumin 3.2, AST 43, BNP 1300.9, and troponin 24.  pH 7.204, pCO2Chest x-ray consistent for increasing CHF with small bilateral pleural effusions.  Patient has been given furosemide 40 mg IV.  TRH called to admit.  Assessment & Plan:   Principal Problem:   Acute on  chronic combined systolic and diastolic CHF (congestive heart failure) (HCC) Active Problems:   DNR (do not resuscitate)   Acute on chronic respiratory failure with hypoxia and hypercapnia (HCC)   AF (paroxysmal atrial fibrillation) (HCC)   Chronic anticoagulation   History of COVID-19   SIRS (systemic inflammatory response syndrome) (HCC)   Acute kidney injury superimposed on chronic kidney disease (HCC)   Pressure injury of skin   Acute respiratory failure with hypoxia and hypercapnia secondary to combined systolic and diastolic CHF exacerbation: Patient presents with respiratory distress with O2 saturations noted to be in the 70s.  She was placed on BiPAP.  ABG was significant for pH 7.204, PCO2 61.2, and PO2 88 concerning for a respiratory acidosis.  Chest x-ray significant for increasing CHF with small bilateral pleural effusions.  BNP 1300.9 which is increased from previous admission.  During discharge she had been started on torosemide 20 mg daily by cardiology.  Last echocardiogram on 07/18/2020 noted EF of 30- 35% which was decreased from previous of >55% in 05/2020.  Cardiology saw patient.  They started her on Lasix twice daily.  She feels better.  Very faint crackles at the bases bilaterally.  SIRS: Patient was noted to be tachycardic and tachypneic with WBC elevated at 13.3.  At the time of admission.  All these parameters have normalized now.  No suspicion of infection.  Acute kidney injury superimposed on chronic kidney disease stage IV: Patient with solitary kidney.  Baseline creatinine about 1.9.  Presented with 2.3.  No change from yesterday.  Could be cardiorenal syndrome.  Monitor.  Elevated  troponin/EKG changes: Denies any chest pain or shortness of breath.  Telemetry called for some ST changes on the leads.  EKG was done.  This shows ST elevation on V1 through V3 with reciprocal ST depression from V4 to V6.  This was brought up to my attention by the RN at the bedside.   Despite of this, patient has no symptoms.  Cardiology is on board.  I advised RN to page cardiology about this.  Noted that patient's troponin jumped from 24 yesterday to 196 today which is concerning.  I have ordered transthoracic echo and 2 more sets of troponin.  Monitor on telemetry.  Diabetes mellitus type 2 with hyperglycemia: Acute.  On admission glucose elevated up to 285.  Last hemoglobin A1c was 5.2 on 07/30/2020.  Suspect elevated glucose secondary to acute stress response.  Currently blood sugar within normal range.  Continue SSI.  Essential hypertension: Blood pressures initially noted to be soft.  Home blood pressure medications include torsemide 20 mg daily and hydralazine 10 mg Adkins 8 hours.  Continue to hold home antihypertensives.  Paroxysmal atrial fibrillation on chronic anticoagulation: Currently in sinus rhythm.  Continue Eliquis and amiodarone.   Prolonged QT interval: Acute on chronic.  On admission QTC 550.  Keep electrolytes within normal range. -Holding additional QT prolonging medications.  Repeat EKG tomorrow morning.  History of COVID-19 infection: Patient had pneumonia due to COVID-19 on 1/16.  DNR: Present on admission.  Frequent hospitalizations: Consulted palliative care for goals of care discussion.  DVT prophylaxis: apixaban (ELIQUIS) tablet 2.5 mg Start: 08/11/20 1000   Code Status: DNR  Family Communication: None present at bedside.  Will call son later.  Status is: Inpatient  Remains inpatient appropriate because:Inpatient level of care appropriate due to severity of illness   Dispo: The patient is from: SNF              Anticipated d/c is to: SNF              Patient currently is not medically stable to d/c.   Difficult to place patient No        Estimated body mass index is 28.79 kg/m as calculated from the following:   Height as of 07/30/20: '5\' 2"'$  (1.575 m).   Weight as of this encounter: 71.4 kg.  Pressure Injury 08/11/20 Heel  Right Stage 1 -  Intact skin with non-blanchable redness of a localized area usually over a bony prominence. nonblanchable area to right heel (Active)  08/11/20 1557  Location: Heel  Location Orientation: Right  Staging: Stage 1 -  Intact skin with non-blanchable redness of a localized area usually over a bony prominence.  Wound Description (Comments): nonblanchable area to right heel  Present on Admission: Yes     Nutritional status:               Consultants:   Cardiology  Palliative medicine  Procedures:   None  Antimicrobials:  Anti-infectives (From admission, onward)   None         Subjective: Seen and examined earlier this morning and again when there were EKG changes.  Patient stated that she was feeling better.  She had no chest pain or shortness of breath.  She was concerned about her not having teeth because she wanted to eat her breakfast.  Notified primary RN.  Objective: Vitals:   08/12/20 0348 08/12/20 0500 08/12/20 0749 08/12/20 0800  BP: (!) 127/53  122/63   Pulse: 76  Resp: 20     Temp: 98.7 F (37.1 C)  98.1 F (36.7 C)   TempSrc: Oral  Oral   SpO2: 96%   94%  Weight:  71.4 kg      Intake/Output Summary (Last 24 hours) at 08/12/2020 1036 Last data filed at 08/12/2020 0800 Gross per 24 hour  Intake --  Output 350 ml  Net -350 ml   Filed Weights   08/11/20 1541 08/12/20 0500  Weight: 70 kg 71.4 kg    Examination:  General exam: Appears calm and comfortable  Respiratory system: Faint crackles at the bases bilaterally respiratory effort normal. Cardiovascular system: S1 & S2 heard, RRR. No JVD, murmurs, rubs, gallops or clicks. No pedal edema. Gastrointestinal system: Abdomen is nondistended, soft and nontender. No organomegaly or masses felt. Normal bowel sounds heard. Central nervous system: Alert and oriented. No focal neurological deficits. Extremities: Symmetric 5 x 5 power. Skin: No rashes, lesions or ulcers Psychiatry:  Judgement and insight appear poor   Data Reviewed: I have personally reviewed following labs and imaging studies  CBC: Recent Labs  Lab 08/11/20 0536 08/11/20 0617 08/12/20 0131  WBC 13.3*  --  5.6  NEUTROABS 4.9  --  3.7  HGB 13.2  14.3 14.6 10.9*  HCT 44.1  42.0 43.0 35.1*  MCV 95.5  --  92.1  PLT 160  --  123XX123*   Basic Metabolic Panel: Recent Labs  Lab 08/11/20 0536 08/11/20 0617 08/11/20 1015 08/12/20 0131  NA 136  134* 136  --  136  K 4.8  6.3* 5.1  --  4.9  CL 102  104  --   --  102  CO2 19*  --   --  24  GLUCOSE 285*  304*  --   --  92  BUN 24*  41*  --   --  28*  CREATININE 2.30*  2.10*  --   --  2.34*  CALCIUM 10.2  --   --  9.5  MG  --   --  2.4  --    GFR: Estimated Creatinine Clearance: 12.7 mL/min (A) (by C-G formula based on SCr of 2.34 mg/dL (H)). Liver Function Tests: Recent Labs  Lab 08/11/20 0536  AST 43*  ALT 26  ALKPHOS 83  BILITOT 1.0  PROT 6.5  ALBUMIN 3.2*   No results for input(s): LIPASE, AMYLASE in the last 168 hours. No results for input(s): AMMONIA in the last 168 hours. Coagulation Profile: No results for input(s): INR, PROTIME in the last 168 hours. Cardiac Enzymes: No results for input(s): CKTOTAL, CKMB, CKMBINDEX, TROPONINI in the last 168 hours. BNP (last 3 results) No results for input(s): PROBNP in the last 8760 hours. HbA1C: No results for input(s): HGBA1C in the last 72 hours. CBG: Recent Labs  Lab 08/11/20 1623 08/11/20 1948 08/12/20 0002 08/12/20 0348 08/12/20 0747  GLUCAP 99 112* 83 96 84   Lipid Profile: No results for input(s): CHOL, HDL, LDLCALC, TRIG, CHOLHDL, LDLDIRECT in the last 72 hours. Thyroid Function Tests: No results for input(s): TSH, T4TOTAL, FREET4, T3FREE, THYROIDAB in the last 72 hours. Anemia Panel: No results for input(s): VITAMINB12, FOLATE, FERRITIN, TIBC, IRON, RETICCTPCT in the last 72 hours. Sepsis Labs: Recent Labs  Lab 08/11/20 1534 08/12/20 0830  LATICACIDVEN 2.3*  1.0    Recent Results (from the past 240 hour(s))  Resp Panel by RT-PCR (Flu A&B, Covid) Nasopharyngeal Swab     Status: None   Collection Time: 08/11/20  4:42 AM  Specimen: Nasopharyngeal Swab; Nasopharyngeal(NP) swabs in vial transport medium  Result Value Ref Range Status   SARS Coronavirus 2 by RT PCR NEGATIVE NEGATIVE Final    Comment: (NOTE) SARS-CoV-2 target nucleic acids are NOT DETECTED.  The SARS-CoV-2 RNA is generally detectable in upper respiratory specimens during the acute phase of infection. The lowest concentration of SARS-CoV-2 viral copies this assay can detect is 138 copies/mL. A negative result does not preclude SARS-Cov-2 infection and should not be used as the sole basis for treatment or other patient management decisions. A negative result may occur with  improper specimen collection/handling, submission of specimen other than nasopharyngeal swab, presence of viral mutation(s) within the areas targeted by this assay, and inadequate number of viral copies(<138 copies/mL). A negative result must be combined with clinical observations, patient history, and epidemiological information. The expected result is Negative.  Fact Sheet for Patients:  EntrepreneurPulse.com.au  Fact Sheet for Healthcare Providers:  IncredibleEmployment.be  This test is no t yet approved or cleared by the [place] FDA and  has been authorized for detection and/or diagnosis of SARS-CoV-2 by FDA under an Emergency Use Authorization (EUA). This EUA will remain  in effect (meaning this test can be used) for the duration of the COVID-19 declaration under Section 564(b)(1) of the Act, 21 U.S.C.section 360bbb-3(b)(1), unless the authorization is terminated  or revoked sooner.       Influenza A by PCR NEGATIVE NEGATIVE Final   Influenza B by PCR NEGATIVE NEGATIVE Final    Comment: (NOTE) The Xpert Xpress SARS-CoV-2/FLU/RSV plus assay is intended  as an aid in the diagnosis of influenza from Nasopharyngeal swab specimens and should not be used as a sole basis for treatment. Nasal washings and aspirates are unacceptable for Xpert Xpress SARS-CoV-2/FLU/RSV testing.  Fact Sheet for Patients: EntrepreneurPulse.com.au  Fact Sheet for Healthcare Providers: IncredibleEmployment.be  This test is not yet approved or cleared by the [place] FDA and has been authorized for detection and/or diagnosis of SARS-CoV-2 by FDA under an Emergency Use Authorization (EUA). This EUA will remain in effect (meaning this test can be used) for the duration of the COVID-19 declaration under Section 564(b)(1) of the Act, 21 U.S.C. section 360bbb-3(b)(1), unless the authorization is terminated or revoked.  Performed at [facility], [place] [address], [place], [place] [address]   Culture, blood (routine x 2)     Status: None (Preliminary result)   Collection Time: 08/11/20 10:15 AM   Specimen: BLOOD LEFT HAND  Result Value Ref Range Status   Specimen Description BLOOD LEFT HAND  Final   Special Requests   Final    BOTTLES DRAWN AEROBIC AND ANAEROBIC Blood Culture results may not be optimal due to an inadequate volume of blood received in culture bottles   Culture   Final    NO GROWTH < 24 HOURS Performed at [facility], [place] [address], [place],  [address]    Report Status PENDING  Incomplete  Culture, blood (routine x 2)     Status: None (Preliminary result)   Collection Time: 08/11/20  3:34 PM   Specimen: BLOOD RIGHT HAND  Result Value Ref Range Status   Specimen Description BLOOD RIGHT HAND  Final   Special Requests   Final    BOTTLES DRAWN AEROBIC ONLY Blood Culture results may not be optimal due to an inadequate volume of blood received in culture bottles   Culture   Final    NO GROWTH < 24 HOURS Performed  at [facility], [place] [address], [place], [place] [address]    Report  Status PENDING  Incomplete      Radiology Studies: DG Chest Portable 1 View  Result Date: 08/11/2020 CLINICAL DATA:  Shortness of breath EXAM: PORTABLE CHEST 1 VIEW COMPARISON:  07/31/2020 FINDINGS: Cardiac shadow is prominent but stable. Aortic calcifications are again identified. Bilateral pleural effusions are again identified relatively stable from the prior exam. Increasing vascular congestion and parenchymal edema is seen as well. No bony abnormality is noted. IMPRESSION: Changes consistent with increasing CHF and small bilateral pleural effusions. Electronically Signed   By: Inez Catalina M.D.   On: 08/11/2020 05:11    Scheduled Meds: . amiodarone  100 mg Oral Daily  . apixaban  2.5 mg Oral BID  . Chlorhexidine Gluconate Cloth  6 each Topical Daily  . furosemide  40 mg Intravenous BID  . insulin aspart  0-6 Units Subcutaneous Q4H  . magnesium oxide  400 mg Oral Daily  . melatonin  6 mg Oral QHS  . sodium chloride flush  3 mL Intravenous Q12H  . zinc oxide  1 application Topical BID   Continuous Infusions: . sodium chloride       LOS: 1 day   Time spent: 39 minutes   Darliss Cheney, MD [facility]  08/12/2020, 10:36 AM   To contact the attending provider between 7A-7P or the covering provider during after hours 7P-7A, please log into the web site [email or web address].

## 2020-08-12 NOTE — Evaluation (Signed)
Occupational Therapy Evaluation Patient Details Name: Kristin Adkins MRN: [id number] DOB: Nov 05, 1920 Today's Date: 08/12/2020    History of Present Illness 87 y/o female presented to ED on 3/4 for SOB. Recently hospitalized 2/20-2/23 with respiratory failure with hypoxia 2/2 to combined CHF exacerbation with suspected pneumonia and UTI. PMH: HTN, CHF, Afib, CKD stage IV, COVID-19 pneumonia on 1/16.   Clinical Impression   PTA, pt from [facility] and receives assist for ADLs/mobility using RW or wheelchair though pt unsure of what level of assist she receives due to memory deficits. On entry, pt attempting to exit bed and requesting bathroom assistance. Pt overall Min A for pivot to/from [facility] with RW, requiring frequent cues for safety strategies due to impulsivity. Pt overall Setup for UB ADLs and up to Max A for LB ADLs, including toileting task today. Pt noted with 3/4 DOE though SpO2 88% and above on 2 L O2. Pt would benefit from reinforcement of energy conservation strategies, safety with DME use during ADLs/mobility in next sessions. Recommend DC back to SNF.     Follow Up Recommendations  SNF    Equipment Recommendations   (defer to next venue)    Recommendations for Other Services       Precautions / Restrictions Precautions Precautions: Fall;Other (comment) Precaution Comments: monitor O2 Restrictions Weight Bearing Restrictions: No      Mobility Bed Mobility Overal bed mobility: Needs Assistance Bed Mobility: Supine to Sit;Sit to Supine     Supine to sit: Supervision;HOB elevated Sit to supine: Supervision   General bed mobility comments: Supervision for safety due to no recognition of line safety    Transfers Overall transfer level: Needs assistance Equipment used: Rolling walker (2 wheeled) Transfers: Sit to/from [facility] Sit to Stand: Min guard Stand pivot transfers: Min assist       General transfer comment: MIn A for safety and  maintaining balance with pivot to/from [facility] with RW    Balance Overall balance assessment: Needs assistance Sitting-balance support: Feet supported;No upper extremity supported Sitting balance-Leahy Scale: Fair     Standing balance support: Bilateral upper extremity supported;Single extremity supported;During functional activity Standing balance-Leahy Scale: Poor Standing balance comment: requires at least single UE support                           ADL either performed or assessed with clinical judgement   ADL Overall ADL's : Needs assistance/impaired Eating/Feeding: Independent;Sitting   Grooming: Set up;Sitting   Upper Body Bathing: Set up;Sitting   Lower Body Bathing: Moderate assistance;Sit to/from stand   Upper Body Dressing : Set up;Sitting   Lower Body Dressing: Moderate assistance;Sit to/from stand Lower Body Dressing Details (indicate cue type and reason): Assist to don socks due to difficulty reaching, likely able to assist with LB dressing above waist in standing Toilet Transfer: Minimal assistance;Stand-pivot;BSC;RW Toilet Transfer Details (indicate cue type and reason): MIn A for maintaining balance and frequent cues for safety Toileting- Clothing Manipulation and Hygiene: Maximal assistance;Sit to/from stand Toileting - Clothing Manipulation Details (indicate cue type and reason): Max A for clothing mgmt and peri care, cues for safety       General ADL Comments: Limited by poor safety awareness, cognitive deficits at baseline. Decreased cardiopulmonary status with 3/4 DOE noted after toileting task today. SpO2 88% and above on 2 L O2     Vision Baseline Vision/History: Wears glasses Wears Glasses: At all times Patient Visual Report: No change from baseline  Vision Assessment?: No apparent visual deficits     Perception     Praxis      Pertinent Vitals/Pain Pain Assessment: No/denies pain     Hand Dominance Right   Extremity/Trunk  Assessment Upper Extremity Assessment Upper Extremity Assessment: Overall WFL for tasks assessed   Lower Extremity Assessment Lower Extremity Assessment: Defer to PT evaluation   Cervical / Trunk Assessment Cervical / Trunk Assessment: Kyphotic   Communication Communication Communication: HOH   Cognition Arousal/Alertness: Awake/alert Behavior During Therapy: WFL for tasks assessed/performed;Impulsive Overall Cognitive Status: History of cognitive impairments - at baseline                                 General Comments: hx of dementia, poor memory and unable to recall PLOF. Pt able to make needs known, follows directions with repetition. requires frequent cues for safety due to impulsivity, poor mgmt of lines, hand placement on DME, etc   General Comments       Exercises     Shoulder Instructions      Home Living Family/patient expects to be discharged to:: Skilled nursing facility                                 Additional Comments: from [facility]      Prior Functioning/Environment Level of Independence: Needs assistance  Gait / Transfers Assistance Needed: reports using RW and wheelchair for mobility depending on how she feels ADL's / Homemaking Assistance Needed: Reports assist for dressing, toileting and bathing but unsure of how much.   Comments: Pt reports she uses oxygen but unsure of how much at baseline        OT Problem List: Decreased activity tolerance;Impaired balance (sitting and/or standing);Decreased cognition;Decreased safety awareness;Decreased knowledge of use of DME or AE;Cardiopulmonary status limiting activity      OT Treatment/Interventions: Self-care/ADL training;Therapeutic exercise;Energy conservation;DME and/or AE instruction;Therapeutic activities;Patient/family education;Balance training    OT Goals(Current goals can be found in the care plan section) Acute Rehab OT Goals Patient Stated Goal: use the  bathroom OT Goal Formulation: With patient Time For Goal Achievement: 08/26/20 Potential to Achieve Goals: Good ADL Goals Pt Will Perform Grooming: with supervision;standing Pt Will Perform Lower Body Dressing: with supervision;sitting/lateral leans;sit to/from stand Pt Will Transfer to Toilet: with supervision;ambulating Pt Will Perform Toileting - Clothing Manipulation and hygiene: with min assist;sit to/from stand;sitting/lateral leans Additional ADL Goal #1: Pt to demonstrate pursed lip breathing during activities with min verbal cues when feeling SOB to maintain SpO2 >90%  OT Frequency: Min 2X/week   Barriers to D/C:            Co-evaluation              AM-PAC OT "6 Clicks" Daily Activity     Outcome Measure Help from another person eating meals?: None Help from another person taking care of personal grooming?: A Little Help from another person toileting, which includes using toliet, bedpan, or urinal?: A Lot Help from another person bathing (including washing, rinsing, drying)?: A Lot Help from another person to put on and taking off regular upper body clothing?: A Little Help from another person to put on and taking off regular lower body clothing?: A Lot 6 Click Score: 16   End of Session Equipment Utilized During Treatment: Rolling walker;Oxygen Nurse Communication: Mobility status  Activity Tolerance: Patient  tolerated treatment well Patient left: in bed;with call bell/phone within reach;with bed alarm set  OT Visit Diagnosis: Muscle weakness (generalized) (M62.81);Other symptoms and signs involving cognitive function;Unsteadiness on feet (R26.81);Other abnormalities of gait and mobility (R26.89)                Time: [id number] OT Time Calculation (min): 17 min Charges:  OT General Charges $OT Visit: 1 Visit OT Evaluation $OT Eval Moderate Complexity: 1 Mod  Malachy Chamber, OTR/L Acute Rehab Services Office: [phone]  Layla Maw 08/12/2020, 9:58 AM

## 2020-08-12 NOTE — Consult Note (Signed)
Consultation Note Date: 08/12/2020   Patient Name: Kristin Adkins  DOB: [birth year]  MRN: [id number]  Age / Sex: 87 y.o., female  PCP: Jene Every, MD Referring Physician: Darliss Cheney, MD  Reason for Consultation: Establishing goals of care  HPI/Patient Profile: 87 y.o. female  with past medical history of hypertension, combined systolic and diastolic CHF(last EF [id number] grade 2 diastolic dysfunction),atrial fibrillation on anticoagulation, chronic kidney disease stage IV with solitary kidney, and COVID-19 pneumonia 06/25/2020  admitted on 08/11/2020 with shortness of breath. Diagnosed with acute respiratory failure with hypoxia and hypercapnia secondary to combined systolic and diastolic CHF exacerbation. Also with AKI on CKD and solitary kidney - possibly cardiorenal syndrome. Patient also with elevated troponin and EKG changes - cardiology aware and patient not a candidate for any intervention - hospice recommended. PMT consulted to discuss [place].  Clinical Assessment and Goals of Care: I have reviewed medical records including EPIC notes, labs and imaging, received report from RN, assessed the patient and then spoke with her son [name] to discuss diagnosis prognosis, [place], EOL wishes, disposition and options.  I introduced Palliative Medicine as specialized medical care for people living with serious illness. It focuses on providing relief from the symptoms and stress of a serious illness. The goal is to improve quality of life for both the patient and the family.  [name] tells me about patient's decline since she had significant gallbladder surgery in 2019 and then a fall with fractured wrist this past summer. He tells me about placement in ALF this past December. We review her multiple hospitalizations recently - 5 in the past few months. He tells me after each hospitalization he feels patient recovers well - is ambulatory, eats well, no weight  loss. He tells me of patient's memory issues, always knows family. He tells me of increased fatigue recently.    We discussed patient's current illness and what it means in the larger context of patient's on-going co-morbidities.  Natural disease trajectory and expectations at EOL were discussed. We discuss her heart failure, elevated troponin with no options for intervention, and her kidney injury. We discuss multiorgan failure and how they all affect each other. [name] expresses understanding but tells me these are chronic issues that she has been dealing with and she recovers well each time. I share with him my concern that this will be ongoing - these are issues we can try to manage but we cannot fix them. We discuss that is we continue on an "aggressive care path" [name] will continue to require hospitalizations but that her conditions are worsening and she is nearing the end of her life despite medical intervention.  We discuss an alternative to aggressive medical care. We discuss focusing on her comfort and quality of life, involving hospice to help support her (possibly at an ALF). We discuss avoiding repeated hospitalizations. I discuss aggressive symptom management with him and how this will be provided outpatient. We discuss that time is limited no matter which path is chosen (aggressive vs comfort).   I attempted to elicit values and goals of care important to the patient.  [name] tells me this is not a topic they have discussed much before so he is unsure what his mother's wishes would be. He wants to try to talk to her about it. We discuss that it will be difficult for [name] to fully understand situation and weigh each option. We discuss using the information he does know about his mom and try to make  decisions for her based on what he thinks would honor her most.   [name] shares with me that the conversation brings up many emotions for him and he needs time to consider his options and  discuss with other family members. He is agreeable to ongoing discussion.   Discussed with [name] the importance of continued conversation with family and the medical providers regarding overall plan of care and treatment options, ensuring decisions are within the context of the patient's values and GOCs.    Questions and concerns were addressed. The family was encouraged to call with questions or concerns.   Primary Decision Maker NEXT OF KIN - son [name]    SUMMARY OF RECOMMENDATIONS    - presented option of comfort focused care, hospice - ongoing goals of care - will ask my colleague Lexine Baton to follow up 3/6  Code Status/Advance Care Planning:  DNR  Discharge Planning: To Be Determined      Primary Diagnoses: Present on Admission: . (Resolved) Acute respiratory failure with hypoxia ([place]) . Acute on chronic respiratory failure with hypoxia and hypercapnia (HCC) . History of COVID-19 . DNR (do not resuscitate) . Acute on chronic combined systolic and diastolic CHF (congestive heart failure) ([place]) . SIRS (systemic inflammatory response syndrome) (HCC) . Acute kidney injury superimposed on chronic kidney disease ([place]) . AF (paroxysmal atrial fibrillation) ([place])   I have reviewed the medical record, interviewed the patient and family, and examined the patient. The following aspects are pertinent.  Past Medical History:  Diagnosis Date  . AF (atrial fibrillation) ([place])   . CHF (congestive heart failure) ([place])   . CKD (chronic kidney disease), stage IV ([place])   . Coronary artery disease   . Hypertension   . Myocardial infarction ([place])   . Respiratory failure ([place])    Social History   Socioeconomic History  . Marital status: Married    Spouse name: Not on file  . Number of children: Not on file  . Years of education: Not on file  . Highest education level: Not on file  Occupational History  . Not on file  Tobacco Use  . Smoking status: Never Smoker  . Smokeless  tobacco: Never Used  Substance and Sexual Activity  . Alcohol use: Never  . Drug use: Not on file  . Sexual activity: Not on file  Other Topics Concern  . Not on file  Social History Narrative  . Not on file   Social Determinants of Health   Financial Resource Strain: Not on file  Food Insecurity: Not on file  Transportation Needs: Not on file  Physical Activity: Not on file  Stress: Not on file  Social Connections: Not on file   History reviewed. No pertinent family history. Scheduled Meds: . amiodarone  100 mg Oral Daily  . apixaban  2.5 mg Oral BID  . Chlorhexidine Gluconate Cloth  6 each Topical Daily  . furosemide  40 mg Intravenous BID  . insulin aspart  0-6 Units Subcutaneous Q4H  . magnesium oxide  400 mg Oral Daily  . melatonin  6 mg Oral QHS  . sodium chloride flush  3 mL Intravenous Q12H  . zinc oxide  1 application Topical BID   Continuous Infusions: . sodium chloride     PRN Meds:.sodium chloride, acetaminophen, LORazepam, sodium chloride flush Allergies  Allergen Reactions  . Codeine     Other reaction(s): UNKNOWN  . Nitrofurantoin     Other reaction(s): UNKNOWN  . Penicillins Rash   Review  of Systems  Unable to perform ROS: Dementia    Physical Exam Cardiovascular:     Rate and Rhythm: Normal rate.  Pulmonary:     Comments: Appears short of breath, reports she does not feel short of breath but unreliable Skin:    General: Skin is warm and dry.  Neurological:     Mental Status: She is alert. She is disoriented.     Vital Signs: BP (!) (P) 120/47 (BP Location: Left Arm)   Pulse 76   Temp (P) 98.6 F (37 C) (Oral)   Resp 20   Wt 71.4 kg   SpO2 94%   BMI 28.79 kg/m  Pain Scale: 0-10   Pain Score: 0-No pain   SpO2: SpO2: 94 % O2 Device:SpO2: 94 % O2 Flow Rate: .O2 Flow Rate (L/min): 2 L/min  IO: Intake/output summary:   Intake/Output Summary (Last 24 hours) at 08/12/2020 1503 Last data filed at 08/12/2020 0800 Gross per 24 hour   Intake -  Output 250 ml  Net -250 ml    LBM: Last BM Date: 08/12/20 Baseline Weight: Weight: 70 kg Most recent weight: Weight: 71.4 kg     Palliative Assessment/Data: PPS 40%    Time Total: 80 minutes Greater than 50%  of this time was spent counseling and coordinating care related to the above assessment and plan.  Juel Burrow, DNP, AGNP-C Palliative Medicine Team [phone] Pager: [phone]

## 2020-08-12 NOTE — Progress Notes (Signed)
PT Cancellation Note  Patient Details Name: Kristin Adkins MRN: [id number] DOB: [birth year]   Cancelled Treatment:    Reason Eval/Treat Not Completed: Medical issues which prohibited therapy Per RN, patient has been having ST elevation and widening QRS. Waiting for test results and RN requested to hold on therapy. PT will re-attempt as time allows.   Wyolene Weimann A. Gilford Rile PT, DPT Acute Rehabilitation Services Pager [phone] Office [phone]    Linna Hoff 08/12/2020, 12:53 PM

## 2020-08-13 ENCOUNTER — Inpatient Hospital Stay (HOSPITAL_COMMUNITY): Payer: Medicare Other

## 2020-08-13 DIAGNOSIS — R609 Edema, unspecified: Secondary | ICD-10-CM

## 2020-08-13 LAB — GLUCOSE, CAPILLARY
Glucose-Capillary: 105 mg/dL — ABNORMAL HIGH (ref 70–99)
Glucose-Capillary: 108 mg/dL — ABNORMAL HIGH (ref 70–99)
Glucose-Capillary: 111 mg/dL — ABNORMAL HIGH (ref 70–99)
Glucose-Capillary: 163 mg/dL — ABNORMAL HIGH (ref 70–99)
Glucose-Capillary: 95 mg/dL (ref 70–99)
Glucose-Capillary: 98 mg/dL (ref 70–99)
Glucose-Capillary: 99 mg/dL (ref 70–99)

## 2020-08-13 LAB — BASIC METABOLIC PANEL
Anion gap: 9 (ref 5–15)
BUN: 28 mg/dL — ABNORMAL HIGH (ref 8–23)
CO2: 27 mmol/L (ref 22–32)
Calcium: 9.7 mg/dL (ref 8.9–10.3)
Chloride: 99 mmol/L (ref 98–111)
Creatinine, Ser: 2.24 mg/dL — ABNORMAL HIGH (ref 0.44–1.00)
GFR, Estimated: 19 mL/min — ABNORMAL LOW (ref >60)
Glucose, Bld: 98 mg/dL (ref 70–99)
Potassium: 4.9 mmol/L (ref 3.5–5.1)
Sodium: 135 mmol/L (ref 135–145)

## 2020-08-13 LAB — MAGNESIUM: Magnesium: 2.2 mg/dL (ref 1.7–2.4)

## 2020-08-13 NOTE — Progress Notes (Signed)
PT Cancellation Note  Patient Details Name: Kristin Adkins MRN: [id number] DOB: Dec 24, 1920   Cancelled Treatment:    Reason Eval/Treat Not Completed: Patient at procedure or test/unavailable. RN requesting PT return later as pt is about to leave for vascular procedure. Will follow-up as able.  Moishe Spice, PT, DPT Acute Rehabilitation Services  Pager: [phone] Office: [place] 08/13/2020, 11:29 AM

## 2020-08-13 NOTE — CV Procedure (Signed)
BLE venous duplex completed.  Results can be found under chart review under CV PROC. 08/13/2020 12:13 PM Kiandre Spagnolo RVT, RDMS

## 2020-08-13 NOTE — Evaluation (Signed)
Physical Therapy Evaluation Patient Details Name: Kristin Adkins MRN: [id number] DOB: [birth year] Today's Date: 08/13/2020   History of Present Illness  87 y/o female presented to ED on 3/4 for SOB. Recently hospitalized 2/20-2/23 with respiratory failure with hypoxia 2/2 to combined CHF exacerbation with suspected pneumonia and UTI. PMH: HTN, CHF, Afib, CKD stage IV, COVID-19 pneumonia on 1/16.  Clinical Impression  Pt presents with condition above and deficits mentioned below, see PT Problem List. PTA, pt from [facility] and receives assist for ADLs/mobility using RW or wheelchair though pt unsure of what level of assist she receives due to memory deficits. She does report she is having an increased difficulty ambulating this date with increased fatigue compared to her normal. Pt demonstrates gross bil lower extremity weakness and incoordination along with impaired judgement in regards to safety, impulsivity, and management of a RW that places the pt at high risk for falls. Will continue to follow acutely. Recommending return to SNF upon d/c to further address her deficits.    Follow Up Recommendations SNF;Supervision/Assistance - 24 hour    Equipment Recommendations  None recommended by PT    Recommendations for Other Services       Precautions / Restrictions Precautions Precautions: Fall;Other (comment) Precaution Comments: monitor O2; HOH Restrictions Weight Bearing Restrictions: No      Mobility  Bed Mobility Overal bed mobility: Needs Assistance Bed Mobility: Supine to Sit     Supine to sit: Supervision;HOB elevated     General bed mobility comments: Pt impulsively trying to come to sit prior to PT being prepared by managing lines, dropping bed rail, etc. Supervision for safety with HOB elevated and use of rails.    Transfers Overall transfer level: Needs assistance Equipment used: Rolling walker (2 wheeled) Transfers: Sit to/from [facility] Sit to  Stand: Min guard Stand pivot transfers: Min assist       General transfer comment: Min guard with power up to stand. MinA for stand pivot recliner <> commode and EOB > recliner due to poor line and RW management and impulsively sitting prior to squaring up with seat.  Ambulation/Gait Ambulation/Gait assistance: Min assist Gait Distance (Feet): 15 Feet (x5 bouts ~3 ft > ~15 ft > ~15 ft > ~3 ft > ~3 ft) Assistive device: Rolling walker (2 wheeled) Gait Pattern/deviations: Step-through pattern;Decreased step length - right;Decreased step length - left;Shuffle;Trunk flexed Gait velocity: slowed Gait velocity interpretation: <1.31 ft/sec, indicative of household ambulator General Gait Details: contact guard assist for ambulation in room with RW, unsteady but no LOB. Pt with poor RW management, pushing it distally to her, especially when turning, needing cues to keep it proximal to maintain safety. Pt easily fatigues.  Stairs            Wheelchair Mobility    Modified Rankin (Stroke Patients Only)       Balance Overall balance assessment: Needs assistance Sitting-balance support: Feet supported;No upper extremity supported Sitting balance-Leahy Scale: Fair Sitting balance - Comments: Static sitting EOB, supervision for safety.   Standing balance support: Bilateral upper extremity supported;During functional activity Standing balance-Leahy Scale: Poor Standing balance comment: Reliant on UE support on RW.                             Pertinent Vitals/Pain Pain Assessment: Faces Faces Pain Scale: Hurts a little bit Pain Location: legs Pain Descriptors / Indicators: Discomfort;Grimacing;Moaning Pain Intervention(s): Limited activity within patient's tolerance;Monitored during session;Repositioned  Home Living Family/patient expects to be discharged to:: Skilled nursing facility                 Additional Comments: from [facility]    Prior Function Level  of [place]: Needs assistance   Gait / Transfers Assistance Needed: reports using RW and wheelchair for mobility depending on how she feels  ADL's / Homemaking Assistance Needed: Reports assist for dressing, toileting and bathing but unsure of how much.  Comments: Pt reports she uses oxygen but unsure of how much at baseline     Hand Dominance   Dominant Hand: Right    Extremity/Trunk Assessment   Upper Extremity Assessment Upper Extremity Assessment: Defer to OT evaluation    Lower Extremity Assessment Lower Extremity Assessment: Generalized weakness (grossly 4- to 4 based on functional mob observation, gross incoordination, denies numbness/tingling)    Cervical / Trunk Assessment Cervical / Trunk Assessment: Kyphotic  Communication   Communication: HOH  Cognition Arousal/Alertness: Awake/alert Behavior During Therapy: WFL for tasks assessed/performed;Impulsive Overall Cognitive Status: History of cognitive impairments - at baseline                                 General Comments: hx of dementia, poor memory. Pt able to make needs known, follows directions with repetition. requires frequent cues for safety due to impulsivity and poor mgmt of lines and DME, etc.      General Comments General comments (skin integrity, edema, etc.): Pt on 3L/min O2 via [place]    Exercises     Assessment/Plan    PT Assessment Patient needs continued PT services  PT Problem List Decreased strength;Decreased activity tolerance;Decreased balance;Decreased mobility;Decreased coordination;Decreased cognition;Decreased safety awareness;Cardiopulmonary status limiting activity;Decreased range of motion;Decreased knowledge of use of DME       PT Treatment Interventions DME instruction;Gait training;Functional mobility training;Therapeutic activities;Therapeutic exercise;Balance training;Cognitive remediation;Patient/family education;Neuromuscular re-education    PT Goals (Current  goals can be found in the Care Plan section)  Acute Rehab PT Goals Patient Stated Goal: use the bathroom and get out of bed PT Goal Formulation: Patient unable to participate in goal setting Time For Goal Achievement: 08/27/20 Potential to Achieve Goals: Fair    Frequency Min 2X/week   Barriers to discharge        Co-evaluation               AM-PAC PT "6 Clicks" Mobility  Outcome Measure Help needed turning from your back to your side while in a flat bed without using bedrails?: A Little Help needed moving from lying on your back to sitting on the side of a flat bed without using bedrails?: A Little Help needed moving to and from a bed to a chair (including a wheelchair)?: A Little Help needed standing up from a chair using your arms (e.g., wheelchair or bedside chair)?: A Little Help needed to walk in hospital room?: A Little Help needed climbing 3-5 steps with a railing? : A Lot 6 Click Score: 17    End of Session Equipment Utilized During Treatment: Gait belt;Oxygen Activity Tolerance: Patient limited by fatigue Patient left: in chair;with call bell/phone within reach;with chair alarm set Nurse Communication: Mobility status PT Visit Diagnosis: Unsteadiness on feet (R26.81);Other abnormalities of gait and mobility (R26.89);History of falling (Z91.81);Muscle weakness (generalized) (M62.81);Difficulty in walking, not elsewhere classified (R26.2)    Time: [id number] PT Time Calculation (min) (ACUTE ONLY): 26 min   Charges:  PT Evaluation $PT Eval Moderate Complexity: 1 Mod PT Treatments $Therapeutic Activity: 8-22 mins        Moishe Spice, PT, DPT Acute Rehabilitation Services  Pager: [phone] Office: [place] 08/13/2020, 3:24 PM

## 2020-08-13 NOTE — Progress Notes (Addendum)
PROGRESS NOTE    Kristin Adkins  [id number] DOB: 1920-11-15 DOA: 08/11/2020 PCP: Jene Every, MD   Brief Narrative:  HPI: Kristin Adkins is a 87 y.o. female with medical history significant of hypertension, combined systolic and diastolic CHF(last EF [id number] with grade 2 diastolic dysfunction), atrial fibrillation on anticoagulation, chronic kidney disease stage IV with solitary kidney, and COVID-19 pneumonia 06/25/2020 who presents with shortness of breath.  History is limited from the patient at this time and son reports that she has some issues with her memory.  Her son had seen her yesterday and noted that she seemed to be doing well and states her weight has been staying around 156 lbs.  However, yesterday evening she was noted to be more short of breath.  Patient denies any fever, chest pain, or cough.  Patient had just been recently hospitalized from 2/20-2/23 with respiratory failure with hypoxia secondary to combined congestive heart failure exacerbation with suspected pneumonia and UTI.  She was treated with antibiotics for underlying possible infections.  She had also been switched from furosemide 40 mg daily to torsemide 20 mg daily by cardiology.  At discharge she was back on room air.  Patient has had several admissions into the hospital recently over the last few months.  Found by EMS with O2 saturations in the 70s and was placed on CPAP.  ED Course: Upon admission to the emergency department patient was seen to be afebrile, pulse 85-1 21, respiration 25-38, blood pressure 98/41-144/63, and O2 saturations currently maintained around 97% on BiPAP.  Labs significant for WBC 13.3, BUN 24, creatinine 2.3, glucose 285, albumin 3.2, AST 43, BNP 1300.9, and troponin 24.  pH 7.204, pCO2Chest x-ray consistent for increasing CHF with small bilateral pleural effusions.  Patient has been given furosemide 40 mg IV.  TRH called to admit.  Assessment & Plan:   Principal Problem:   Acute on  chronic combined systolic and diastolic CHF (congestive heart failure) (HCC) Active Problems:   DNR (do not resuscitate)   Acute on chronic respiratory failure with hypoxia and hypercapnia (HCC)   AF (paroxysmal atrial fibrillation) (HCC)   Chronic anticoagulation   History of COVID-19   SIRS (systemic inflammatory response syndrome) (HCC)   Acute kidney injury superimposed on chronic kidney disease (HCC)   Pressure injury of skin   Acute respiratory failure with hypoxia and hypercapnia secondary to combined systolic and diastolic CHF exacerbation: Patient presents with respiratory distress with O2 saturations noted to be in the 70s.  She was placed on BiPAP.  ABG was significant for pH 7.204, PCO2 61.2, and PO2 88 concerning for a respiratory acidosis.  Chest x-ray significant for increasing CHF with small bilateral pleural effusions.  BNP 1300.9 which is increased from previous admission.  During discharge she had been started on torosemide 20 mg daily by cardiology.  Last echocardiogram on 07/18/2020 noted EF of 30- 35% which was decreased from previous of >55% in 05/2020.  Cardiology saw patient.  They started her on Lasix twice daily.  She feels  better.  She does not have any rhonchi anymore but still on oxygen.  We will continue Lasix for 1 more day.  Advised primary RN to wean her to room air.  Non-ST elevation MI: Patient had an episode of chest pain yesterday, troponin  SIRS: Patient was noted to be tachycardic and tachypneic with WBC elevated at 13.3.  At the time of admission.  All these parameters have normalized now.  No suspicion of infection.  Acute kidney injury superimposed on chronic kidney disease stage IV: Patient with solitary kidney.  Baseline creatinine about 1.9.  Presented with 2.3.  No change from yesterday.  Could be cardiorenal syndrome.  Monitor.  Elevated troponin/EKG changes/non-ST elevation MI: Denies any chest pain or shortness of breath.  On the morning of  08/12/2020 telemetry called for some ST changes on the leads.  EKG was done.  This shows ST elevation on V1 through V3 with reciprocal ST depression from V4 to V6.  This was brought up to my attention of cardiology and due to her advanced age, they recommended continue current medical management.  Transthoracic echo shows 30 to 35% ejection fraction, no change from previously.  Global hypokinesis.  Patient is asymptomatic today.  Diabetes mellitus type 2 with hyperglycemia: Acute.  On admission glucose elevated up to 285.  Last hemoglobin A1c was 5.2 on 07/30/2020.  Suspect elevated glucose secondary to acute stress response.  Currently blood sugar within normal range.  Continue SSI.  Essential hypertension: Blood pressures initially noted to be soft.  Home blood pressure medications include torsemide 20 mg daily and hydralazine 10 mg every 8 hours.  Blood pressure still slightly soft.  Continue to hold antihypertensives.  Paroxysmal atrial fibrillation on chronic anticoagulation: Currently in sinus rhythm.  Continue Eliquis and amiodarone.   Prolonged QT interval: Acute on chronic.  On admission QTC 550.  Keep electrolytes within normal range. -Holding additional QT prolonging medications.  Repeat EKG tomorrow morning.  History of COVID-19 infection: Patient had pneumonia due to COVID-19 on 1/16.  DNR: Present on admission.  Frequent hospitalizations: Palliative care on board.  They had long discussion with the son.  DVT prophylaxis: apixaban (ELIQUIS) tablet 2.5 mg Start: 08/11/20 1000   Code Status: DNR  Family Communication: None present at bedside.   Status is: Inpatient  Remains inpatient appropriate because:Inpatient level of care appropriate due to severity of illness   Dispo: The patient is from: SNF              Anticipated d/c is to: SNF              Patient currently is not medically stable to d/c.   Difficult to place patient No        Estimated body mass index is  27.98 kg/m as calculated from the following:   Height as of 07/30/20: '5\' 2"'$  (1.575 m).   Weight as of this encounter: 69.4 kg.  Pressure Injury 08/11/20 Heel Right Stage 1 -  Intact skin with non-blanchable redness of a localized area usually over a bony prominence. nonblanchable area to right heel (Active)  08/11/20 1557  Location: Heel  Location Orientation: Right  Staging: Stage 1 -  Intact skin with non-blanchable redness of a localized area usually over a bony prominence.  Wound Description (Comments): nonblanchable area to right heel  Present on Admission: Yes     Nutritional status:               Consultants:   Cardiology  Palliative medicine  Procedures:   None  Antimicrobials:  Anti-infectives (From admission, onward)   None         Subjective: Seen and examined.  Sitting in the chair.  Feeling comfortable.  Denies any shortness of breath or chest pain.  Fully alert and oriented.  Objective: Vitals:   08/12/20 2045 08/13/20 0000 08/13/20 0433 08/13/20 0500  BP: 121/81  97/68   Pulse:      Resp:  Temp: 97.6 F (36.4 C) 98.8 F (37.1 C) 97.6 F (36.4 C)   TempSrc: Oral Oral Oral   SpO2:  96%    Weight:    69.4 kg    Intake/Output Summary (Last 24 hours) at 08/13/2020 1342 Last data filed at 08/13/2020 0900 Gross per 24 hour  Intake --  Output 2550 ml  Net -2550 ml   Filed Weights   08/11/20 1541 08/12/20 0500 08/13/20 0500  Weight: 70 kg 71.4 kg 69.4 kg    Examination:  General exam: Appears calm and comfortable  Respiratory system: Clear to auscultation. Respiratory effort normal. Cardiovascular system: S1 & S2 heard, RRR. No JVD, murmurs, rubs, gallops or clicks. No pedal edema. Gastrointestinal system: Abdomen is nondistended, soft and nontender. No organomegaly or masses felt. Normal bowel sounds heard. Central nervous system: Alert and oriented. No focal neurological deficits. Extremities: Symmetric 5 x 5 power. Skin: No  rashes, lesions or ulcers.  Psychiatry: Judgement and insight appear poor   Data Reviewed: I have personally reviewed following labs and imaging studies  CBC: Recent Labs  Lab 08/11/20 0536 08/11/20 0617 08/12/20 0131 08/12/20 1058  WBC 13.3*  --  5.6 5.3  NEUTROABS 4.9  --  3.7 3.9  HGB 13.2  14.3 14.6 10.9* 11.4*  HCT 44.1  42.0 43.0 35.1* 36.8  MCV 95.5  --  92.1 91.8  PLT 160  --  108* [id number]*   Basic Metabolic Panel: Recent Labs  Lab 08/11/20 0536 08/11/20 0617 08/11/20 1015 08/12/20 0131 08/13/20 0116  NA 136  134* 136  --  136 135  K 4.8  6.3* 5.1  --  4.9 4.9  CL 102  104  --   --  102 99  CO2 19*  --   --  24 27  GLUCOSE 285*  304*  --   --  92 98  BUN 24*  41*  --   --  28* 28*  CREATININE 2.30*  2.10*  --   --  2.34* 2.24*  CALCIUM 10.2  --   --  9.5 9.7  MG  --   --  2.4  --  2.2   GFR: Estimated Creatinine Clearance: 13.1 mL/min (A) (by C-G formula based on SCr of 2.24 mg/dL (H)). Liver Function Tests: Recent Labs  Lab 08/11/20 0536  AST 43*  ALT 26  ALKPHOS 83  BILITOT 1.0  PROT 6.5  ALBUMIN 3.2*   No results for input(s): LIPASE, AMYLASE in the last 168 hours. No results for input(s): AMMONIA in the last 168 hours. Coagulation Profile: No results for input(s): INR, PROTIME in the last 168 hours. Cardiac Enzymes: No results for input(s): CKTOTAL, CKMB, CKMBINDEX, TROPONINI in the last 168 hours. BNP (last 3 results) No results for input(s): PROBNP in the last 8760 hours. HbA1C: No results for input(s): HGBA1C in the last 72 hours. CBG: Recent Labs  Lab 08/12/20 2034 08/13/20 0022 08/13/20 0439 08/13/20 0821 08/13/20 1217  GLUCAP 162* 105* 95 98 99   Lipid Profile: No results for input(s): CHOL, HDL, LDLCALC, TRIG, CHOLHDL, LDLDIRECT in the last 72 hours. Thyroid Function Tests: No results for input(s): TSH, T4TOTAL, FREET4, T3FREE, THYROIDAB in the last 72 hours. Anemia Panel: No results for input(s): VITAMINB12, FOLATE,  FERRITIN, TIBC, IRON, RETICCTPCT in the last 72 hours. Sepsis Labs: Recent Labs  Lab 08/11/20 1534 08/12/20 0830 08/12/20 1037  LATICACIDVEN 2.3* 1.0 1.6    Recent Results (from the past 240 hour(s))  Resp Panel  by RT-PCR (Flu A&B, Covid) Nasopharyngeal Swab     Status: None   Collection Time: 08/11/20  4:42 AM   Specimen: Nasopharyngeal Swab; Nasopharyngeal(NP) swabs in vial transport medium  Result Value Ref Range Status   SARS Coronavirus 2 by RT PCR NEGATIVE NEGATIVE Final    Comment: (NOTE) SARS-CoV-2 target nucleic acids are NOT DETECTED.  The SARS-CoV-2 RNA is generally detectable in upper respiratory specimens during the acute phase of infection. The lowest concentration of SARS-CoV-2 viral copies this assay can detect is 138 copies/mL. A negative result does not preclude SARS-Cov-2 infection and should not be used as the sole basis for treatment or other patient management decisions. A negative result may occur with  improper specimen collection/handling, submission of specimen other than nasopharyngeal swab, presence of viral mutation(s) within the areas targeted by this assay, and inadequate number of viral copies(<138 copies/mL). A negative result must be combined with clinical observations, patient history, and epidemiological information. The expected result is Negative.  Fact Sheet for Patients:  EntrepreneurPulse.com.au  Fact Sheet for Healthcare Providers:  IncredibleEmployment.be  This test is no t yet approved or cleared by the [place] FDA and  has been authorized for detection and/or diagnosis of SARS-CoV-2 by FDA under an Emergency Use Authorization (EUA). This EUA will remain  in effect (meaning this test can be used) for the duration of the COVID-19 declaration under Section 564(b)(1) of the Act, 21 U.S.C.section 360bbb-3(b)(1), unless the authorization is terminated  or revoked sooner.       Influenza A  by PCR NEGATIVE NEGATIVE Final   Influenza B by PCR NEGATIVE NEGATIVE Final    Comment: (NOTE) The Xpert Xpress SARS-CoV-2/FLU/RSV plus assay is intended as an aid in the diagnosis of influenza from Nasopharyngeal swab specimens and should not be used as a sole basis for treatment. Nasal washings and aspirates are unacceptable for Xpert Xpress SARS-CoV-2/FLU/RSV testing.  Fact Sheet for Patients: EntrepreneurPulse.com.au  Fact Sheet for Healthcare Providers: IncredibleEmployment.be  This test is not yet approved or cleared by the [place] FDA and has been authorized for detection and/or diagnosis of SARS-CoV-2 by FDA under an Emergency Use Authorization (EUA). This EUA will remain in effect (meaning this test can be used) for the duration of the COVID-19 declaration under Section 564(b)(1) of the Act, 21 U.S.C. section 360bbb-3(b)(1), unless the authorization is terminated or revoked.  Performed at [facility], [place] [address], [place], [place] [address]   Culture, blood (routine x 2)     Status: None (Preliminary result)   Collection Time: 08/11/20 10:15 AM   Specimen: BLOOD LEFT HAND  Result Value Ref Range Status   Specimen Description BLOOD LEFT HAND  Final   Special Requests   Final    BOTTLES DRAWN AEROBIC AND ANAEROBIC Blood Culture results may not be optimal due to an inadequate volume of blood received in culture bottles   Culture   Final    NO GROWTH 2 DAYS Performed at [facility], [place] [address] [place], [place] [address]    Report Status PENDING  Incomplete  Culture, blood (routine x 2)     Status: None (Preliminary result)   Collection Time: 08/11/20  3:34 PM   Specimen: BLOOD RIGHT HAND  Result Value Ref Range Status   Specimen Description BLOOD RIGHT HAND  Final   Special Requests   Final    BOTTLES DRAWN AEROBIC ONLY Blood Culture results may not be optimal due to an inadequate  volume of blood received  in culture bottles   Culture   Final    NO GROWTH 2 DAYS Performed at [facility], [place] [address] [place], [place] [address]    Report Status PENDING  Incomplete      Radiology Studies: ECHOCARDIOGRAM COMPLETE  Result Date: 08/12/2020    ECHOCARDIOGRAM REPORT   Patient Name:   TENISHA PAGLIA Date of Exam: 08/12/2020 Medical Rec #:  [id number]      Height:       62.0 in Accession #:    [id number]     Weight:       157.4 lb Date of Birth:  04-Jun-1921     BSA:          1.727 m Patient Age:    89 years       BP:           126/56 mmHg Patient Gender: F              HR:           75 bpm. Exam Location:  Inpatient Procedure: 2D Echo, Cardiac Doppler and Color Doppler Indications:    CHF, CAD  History:        Patient has prior history of Echocardiogram examinations, most                 recent 07/18/2020. at [facility] CHF; CAD.  Sonographer:    Merrie Roof RDCS Referring Phys: [id number] [place]  1. Left ventricular ejection fraction, by estimation, is 30 to 35%. The left ventricle has moderately decreased function. The left ventricle demonstrates global hypokinesis. The left ventricular internal cavity size was mildly dilated. Left ventricular diastolic parameters are consistent with Grade I diastolic dysfunction (impaired relaxation). Elevated left atrial pressure.  2. Right ventricular systolic function is normal. The right ventricular size is normal. There is moderately elevated pulmonary artery systolic pressure. The estimated right ventricular systolic pressure is 123XX123 mmHg.  3. Left atrial size was mild to moderately dilated.  4. The mitral valve is normal in structure. Mild to moderate mitral valve regurgitation.  5. Tricuspid valve regurgitation is mild to moderate.  6. The aortic valve is tricuspid. Aortic valve regurgitation is mild. Mild aortic valve sclerosis is present, with no evidence of aortic valve stenosis.  7. The inferior vena cava is normal in size with greater than 50%  respiratory variability, suggesting right atrial pressure of 3 mmHg. Comparison(s): Prior images unable to be directly viewed, comparison made by report only. No significant change from prior study. FINDINGS  Left Ventricle: Left ventricular ejection fraction, by estimation, is 30 to 35%. The left ventricle has moderately decreased function. The left ventricle demonstrates global hypokinesis. The left ventricular internal cavity size was mildly dilated. There is no left ventricular hypertrophy. Abnormal (paradoxical) septal motion, consistent with left bundle branch block. Left ventricular diastolic parameters are consistent with Grade I diastolic dysfunction (impaired relaxation). Elevated left atrial pressure. Right Ventricle: The right ventricular size is normal. No increase in right ventricular wall thickness. Right ventricular systolic function is normal. There is moderately elevated pulmonary artery systolic pressure. The tricuspid regurgitant velocity is 3.62 m/s, and with an assumed right atrial pressure of 3 mmHg, the estimated right ventricular systolic pressure is 123XX123 mmHg. Left Atrium: Left atrial size was mild to moderately dilated. Right Atrium: Right atrial size was normal in size. Pericardium: The pericardium was not well visualized. Mitral Valve: The mitral valve is normal in  structure. Mild to moderate mitral valve regurgitation. Tricuspid Valve: The tricuspid valve is normal in structure. Tricuspid valve regurgitation is mild to moderate. Aortic Valve: The aortic valve is tricuspid. Aortic valve regurgitation is mild. Mild aortic valve sclerosis is present, with no evidence of aortic valve stenosis. Aortic valve mean gradient measures 4.0 mmHg. Aortic valve peak gradient measures 8.1 mmHg. Aortic valve area, by VTI measures 1.21 cm. Pulmonic Valve: The pulmonic valve was grossly normal. Pulmonic valve regurgitation is trivial. Aorta: The aortic root is normal in size and structure. Venous: The  inferior vena cava is normal in size with greater than 50% respiratory variability, suggesting right atrial pressure of 3 mmHg. IAS/Shunts: No atrial level shunt detected by color flow Doppler.  LEFT VENTRICLE PLAX 2D LVIDd:         5.70 cm      Diastology LVIDs:         4.70 cm      LV e' medial:    3.26 cm/s LV PW:         1.10 cm      LV E/e' medial:  28.3 LV IVS:        1.10 cm      LV e' lateral:   4.68 cm/s LVOT diam:     2.00 cm      LV E/e' lateral: 19.7 LV SV:         38 LV SV Index:   22 LVOT Area:     3.14 cm                              3D Volume EF: LV Volumes (MOD)            3D EF:        29 % LV vol d, MOD A2C: 116.0 ml LV EDV:       141 ml LV vol d, MOD A4C: 95.0 ml  LV ESV:       101 ml LV vol s, MOD A2C: 69.0 ml  LV SV:        40 ml LV vol s, MOD A4C: 83.6 ml LV SV MOD A2C:     47.0 ml LV SV MOD A4C:     95.0 ml LV SV MOD BP:      30.6 ml RIGHT VENTRICLE          IVC RV Basal diam:  3.70 cm  IVC diam: 1.30 cm LEFT ATRIUM             Index       RIGHT ATRIUM           Index LA diam:        3.80 cm 2.20 cm/m  RA Area:     10.80 cm LA Vol (A2C):   69.7 ml 40.37 ml/m RA Volume:   24.50 ml  14.19 ml/m LA Vol (A4C):   45.3 ml 26.24 ml/m LA Biplane Vol: 56.9 ml 32.95 ml/m  AORTIC VALVE AV Area (Vmax):    1.66 cm AV Area (Vmean):   1.39 cm AV Area (VTI):     1.21 cm AV Vmax:           142.00 cm/s AV Vmean:          97.500 cm/s AV VTI:            0.312 m AV Peak Grad:      8.1 mmHg  AV Mean Grad:      4.0 mmHg LVOT Vmax:         75.10 cm/s LVOT Vmean:        43.200 cm/s LVOT VTI:          0.120 m LVOT/AV VTI ratio: 0.38  AORTA Ao Root diam: 2.80 cm MITRAL VALVE                 TRICUSPID VALVE MV Area (PHT): 4.06 cm      TR Peak grad:   52.4 mmHg MV Decel Time: 187 msec      TR Vmax:        362.00 cm/s MR Peak grad:    82.4 mmHg MR Vmax:         454.00 cm/s SHUNTS MR PISA:         1.01 cm    Systemic VTI:  0.12 m MR PISA Eff ROA: 7 mm       Systemic Diam: 2.00 cm MR PISA Radius:  0.40 cm MV E  velocity: 92.10 cm/s MV A velocity: 118.00 cm/s MV E/A ratio:  0.78 Mihai Croitoru MD Electronically signed by Sanda Klein MD Signature Date/Time: 08/12/2020/5:26:21 PM    Final    VAS [place] LOWER EXTREMITY VENOUS (DVT)  Result Date: 08/13/2020  Lower Venous DVT Study Indications: Edema.  Limitations: Poor ultrasound/tissue interface. Comparison Study: No previous exams Performing Technologist: Rogelia Rohrer  Examination Guidelines: A complete evaluation includes B-mode imaging, spectral Doppler, color Doppler, and power Doppler as needed of all accessible portions of each vessel. Bilateral testing is considered an integral part of a complete examination. Limited examinations for reoccurring indications may be performed as noted. The reflux portion of the exam is performed with the patient in reverse Trendelenburg.  +---------+---------------+---------+-----------+----------+--------------+ RIGHT    CompressibilityPhasicitySpontaneityPropertiesThrombus Aging +---------+---------------+---------+-----------+----------+--------------+ CFV      Full           Yes      Yes                                 +---------+---------------+---------+-----------+----------+--------------+ SFJ      Full                                                        +---------+---------------+---------+-----------+----------+--------------+ FV Prox  Full           Yes      Yes                                 +---------+---------------+---------+-----------+----------+--------------+ FV Mid   Full           Yes      Yes                                 +---------+---------------+---------+-----------+----------+--------------+ FV DistalFull           Yes      Yes                                 +---------+---------------+---------+-----------+----------+--------------+ PFV  Full                                                         +---------+---------------+---------+-----------+----------+--------------+ POP      Full           Yes      Yes                                 +---------+---------------+---------+-----------+----------+--------------+ PTV      Full                                                        +---------+---------------+---------+-----------+----------+--------------+ PERO     Full                                                        +---------+---------------+---------+-----------+----------+--------------+   +---------+---------------+---------+-----------+----------+-------------------+ LEFT     CompressibilityPhasicitySpontaneityPropertiesThrombus Aging      +---------+---------------+---------+-----------+----------+-------------------+ CFV      Full           Yes      Yes                                      +---------+---------------+---------+-----------+----------+-------------------+ SFJ      Full                                                             +---------+---------------+---------+-----------+----------+-------------------+ FV Prox  Full           Yes      Yes                                      +---------+---------------+---------+-----------+----------+-------------------+ FV Mid   Full           Yes      Yes                                      +---------+---------------+---------+-----------+----------+-------------------+ FV DistalFull           Yes      Yes                                      +---------+---------------+---------+-----------+----------+-------------------+ PFV      Full                                                             +---------+---------------+---------+-----------+----------+-------------------+  POP      Full           Yes      Yes                                      +---------+---------------+---------+-----------+----------+-------------------+ PTV      Full                                          Not well visualized +---------+---------------+---------+-----------+----------+-------------------+   Left Technical Findings: Not visualized segments include Peroneal veins.   Summary: BILATERAL: - No evidence of deep vein thrombosis seen in the lower extremities, bilaterally. - No evidence of superficial venous thrombosis in the lower extremities, bilaterally. -No evidence of popliteal cyst, bilaterally.   *See table(s) above for measurements and observations.    Preliminary     Scheduled Meds: . amiodarone  100 mg Oral Daily  . apixaban  2.5 mg Oral BID  . Chlorhexidine Gluconate Cloth  6 each Topical Daily  . furosemide  40 mg Intravenous BID  . insulin aspart  0-6 Units Subcutaneous Q4H  . magnesium oxide  400 mg Oral Daily  . melatonin  6 mg Oral QHS  . sodium chloride flush  3 mL Intravenous Q12H  . zinc oxide  1 application Topical BID   Continuous Infusions: . sodium chloride       LOS: 2 days   Time spent: 30 minutes   Darliss Cheney, MD [facility]  08/13/2020, 1:42 PM   To contact the attending provider between 7A-7P or the covering provider during after hours 7P-7A, please log into the web site [email or web address].

## 2020-08-14 DIAGNOSIS — J810 Acute pulmonary edema: Secondary | ICD-10-CM

## 2020-08-14 LAB — BASIC METABOLIC PANEL
Anion gap: 13 (ref 5–15)
BUN: 31 mg/dL — ABNORMAL HIGH (ref 8–23)
CO2: 26 mmol/L (ref 22–32)
Calcium: 10 mg/dL (ref 8.9–10.3)
Chloride: 96 mmol/L — ABNORMAL LOW (ref 98–111)
Creatinine, Ser: 2.09 mg/dL — ABNORMAL HIGH (ref 0.44–1.00)
GFR, Estimated: 21 mL/min — ABNORMAL LOW (ref >60)
Glucose, Bld: 108 mg/dL — ABNORMAL HIGH (ref 70–99)
Potassium: 4.8 mmol/L (ref 3.5–5.1)
Sodium: 135 mmol/L (ref 135–145)

## 2020-08-14 LAB — GLUCOSE, CAPILLARY
Glucose-Capillary: 111 mg/dL — ABNORMAL HIGH (ref 70–99)
Glucose-Capillary: 103 mg/dL — ABNORMAL HIGH (ref 70–99)
Glucose-Capillary: 114 mg/dL — ABNORMAL HIGH (ref 70–99)
Glucose-Capillary: 121 mg/dL — ABNORMAL HIGH (ref 70–99)
Glucose-Capillary: 135 mg/dL — ABNORMAL HIGH (ref 70–99)

## 2020-08-14 LAB — SARS CORONAVIRUS 2 (TAT 6-24 HRS): SARS Coronavirus 2: NEGATIVE

## 2020-08-14 LAB — LIPID PANEL
Cholesterol: 153 mg/dL (ref 0–200)
HDL: 56 mg/dL (ref >40)
LDL Cholesterol: 77 mg/dL (ref 0–99)
Total CHOL/HDL Ratio: 2.7 RATIO
Triglycerides: 101 mg/dL (ref <150)
VLDL: 20 mg/dL (ref 0–40)

## 2020-08-14 MED ORDER — TORSEMIDE 20 MG PO TABS
20.0000 mg | ORAL_TABLET | Freq: Every day | ORAL | Status: DC
  Administered 2020-08-15: 20 mg via ORAL
  Filled 2020-08-14: qty 1

## 2020-08-14 NOTE — Progress Notes (Signed)
 Daily Progress Note   Patient Name: Kristin Adkins       Date: 08/14/2020 DOB: 04/17/1921  Age: 87 y.o. MRN#: [id number] Attending Physician: Pahwani, Ravi, MD Primary Care Physician: Gibson, David, MD Admit Date: 08/11/2020  Reason for Consultation/Follow-up: Establishing goals of care  Subjective: No acute distress. Denies pain. Alert and oriented x3. Sitting up in recliner reading newspaper. Requesting glasses and hearing aids. Glasses in room assisted with cleaning and putting on. Is asking when she is going to get to leave the hospital.    I met with patient's son at the bedside. Detailed updates provided. Kristin Adkins verbalized understanding. I discussed at length concerns of recurrent admissions and poor prognosis. Son shares he is appreciative of the concerns and the medical team's opinions however, his mother constantly improves after each hospitalization and he is not willing or ready to consider hospice for her.   He expresses he remains hopeful she will continue to remain stable. Detailed discussion regarding her admissions and similar presentation. I explained to Kristin Adkins concerns patient will continue to have re-hospitalizations and encouraged him to focus on her quality of life and overall health. Son reports he is open to as many admissions as possible until he knows for sure she would not bounce back and do as well as she is doing today. He shares her current presentation is similar to all other admissions and what he would expect. I attempted to discuss with son although patient may physically look approved her overall conditions (heart and renal) will not improve.   Education provided on hospice versus palliative. Hard Choices book and printed materials provided to son as requested. Kristin Adkins states "maybe I am selfish but I am not prepared to accept my mother needs hospice!". I encouraged at minimum outpatient palliative support to continue ongoing discussions. Son verbalized  understanding yet declined. He reports he would prefer to read and research Palliative and hospice more before agreeing. Discussed he may initiate services at anytime by discussing with medical team.   Son and patient confirms DNR/DNI. No artificial feedings/PEG. Ok with BiPAP short term.   Kristin Adkins reports patient is originally from ALF in [place] and hopeful she will eventually be able to return. In the meantime he is hopeful she will discharge soon to [facility] facility and continue with rehab.   All questions and support provided.   Length of Stay: 3 days  Vital Signs: BP 107/82 (BP Location: Right Arm)   Pulse 78   Temp 98.3 F (36.8 C) (Oral)   Resp 20   Wt 66.8 kg   SpO2 98%   BMI 26.94 kg/m  SpO2: SpO2: 98 % O2 Device: O2 Device: Nasal Cannula O2 Flow Rate: O2 Flow Rate (L/min): 3 L/min  Physical Exam: Awake, A&O x3, no acute distress 3L/, no shortness of breath, diminished bases             Palliative Care Assessment & Plan  HPI: Per Colleague Shae, NP: 87 y.o. female  with past medical history of hypertension, combined systolic and diastolic CHF(last EF 30-35%with grade 2 diastolic dysfunction),atrial fibrillation on anticoagulation, chronic kidney disease stage IV with solitary kidney, and COVID-19 pneumonia 06/25/2020  admitted on 08/11/2020 with shortness of breath. Diagnosed with acute respiratory failure with hypoxia and hypercapnia secondary to combined systolic and diastolic CHF exacerbation. Also with AKI on CKD and solitary kidney - possibly cardiorenal syndrome. Patient also with elevated troponin and EKG changes - cardiology aware and patient not a candidate   for any intervention - hospice recommended. PMT consulted to discuss [place].  Code Status:  DNR  Goals of Care/Recommendations:  Detailed education and recommendation for outpatient hospice support. Son states he is not accepting the need for hospice or palliative at this time. Is focused on his mother's  improvement despite extensive disease trajectory discussion. Is focused on her improvement/stability during each admission.   Hard Choices book and written material on hospice vs. Palliative provided. Son is aware he may initiate either service at anytime by discussing with medical team.   PMT will continue to support and follow as needed. Please call for urgent needs.   Prognosis: Guarded-POOR   Discharge Planning: To Be Determined  Thank you for allowing the Palliative Medicine Team to assist in the care of this patient.  Time Total: 55 min.   Visit consisted of counseling and education dealing with the complex and emotionally intense issues of symptom management and palliative care in the setting of serious and potentially life-threatening illness.Greater than 50%  of this time was spent counseling and coordinating care related to the above assessment and plan.  Alda Lea, AGPCNP-BC  Palliative Medicine Team [phone]

## 2020-08-14 NOTE — Plan of Care (Signed)

## 2020-08-14 NOTE — Plan of Care (Signed)

## 2020-08-14 NOTE — NC FL2 (Signed)
[place] MEDICAID FL2 LEVEL OF CARE SCREENING TOOL     IDENTIFICATION  Patient Name: Kristin Adkins Birthdate: 1920-09-21 Sex: female Admission Date (Current Location): 08/11/2020  [facility] and [place] Number:  Herbalist and Address:  [facility] [place], [place] [address] [place], [place] [address]      Provider Number: [id number]  Attending Physician Name and Address:  Darliss Cheney, MD  Relative Name and Phone Number:  CASLYN, GUENTHNER)   [phone] [facility])    Current Level of Care:   Recommended Level of Care: [place] Prior Approval Number:    Date Approved/Denied:   PASRR Number: [id number]  Discharge Plan: SNF    Current Diagnoses: Patient Active Problem List   Diagnosis Date Noted  . Pressure injury of skin 08/12/2020  . SIRS (systemic inflammatory response syndrome) ([place]) 08/11/2020  . Acute kidney injury superimposed on chronic kidney disease ([place]) 08/11/2020  . Acute on chronic combined systolic and diastolic CHF (congestive heart failure) ([place]) 07/30/2020  . DNR (do not resuscitate) 07/30/2020  . Acute on chronic respiratory failure with hypoxia and hypercapnia ([place]) 07/30/2020  . AF (paroxysmal atrial fibrillation) ([place]) 07/30/2020  . Chronic anticoagulation 07/30/2020  . History of COVID-19 07/30/2020  . Transient hypotension 07/30/2020  . Type 2 diabetes mellitus without complication ([place]) [id number]  . Prolonged QT interval 07/30/2020  . Acute lower UTI 07/30/2020  . Respiratory failure (HCC)     Orientation RESPIRATION BLADDER Height & Weight     Self,Place  O2 Incontinent Weight: 147 lb 4.3 oz (66.8 kg) Height:     BEHAVIORAL SYMPTOMS/MOOD NEUROLOGICAL BOWEL NUTRITION STATUS      Continent Diet (heart healthy/carb modified.  See discharge summary)  AMBULATORY STATUS COMMUNICATION OF NEEDS Skin   Limited Assist Verbally Other (Comment) (ecchymosis)                       Personal Care  Assistance Level of Assistance    Bathing Assistance: Limited assistance Feeding assistance: Independent Dressing Assistance: Limited assistance     Functional Limitations Info    Sight Info: Impaired Hearing Info: Impaired Speech Info: Adequate    SPECIAL CARE FACTORS FREQUENCY  PT (By licensed PT),OT (By licensed OT)     PT Frequency: 5x week OT Frequency: 5x week            Contractures Contractures Info: Not present    Additional Factors Info    Code Status Info: DNR Allergies Info: Codeine, Nitrofurantoin, Penicillins           Current Medications (08/14/2020):  This is the current hospital active medication list Current Facility-Administered Medications  Medication Dose Route Frequency Provider Last Rate Last Admin  . 0.9 %  sodium chloride infusion  250 mL Intravenous PRN Fuller Plan A, MD      . acetaminophen (TYLENOL) tablet 650 mg  650 mg Oral Q4H PRN Fuller Plan A, MD   650 mg at 08/12/20 2040  . amiodarone (PACERONE) tablet 100 mg  100 mg Oral Daily Tamala Julian, Rondell A, MD   100 mg at 08/14/20 0852  . apixaban (ELIQUIS) tablet 2.5 mg  2.5 mg Oral BID Fuller Plan A, MD   2.5 mg at 08/14/20 0853  . Chlorhexidine Gluconate Cloth 2 % PADS 6 each  6 each Topical Daily Norval Morton, MD   6 each at 08/14/20 0902  . furosemide (LASIX) injection 40 mg  40 mg Intravenous BID Sande Rives  E, PA-C   40 mg at 08/14/20 0853  . insulin aspart (novoLOG) injection 0-6 Units  0-6 Units Subcutaneous Q4H Norval Morton, MD   1 Units at 08/12/20 2044  . LORazepam (ATIVAN) injection 0.25-0.5 mg  0.25-0.5 mg Intravenous Q6H PRN Fuller Plan A, MD   0.5 mg at 08/13/20 1624  . magnesium oxide (MAG-OX) tablet 400 mg  400 mg Oral Daily Fuller Plan A, MD   400 mg at 08/14/20 0852  . melatonin tablet 6 mg  6 mg Oral QHS Fuller Plan A, MD   6 mg at 08/12/20 2039  . oxyCODONE (Oxy IR/ROXICODONE) immediate release tablet 5 mg  5 mg Oral Q6H PRN Pahwani, Ravi, MD       . sodium chloride flush (NS) 0.9 % injection 3 mL  3 mL Intravenous Q12H Smith, Rondell A, MD   3 mL at 08/14/20 0853  . sodium chloride flush (NS) 0.9 % injection 3 mL  3 mL Intravenous PRN Smith, Rondell A, MD      . zinc oxide 20 % ointment 1 application  1 application Topical BID Norval Morton, MD   1 application at [id number]     Discharge Medications: Please see discharge summary for a list of discharge medications.  Relevant Imaging Results:  Relevant Lab Results:   Additional Information SSN [id number]  Joanne Chars, LCSW

## 2020-08-14 NOTE — Progress Notes (Signed)
Appropriate Use Committee Chart Review  Chart reviewed by the physician advisor with input from [facility] and the attending MD as needed for review of the appropriateness for SNF referral.  TOC notes, PT/OT/ST notes, nursing notes and physician notes reviewed for medical necessity to determine if the patient's needs are appropriate for short-term rehab to return to a prior level of function versus the likely need for custodial care.  At this time, the patient does not appear to meet Medicare criteria for SNF placement. Per TOC notes, 7 admissions in the past 3 months and has been at [facility] for rehab several times.  Recommendations: The patient is not SNF appropriate for short-term rehab/skilled nursing interventions.  A consult to the Transitions of Care Team has been made to discuss alternative options and discharge planning with the patient/family. Recommend that she return to her ALF with private pay care.   Jacquelynn Cree, MD Chief Physician Advisor  08/14/2020 2:05 PM

## 2020-08-14 NOTE — TOC Initial Note (Addendum)
Transition of Care [facility]) - Initial/Assessment Note    Patient Details  Name: Kristin Adkins MRN: [id number] Date of Birth: 1921-05-21  Transition of Care [facility]) CM/SW Contact:    Joanne Chars, LCSW Phone Number: 08/14/2020, 10:42 AM  Clinical Narrative:      CSW met with pt and pt son [name] in the room.  Pt hard of hearing and unable to participate much.  Son is POA and provided copy of POA form which CSW copied and placed in the chart.  CSW confirmed that pt is from [facility], prior to that has been staying at [facility] in [place]. Family was private paying for this.  Per son, pt has had 7 admissions in the past 3 months and has been at [facility] several times.  Son has questions about whether pt is medically ready for discharge, has not met with MD yet today, and also is supposed to meet with palliative team this morning as well.  He would like to have these conversations before a final decision is made.  Pt is vaccinated and boosted.    CSW confirmed with Olivia Mackie at [facility] that pt is eligible to return.  Insurance Josem Kaufmann was started with [employer].            Expected Discharge Plan: Skilled Nursing Facility Barriers to Discharge: Insurance Authorization   Patient Goals and CMS Choice   CMS Medicare.gov Compare Post Acute Care list provided to::  (Pt is from [place] and son would like pt to return.)    Expected Discharge Plan and Services Expected Discharge Plan: [place] In-house Referral: Clinical Social Work   Post Acute Care Choice: [place] Living arrangements for the past 2 months: [place]                                      Prior Living Arrangements/Services Living arrangements for the past 2 months: [place] Lives with:: Facility Resident Patient language and need for interpreter reviewed:: Yes        Need for Family Participation in Patient Care: Yes (Comment) Care giver support system in  place?: Yes (comment) Current home services: Other (comment) Criminal Activity/Legal Involvement Pertinent to Current Situation/Hospitalization: No - Comment as needed  Activities of Daily Living Home Assistive Devices/Equipment: Wheelchair ADL Screening (condition at time of admission) Patient's cognitive ability adequate to safely complete daily activities?: No Is the patient deaf or have difficulty hearing?: No Does the patient have difficulty seeing, even when wearing glasses/contacts?: No Does the patient have difficulty concentrating, remembering, or making decisions?: Yes Patient able to express need for assistance with ADLs?: Yes Does the patient have difficulty dressing or bathing?: Yes Independently performs ADLs?: No Communication: Independent Dressing (OT): Needs assistance Is this a change from baseline?: Pre-admission baseline Grooming: Needs assistance Is this a change from baseline?: Pre-admission baseline Feeding: Needs assistance Is this a change from baseline?: Pre-admission baseline Bathing: Needs assistance Is this a change from baseline?: Pre-admission baseline Toileting: Needs assistance Is this a change from baseline?: Pre-admission baseline In/Out Bed: Needs assistance Is this a change from baseline?: Pre-admission baseline Walks in Home: Needs assistance Is this a change from baseline?: Pre-admission baseline Does the patient have difficulty walking or climbing stairs?: Yes Weakness of Legs: Both Weakness of Arms/Hands: None  Permission Sought/Granted  Emotional Assessment Appearance:: Appears stated age Attitude/Demeanor/Rapport: Unable to Assess Affect (typically observed): Unable to Assess Orientation: : Oriented to Self,Oriented to Place Alcohol / Substance Use: Not Applicable Psych Involvement: No (comment)  Admission diagnosis:  Acute pulmonary edema (HCC) [J81.0] Pleural effusion [J90] Acute respiratory failure with  hypoxia (HCC) [J96.01] Acute renal failure, unspecified acute renal failure type [facility]) [N17.9] Patient Active Problem List   Diagnosis Date Noted  . Pressure injury of skin 08/12/2020  . SIRS (systemic inflammatory response syndrome) ([place]) 08/11/2020  . Acute kidney injury superimposed on chronic kidney disease ([place]) 08/11/2020  . Acute on chronic combined systolic and diastolic CHF (congestive heart failure) ([place]) 07/30/2020  . DNR (do not resuscitate) 07/30/2020  . Acute on chronic respiratory failure with hypoxia and hypercapnia ([place]) 07/30/2020  . AF (paroxysmal atrial fibrillation) ([place]) 07/30/2020  . Chronic anticoagulation 07/30/2020  . History of COVID-19 07/30/2020  . Transient hypotension 07/30/2020  . Type 2 diabetes mellitus without complication ([place]) 53/20/2334  . Prolonged QT interval 07/30/2020  . Acute lower UTI 07/30/2020  . Respiratory failure ([place])    PCP:  Jene Every, MD Pharmacy:  No Pharmacies Listed    Social Determinants of Health (SDOH) Interventions    Readmission Risk Interventions No flowsheet data found.

## 2020-08-14 NOTE — Progress Notes (Signed)
PROGRESS NOTE    Kristin Adkins  [id number] DOB: [birth year] DOA: 08/11/2020 PCP: Jene Every, MD   Brief Narrative:  Kristin Adkins is a 87 y.o. female with medical history significant of hypertension, combined systolic and diastolic CHF(last EF [id number] with grade 2 diastolic dysfunction), atrial fibrillation on anticoagulation, chronic kidney disease stage IV with solitary kidney, and COVID-19 pneumonia 06/25/2020 who presented with shortness of breath. Found by EMS with O2 saturations in the 70s and was placed on CPAP.  Upon admission to the emergency department patient was seen to be afebrile, pulse 85-1 21, respiration 25-38, blood pressure 98/41-144/63, and O2 saturations maintained around 97% on BiPAP.  Labs significant for WBC 13.3, BUN 24, creatinine 2.3, glucose 285, albumin 3.2, AST 43, BNP 1300.9, and troponin 24.  pH 7.204, pCO2Chest x-ray consistent for increasing CHF with small bilateral pleural effusions.  Patient was given Lasix 40 mg IV and admitted to hospital service and cardiology consulted.  Subsequently next day, patient complained of chest pain, telemetry showed possible ST elevation MI.  EKG was done which did show ST-T wave changes.  Troponins were rechecked and this time they were higher.  Patient however remained asymptomatic.  Cardiology was reached out and they had recommended conservative management due to patient's age.  Patient was continued on IV twice daily Lasix for acute on chronic combined systolic and diastolic heart failure.  Assessment & Plan:   Principal Problem:   Acute on chronic combined systolic and diastolic CHF (congestive heart failure) (HCC) Active Problems:   DNR (do not resuscitate)   Acute on chronic respiratory failure with hypoxia and hypercapnia (HCC)   AF (paroxysmal atrial fibrillation) (HCC)   Chronic anticoagulation   History of COVID-19   SIRS (systemic inflammatory response syndrome) (HCC)   Acute kidney injury superimposed on  chronic kidney disease (HCC)   Pressure injury of skin   Acute respiratory failure with hypoxia and hypercapnia secondary to combined systolic and diastolic CHF exacerbation: Patient presents with respiratory distress with O2 saturations noted to be in the 70s.  She was placed on BiPAP.  ABG was significant for pH 7.204, PCO2 61.2, and PO2 88 concerning for a respiratory acidosis.  Chest x-ray significant for increasing CHF with small bilateral pleural effusions.  BNP 1300.9 which is increased from previous admission.  During discharge she had been started on torosemide 20 mg daily by cardiology.  Last echocardiogram on 07/18/2020 noted EF of 30- 35% which was decreased from previous of >55% in 05/2020.  Echo at this time again shows ejection fraction of 30 to 35% with global hypokinesis and grade 1 diastolic dysfunction.  She has received enough of IV Lasix.  Now she looks euvolemic.  We will stop IV Lasix and start her on torsemide 20 mg starting tomorrow.  SIRS: Patient was noted to be tachycardic and tachypneic with WBC elevated at 13.3.  At the time of admission.  All these parameters have normalized now.  No suspicion of infection.  Acute kidney injury superimposed on chronic kidney disease stage IV: Patient with solitary kidney.  Baseline creatinine about 1.9.  Presented with 2.3.  No change from yesterday.  Could be cardiorenal syndrome.  Monitor.  Elevated troponin/EKG changes/non-ST elevation MI: Denies any chest pain or shortness of breath.  On the morning of 08/12/2020 telemetry called for some ST changes on the leads.  EKG was done.  This shows ST elevation on V1 through V3 with reciprocal ST depression from V4 to V6.  This was  brought up to my attention of cardiology and due to her advanced age, they recommended continue current medical management.  Transthoracic echo shows 30 to 35% ejection fraction, no change from previously.  Global hypokinesis.  Patient is asymptomatic today.  Continue  Eliquis.  Check lipid panel.  Diabetes mellitus type 2 with hyperglycemia: Acute.  On admission glucose elevated up to 285.  Last hemoglobin A1c was 5.2 on 07/30/2020.  Suspect elevated glucose secondary to acute stress response.  Currently blood sugar within normal range.  Continue SSI.  Essential hypertension: Blood pressures initially noted to be soft.  Home blood pressure medications include torsemide 20 mg daily and hydralazine 10 mg every 8 hours.  Blood pressure still slightly soft.  Continue to hold antihypertensives.  Paroxysmal atrial fibrillation on chronic anticoagulation: Currently in sinus rhythm.  Continue Eliquis and amiodarone.   Prolonged QT interval: Acute on chronic.  On admission QTC 550.  Keep electrolytes within normal range. -Holding additional QT prolonging medications.  Repeat EKG tomorrow morning.  History of COVID-19 infection: Patient had pneumonia due to COVID-19 on 1/16.  DNR: Present on admission.  Frequent hospitalizations: Palliative care on board.  They had long discussion with the son last week and today.  Son is not ready for transitioning to hospice or palliative care.  He wants [place] to pursue SNF for now until he makes any further decisions.  Physical deconditioning/generalized weakness: Seen by PT OT.  Although patient is 87 year old but she does seem to have potential for rehabilitation.  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  Patient stable for discharge anytime.  DVT prophylaxis: apixaban (ELIQUIS) tablet 2.5 mg Start: 08/11/20 1000   Code Status: DNR  Family Communication: Discussed with son at the bedside.  Status is: Inpatient  Remains inpatient appropriate because:Inpatient level of care appropriate due to severity of illness   Dispo: The patient is from: SNF              Anticipated d/c is to: SNF              Patient currently is medically stable to d/c.   Difficult to place patient No        Estimated body mass  index is 26.94 kg/m as calculated from the following:   Height as of 07/30/20: '5\' 2"'$  (1.575 m).   Weight as of this encounter: 66.8 kg.  Pressure Injury 08/11/20 Heel Right Stage 1 -  Intact skin with non-blanchable redness of a localized area usually over a bony prominence. nonblanchable area to right heel (Active)  08/11/20 1557  Location: Heel  Location Orientation: Right  Staging: Stage 1 -  Intact skin with non-blanchable redness of a localized area usually over a bony prominence.  Wound Description (Comments): nonblanchable area to right heel  Present on Admission: Yes     Nutritional status:               Consultants:   Cardiology  Palliative medicine  Procedures:   None  Antimicrobials:  Anti-infectives (From admission, onward)   None         Subjective: Seen and examined twice today.  Patient sitting in a recliner.  Eating breakfast.  Denied any chest pain or shortness of breath.  Objective: Vitals:   08/14/20 0356 08/14/20 0357 08/14/20 0746 08/14/20 1219  BP: (!) 125/48  107/82 (!) 119/51  Pulse: 81  78 79  Resp: '18  20 20  '$ Temp: 97.9 F (36.6 C)  98.3 F (36.8  C) 97.9 F (36.6 C)  TempSrc: Oral  Oral Oral  SpO2: 95%  98% 96%  Weight:  66.8 kg      Intake/Output Summary (Last 24 hours) at 08/14/2020 1256 Last data filed at 08/14/2020 0000 Gross per 24 hour  Intake --  Output 800 ml  Net -800 ml   Filed Weights   08/12/20 0500 08/13/20 0500 08/14/20 0357  Weight: 71.4 kg 69.4 kg 66.8 kg    Examination:  General exam: Appears calm and comfortable  Respiratory system: Clear to auscultation. Respiratory effort normal. Cardiovascular system: S1 & S2 heard, RRR. No JVD, murmurs, rubs, gallops or clicks. No pedal edema. Gastrointestinal system: Abdomen is nondistended, soft and nontender. No organomegaly or masses felt. Normal bowel sounds heard. Central nervous system: Alert and oriented. No focal neurological deficits. Extremities:  Symmetric 5 x 5 power. Skin: No rashes, lesions or ulcers.  Psychiatry: Judgement and insight appear poor  Data Reviewed: I have personally reviewed following labs and imaging studies  CBC: Recent Labs  Lab 08/11/20 0536 08/11/20 0617 08/12/20 0131 08/12/20 1058  WBC 13.3*  --  5.6 5.3  NEUTROABS 4.9  --  3.7 3.9  HGB 13.2  14.3 14.6 10.9* 11.4*  HCT 44.1  42.0 43.0 35.1* 36.8  MCV 95.5  --  92.1 91.8  PLT 160  --  108* [id number]*   Basic Metabolic Panel: Recent Labs  Lab 08/11/20 0536 08/11/20 0617 08/11/20 1015 08/12/20 0131 08/13/20 0116 08/14/20 0207  NA 136  134* 136  --  136 135 135  K 4.8  6.3* 5.1  --  4.9 4.9 4.8  CL 102  104  --   --  102 99 96*  CO2 19*  --   --  '24 27 26  '$ GLUCOSE 285*  304*  --   --  92 98 108*  BUN 24*  41*  --   --  28* 28* 31*  CREATININE 2.30*  2.10*  --   --  2.34* 2.24* 2.09*  CALCIUM 10.2  --   --  9.5 9.7 10.0  MG  --   --  2.4  --  2.2  --    GFR: Estimated Creatinine Clearance: 13.8 mL/min (A) (by C-G formula based on SCr of 2.09 mg/dL (H)). Liver Function Tests: Recent Labs  Lab 08/11/20 0536  AST 43*  ALT 26  ALKPHOS 83  BILITOT 1.0  PROT 6.5  ALBUMIN 3.2*   No results for input(s): LIPASE, AMYLASE in the last 168 hours. No results for input(s): AMMONIA in the last 168 hours. Coagulation Profile: No results for input(s): INR, PROTIME in the last 168 hours. Cardiac Enzymes: No results for input(s): CKTOTAL, CKMB, CKMBINDEX, TROPONINI in the last 168 hours. BNP (last 3 results) No results for input(s): PROBNP in the last 8760 hours. HbA1C: No results for input(s): HGBA1C in the last 72 hours. CBG: Recent Labs  Lab 08/13/20 2234 08/13/20 2356 08/14/20 0355 08/14/20 0744 08/14/20 1217  GLUCAP 108* 111* 111* 103* 114*   Lipid Profile: No results for input(s): CHOL, HDL, LDLCALC, TRIG, CHOLHDL, LDLDIRECT in the last 72 hours. Thyroid Function Tests: No results for input(s): TSH, T4TOTAL, FREET4, T3FREE,  THYROIDAB in the last 72 hours. Anemia Panel: No results for input(s): VITAMINB12, FOLATE, FERRITIN, TIBC, IRON, RETICCTPCT in the last 72 hours. Sepsis Labs: Recent Labs  Lab 08/11/20 1534 08/12/20 0830 08/12/20 1037  LATICACIDVEN 2.3* 1.0 1.6    Recent Results (from the past 240 hour(s))  Resp Panel by RT-PCR (Flu A&B, Covid) Nasopharyngeal Swab     Status: None   Collection Time: 08/11/20  4:42 AM   Specimen: Nasopharyngeal Swab; Nasopharyngeal(NP) swabs in vial transport medium  Result Value Ref Range Status   SARS Coronavirus 2 by RT PCR NEGATIVE NEGATIVE Final    Comment: (NOTE) SARS-CoV-2 target nucleic acids are NOT DETECTED.  The SARS-CoV-2 RNA is generally detectable in upper respiratory specimens during the acute phase of infection. The lowest concentration of SARS-CoV-2 viral copies this assay can detect is 138 copies/mL. A negative result does not preclude SARS-Cov-2 infection and should not be used as the sole basis for treatment or other patient management decisions. A negative result may occur with  improper specimen collection/handling, submission of specimen other than nasopharyngeal swab, presence of viral mutation(s) within the areas targeted by this assay, and inadequate number of viral copies(<138 copies/mL). A negative result must be combined with clinical observations, patient history, and epidemiological information. The expected result is Negative.  Fact Sheet for Patients:  EntrepreneurPulse.com.au  Fact Sheet for Healthcare Providers:  IncredibleEmployment.be  This test is no t yet approved or cleared by the [place] FDA and  has been authorized for detection and/or diagnosis of SARS-CoV-2 by FDA under an Emergency Use Authorization (EUA). This EUA will remain  in effect (meaning this test can be used) for the duration of the COVID-19 declaration under Section 564(b)(1) of the Act, 21 U.S.C.section  360bbb-3(b)(1), unless the authorization is terminated  or revoked sooner.       Influenza A by PCR NEGATIVE NEGATIVE Final   Influenza B by PCR NEGATIVE NEGATIVE Final    Comment: (NOTE) The Xpert Xpress SARS-CoV-2/FLU/RSV plus assay is intended as an aid in the diagnosis of influenza from Nasopharyngeal swab specimens and should not be used as a sole basis for treatment. Nasal washings and aspirates are unacceptable for Xpert Xpress SARS-CoV-2/FLU/RSV testing.  Fact Sheet for Patients: EntrepreneurPulse.com.au  Fact Sheet for Healthcare Providers: IncredibleEmployment.be  This test is not yet approved or cleared by the [place] FDA and has been authorized for detection and/or diagnosis of SARS-CoV-2 by FDA under an Emergency Use Authorization (EUA). This EUA will remain in effect (meaning this test can be used) for the duration of the COVID-19 declaration under Section 564(b)(1) of the Act, 21 U.S.C. section 360bbb-3(b)(1), unless the authorization is terminated or revoked.  Performed at [facility], [place] [address] [place], [place] [address]   Culture, blood (routine x 2)     Status: None (Preliminary result)   Collection Time: 08/11/20 10:15 AM   Specimen: BLOOD LEFT HAND  Result Value Ref Range Status   Specimen Description BLOOD LEFT HAND  Final   Special Requests   Final    BOTTLES DRAWN AEROBIC AND ANAEROBIC Blood Culture results may not be optimal due to an inadequate volume of blood received in culture bottles   Culture   Final    NO GROWTH 3 DAYS Performed at [facility], [place] [address], [place], [place] [address]    Report Status PENDING  Incomplete  Culture, blood (routine x 2)     Status: None (Preliminary result)   Collection Time: 08/11/20  3:34 PM   Specimen: BLOOD RIGHT HAND  Result Value Ref Range Status   Specimen Description BLOOD RIGHT HAND  Final   Special Requests   Final    BOTTLES DRAWN  AEROBIC ONLY Blood Culture results may not be optimal due  to an inadequate volume of blood received in culture bottles   Culture   Final    NO GROWTH 3 DAYS Performed at [facility], [place] [address], [place], [place] [address]    Report Status PENDING  Incomplete      Radiology Studies: ECHOCARDIOGRAM COMPLETE  Result Date: 08/12/2020    ECHOCARDIOGRAM REPORT   Patient Name:   Kristin Adkins Date of Exam: 08/12/2020 Medical Rec #:  [id number]      Height:       62.0 in Accession #:    [id number]     Weight:       157.4 lb Date of Birth:  Jun 13, 1920     BSA:          1.727 m Patient Age:    65 years       BP:           126/56 mmHg Patient Gender: F              HR:           75 bpm. Exam Location:  Inpatient Procedure: 2D Echo, Cardiac Doppler and Color Doppler Indications:    CHF, CAD  History:        Patient has prior history of Echocardiogram examinations, most                 recent 07/18/2020. at [facility] CHF; CAD.  Sonographer:    Merrie Roof RDCS Referring Phys: [id number] [place]  1. Left ventricular ejection fraction, by estimation, is 30 to 35%. The left ventricle has moderately decreased function. The left ventricle demonstrates global hypokinesis. The left ventricular internal cavity size was mildly dilated. Left ventricular diastolic parameters are consistent with Grade I diastolic dysfunction (impaired relaxation). Elevated left atrial pressure.  2. Right ventricular systolic function is normal. The right ventricular size is normal. There is moderately elevated pulmonary artery systolic pressure. The estimated right ventricular systolic pressure is 123XX123 mmHg.  3. Left atrial size was mild to moderately dilated.  4. The mitral valve is normal in structure. Mild to moderate mitral valve regurgitation.  5. Tricuspid valve regurgitation is mild to moderate.  6. The aortic valve is tricuspid. Aortic valve regurgitation is mild. Mild aortic valve sclerosis is present, with no  evidence of aortic valve stenosis.  7. The inferior vena cava is normal in size with greater than 50% respiratory variability, suggesting right atrial pressure of 3 mmHg. Comparison(s): Prior images unable to be directly viewed, comparison made by report only. No significant change from prior study. FINDINGS  Left Ventricle: Left ventricular ejection fraction, by estimation, is 30 to 35%. The left ventricle has moderately decreased function. The left ventricle demonstrates global hypokinesis. The left ventricular internal cavity size was mildly dilated. There is no left ventricular hypertrophy. Abnormal (paradoxical) septal motion, consistent with left bundle branch block. Left ventricular diastolic parameters are consistent with Grade I diastolic dysfunction (impaired relaxation). Elevated left atrial pressure. Right Ventricle: The right ventricular size is normal. No increase in right ventricular wall thickness. Right ventricular systolic function is normal. There is moderately elevated pulmonary artery systolic pressure. The tricuspid regurgitant velocity is 3.62 m/s, and with an assumed right atrial pressure of 3 mmHg, the estimated right ventricular systolic pressure is 123XX123 mmHg. Left Atrium: Left atrial size was mild to moderately dilated. Right Atrium: Right atrial size was normal in size. Pericardium: The pericardium was not well visualized. Mitral Valve: The mitral valve is  normal in structure. Mild to moderate mitral valve regurgitation. Tricuspid Valve: The tricuspid valve is normal in structure. Tricuspid valve regurgitation is mild to moderate. Aortic Valve: The aortic valve is tricuspid. Aortic valve regurgitation is mild. Mild aortic valve sclerosis is present, with no evidence of aortic valve stenosis. Aortic valve mean gradient measures 4.0 mmHg. Aortic valve peak gradient measures 8.1 mmHg. Aortic valve area, by VTI measures 1.21 cm. Pulmonic Valve: The pulmonic valve was grossly normal. Pulmonic  valve regurgitation is trivial. Aorta: The aortic root is normal in size and structure. Venous: The inferior vena cava is normal in size with greater than 50% respiratory variability, suggesting right atrial pressure of 3 mmHg. IAS/Shunts: No atrial level shunt detected by color flow Doppler.  LEFT VENTRICLE PLAX 2D LVIDd:         5.70 cm      Diastology LVIDs:         4.70 cm      LV e' medial:    3.26 cm/s LV PW:         1.10 cm      LV E/e' medial:  28.3 LV IVS:        1.10 cm      LV e' lateral:   4.68 cm/s LVOT diam:     2.00 cm      LV E/e' lateral: 19.7 LV SV:         38 LV SV Index:   22 LVOT Area:     3.14 cm                              3D Volume EF: LV Volumes (MOD)            3D EF:        29 % LV vol d, MOD A2C: 116.0 ml LV EDV:       141 ml LV vol d, MOD A4C: 95.0 ml  LV ESV:       101 ml LV vol s, MOD A2C: 69.0 ml  LV SV:        40 ml LV vol s, MOD A4C: 83.6 ml LV SV MOD A2C:     47.0 ml LV SV MOD A4C:     95.0 ml LV SV MOD BP:      30.6 ml RIGHT VENTRICLE          IVC RV Basal diam:  3.70 cm  IVC diam: 1.30 cm LEFT ATRIUM             Index       RIGHT ATRIUM           Index LA diam:        3.80 cm 2.20 cm/m  RA Area:     10.80 cm LA Vol (A2C):   69.7 ml 40.37 ml/m RA Volume:   24.50 ml  14.19 ml/m LA Vol (A4C):   45.3 ml 26.24 ml/m LA Biplane Vol: 56.9 ml 32.95 ml/m  AORTIC VALVE AV Area (Vmax):    1.66 cm AV Area (Vmean):   1.39 cm AV Area (VTI):     1.21 cm AV Vmax:           142.00 cm/s AV Vmean:          97.500 cm/s AV VTI:            0.312 m AV Peak Grad:  8.1 mmHg AV Mean Grad:      4.0 mmHg LVOT Vmax:         75.10 cm/s LVOT Vmean:        43.200 cm/s LVOT VTI:          0.120 m LVOT/AV VTI ratio: 0.38  AORTA Ao Root diam: 2.80 cm MITRAL VALVE                 TRICUSPID VALVE MV Area (PHT): 4.06 cm      TR Peak grad:   52.4 mmHg MV Decel Time: 187 msec      TR Vmax:        362.00 cm/s MR Peak grad:    82.4 mmHg MR Vmax:         454.00 cm/s SHUNTS MR PISA:         1.01 cm     Systemic VTI:  0.12 m MR PISA Eff ROA: 7 mm       Systemic Diam: 2.00 cm MR PISA Radius:  0.40 cm MV E velocity: 92.10 cm/s MV A velocity: 118.00 cm/s MV E/A ratio:  0.78 Mihai Croitoru MD Electronically signed by Sanda Klein MD Signature Date/Time: 08/12/2020/5:26:21 PM    Final    VAS [place] LOWER EXTREMITY VENOUS (DVT)  Result Date: 08/13/2020  Lower Venous DVT Study Indications: Edema.  Limitations: Poor ultrasound/tissue interface. Comparison Study: No previous exams Performing Technologist: Rogelia Rohrer  Examination Guidelines: A complete evaluation includes B-mode imaging, spectral Doppler, color Doppler, and power Doppler as needed of all accessible portions of each vessel. Bilateral testing is considered an integral part of a complete examination. Limited examinations for reoccurring indications may be performed as noted. The reflux portion of the exam is performed with the patient in reverse Trendelenburg.  +---------+---------------+---------+-----------+----------+--------------+ RIGHT    CompressibilityPhasicitySpontaneityPropertiesThrombus Aging +---------+---------------+---------+-----------+----------+--------------+ CFV      Full           Yes      Yes                                 +---------+---------------+---------+-----------+----------+--------------+ SFJ      Full                                                        +---------+---------------+---------+-----------+----------+--------------+ FV Prox  Full           Yes      Yes                                 +---------+---------------+---------+-----------+----------+--------------+ FV Mid   Full           Yes      Yes                                 +---------+---------------+---------+-----------+----------+--------------+ FV DistalFull           Yes      Yes                                 +---------+---------------+---------+-----------+----------+--------------+ PFV  Full                                                         +---------+---------------+---------+-----------+----------+--------------+ POP      Full           Yes      Yes                                 +---------+---------------+---------+-----------+----------+--------------+ PTV      Full                                                        +---------+---------------+---------+-----------+----------+--------------+ PERO     Full                                                        +---------+---------------+---------+-----------+----------+--------------+   +---------+---------------+---------+-----------+----------+-------------------+ LEFT     CompressibilityPhasicitySpontaneityPropertiesThrombus Aging      +---------+---------------+---------+-----------+----------+-------------------+ CFV      Full           Yes      Yes                                      +---------+---------------+---------+-----------+----------+-------------------+ SFJ      Full                                                             +---------+---------------+---------+-----------+----------+-------------------+ FV Prox  Full           Yes      Yes                                      +---------+---------------+---------+-----------+----------+-------------------+ FV Mid   Full           Yes      Yes                                      +---------+---------------+---------+-----------+----------+-------------------+ FV DistalFull           Yes      Yes                                      +---------+---------------+---------+-----------+----------+-------------------+ PFV      Full                                                             +---------+---------------+---------+-----------+----------+-------------------+  POP      Full           Yes      Yes                                       +---------+---------------+---------+-----------+----------+-------------------+ PTV      Full                                         Not well visualized +---------+---------------+---------+-----------+----------+-------------------+   Left Technical Findings: Not visualized segments include Peroneal veins.   Summary: BILATERAL: - No evidence of deep vein thrombosis seen in the lower extremities, bilaterally. - No evidence of superficial venous thrombosis in the lower extremities, bilaterally. -No evidence of popliteal cyst, bilaterally.   *See table(s) above for measurements and observations.    Preliminary     Scheduled Meds: . amiodarone  100 mg Oral Daily  . apixaban  2.5 mg Oral BID  . Chlorhexidine Gluconate Cloth  6 each Topical Daily  . insulin aspart  0-6 Units Subcutaneous Q4H  . magnesium oxide  400 mg Oral Daily  . melatonin  6 mg Oral QHS  . sodium chloride flush  3 mL Intravenous Q12H  . [START ON 08/15/2020] torsemide  20 mg Oral Daily  . zinc oxide  1 application Topical BID   Continuous Infusions: . sodium chloride       LOS: 3 days   Time spent: 32 minutes   Darliss Cheney, MD [facility]  08/14/2020, 12:56 PM   To contact the attending provider between 7A-7P or the covering provider during after hours 7P-7A, please log into the web site [email or web address].

## 2020-08-15 DIAGNOSIS — I214 Non-ST elevation (NSTEMI) myocardial infarction: Secondary | ICD-10-CM

## 2020-08-15 LAB — BASIC METABOLIC PANEL
Anion gap: 10 (ref 5–15)
BUN: 31 mg/dL — ABNORMAL HIGH (ref 8–23)
CO2: 29 mmol/L (ref 22–32)
Calcium: 9.9 mg/dL (ref 8.9–10.3)
Chloride: 95 mmol/L — ABNORMAL LOW (ref 98–111)
Creatinine, Ser: 2.08 mg/dL — ABNORMAL HIGH (ref 0.44–1.00)
GFR, Estimated: 21 mL/min — ABNORMAL LOW (ref >60)
Glucose, Bld: 105 mg/dL — ABNORMAL HIGH (ref 70–99)
Potassium: 4.2 mmol/L (ref 3.5–5.1)
Sodium: 134 mmol/L — ABNORMAL LOW (ref 135–145)

## 2020-08-15 LAB — GLUCOSE, CAPILLARY
Glucose-Capillary: 104 mg/dL — ABNORMAL HIGH (ref 70–99)
Glucose-Capillary: 104 mg/dL — ABNORMAL HIGH (ref 70–99)
Glucose-Capillary: 106 mg/dL — ABNORMAL HIGH (ref 70–99)
Glucose-Capillary: 108 mg/dL — ABNORMAL HIGH (ref 70–99)

## 2020-08-15 NOTE — Discharge Instructions (Signed)
Heart Attack A heart attack occurs when blood and oxygen supply to the heart is cut off. A heart attack causes damage to the heart that cannot be fixed. A heart attack is also called a myocardial infarction, or MI. If you think you are having a heart attack, do not wait to see if the symptoms will go away. Get medical help right away. What are the causes? This condition may be caused by:  A fatty substance (plaque) in the blood vessels (arteries). This can block the flow of blood to the heart.  A blood clot in the blood vessels that go to the heart. The blood clot blocks blood flow.  Low blood pressure.  An abnormal heartbeat.  Some diseases, such as problems in red blood cells (anemia)orproblems in breathing (respiratory failure).  Tightening (spasm) of a blood vessel that cuts off blood to the heart.  A tear in a blood vessel of the heart.  High blood pressure.   What increases the risk? The following factors may make you more likely to develop this condition:  Aging. The older you are, the higher your risk.  Having a personal or family history of chest pain, heart attack, stroke, or narrowing of the arteries in the legs, arms, head, or stomach (peripheral artery disease).  Being female.  Smoking.  Not getting regular exercise.  Being overweight or obese.  Having high blood pressure.  Having high cholesterol.  Having diabetes.  Drinking too much alcohol.  Using illegal drugs, such as cocaine or methamphetamine. What are the signs or symptoms? Symptoms of this condition include:  Chest pain. It may feel like: ? Crushing or squeezing. ? Tightness, pressure, fullness, or heaviness.  Pain in the arm, neck, jaw, back, or upper body.  Shortness of breath.  Heartburn.  Upset stomach (indigestion).  Feeling like you may vomit (nauseous).  Cold sweats.  Feeling tired.  Sudden light-headedness. How is this treated? A heart attack must be treated as soon as  possible. Treatment may include:  Medicines to: ? Break up or dissolve blood clots. ? Thin blood and help prevent blood clots. ? Treat blood pressure. ? Improve blood flow to the heart. ? Reduce pain. ? Reduce cholesterol.  Procedures to widen a blocked artery and keep it open.  Open heart surgery.  Receiving oxygen.  Making your heart strong again (cardiac rehabilitation) through exercise, education, and counseling.   Follow these instructions at home: Medicines  Take over-the-counter and prescription medicines only as told by your doctor. You may need to take medicine: ? To keep your blood from clotting too easily. ? To control blood pressure. ? To lower cholesterol. ? To control heart rhythms.  Do not take these medicines unless your doctor says it is okay: ? NSAIDs, such as ibuprofen. ? Supplements that have vitamin A, vitamin E, or both. ? Hormone replacement therapy that has estrogen with or without progestin. Lifestyle  Do not use any products that have nicotine or tobacco, such as cigarettes, e-cigarettes, and chewing tobacco. If you need help quitting, ask your doctor.  Avoid secondhand smoke.  Exercise regularly. Ask your doctor about a cardiac rehab program.  Eat heart-healthy foods. Your doctor will tell you what foods to eat.  Stay at a healthy weight.  Lower your stress level.  Do not use illegal drugs.      Alcohol use  Do not drink alcohol if: ? Your doctor tells you not to drink. ? You are pregnant, may be pregnant, or   are planning to become pregnant.  If you drink alcohol: ? Limit how much you use to:  0-1 drink a day for women.  0-2 drinks a day for men. ? Know how much alcohol is in your drink. In the [place] one drink equals one 12 oz bottle of beer (355 mL), one 5 oz glass of wine (148 mL), or one 1 oz glass of hard liquor (44 mL). General instructions  Work with your doctor to treat other problems you may have, such as diabetes or  high blood pressure.  Get screened for depression. Get treatment if needed.  Keep your vaccines up to date. Get the flu shot (influenza vaccine) every year.  Keep all follow-up visits as told by your doctor. This is important. Contact a doctor if:  You feel very sad.  You have trouble doing your daily activities. Get help right away if:  You have sudden, unexplained discomfort in your chest, arms, back, neck, jaw, or upper body.  You have shortness of breath.  You have sudden sweating or clammy skin.  You feel like you may vomit.  You vomit.  You feel tired or weak.  You get light-headed or dizzy.  You feel your heart beating fast.  You feel your heart skipping beats.  You have blood pressure that is higher than 180/120. These symptoms may be an emergency. Do not wait to see if the symptoms will go away. Get medical help right away. Call your local emergency services (911 in the [place] Do not drive yourself to the hospital. Summary  A heart attack occurs when blood and oxygen supply to the heart is cut off.  Do not take NSAIDs unless your doctor says it is okay.  Do not smoke. Avoid secondhand smoke.  Exercise regularly. Ask your doctor about a cardiac rehab program. This information is not intended to replace advice given to you by your health care provider. Make sure you discuss any questions you have with your health care provider. Document Revised: 09/07/2018 Document Reviewed: 09/07/2018 Elsevier Patient Education  2021 [employer]  

## 2020-08-15 NOTE — TOC Progression Note (Signed)
Transition of Care [facility]) - Progression Note    Patient Details  Name: Kristin Adkins MRN: [id number] Date of Birth: 14-Apr-1921  [facility]) CM/SW Contact  Joanne Chars, LCSW Phone Number: 08/15/2020, 9:13 AM  Clinical Narrative:   SNF auth received from [place], 3 days 3/8-3/10 with review on 3/10 with Lurline Hare. Auth ID [id number]. Covid test back/negative.     Expected Discharge Plan: Skilled Nursing Facility Barriers to Discharge: [employer]  Expected Discharge Plan and Services Expected Discharge Plan: [place] In-house Referral: Clinical Social Work   Post Acute Care Choice: [place] Living arrangements for the past 2 months: [place]                                       Social Determinants of Health (SDOH) Interventions    Readmission Risk Interventions No flowsheet data found.

## 2020-08-15 NOTE — Discharge Summary (Signed)
Physician Discharge Summary  Kristin Adkins [id number] DOB: January 15, 1921 DOA: 08/11/2020  PCP: Jene Every, MD  Admit date: 08/11/2020 Discharge date: 08/15/2020 30 Day Unplanned Readmission Risk Score   Flowsheet Row ED to Hosp-Admission (Current) from 08/11/2020 in [place] [facility] [place] [facility]  30 Day Unplanned Readmission Risk Score (%) 17.56 Filed at 08/15/2020 0801     This score is the patient's risk of an unplanned readmission within 30 days of being discharged (0 -100%). The score is based on dignosis, age, lab data, medications, orders, and past utilization.   Low:  0-14.9   Medium: 15-21.9   High: 22-29.9   Extreme: 30 and above         Admitted From: Nursing home, claps Disposition: SNF  Recommendations for Outpatient Follow-up:  1. Follow up with PCP in 1-2 weeks 2. Please obtain BMP/CBC in one week 3. Please follow up with your PCP on the following pending results: Unresulted Labs (From admission, onward)          Start     Ordered   08/12/20 [id number]  Basic metabolic panel  Daily,   R      08/11/20 0745            Home Health: None Equipment/Devices: None  Discharge Condition: Stable CODE STATUS: DNR Diet recommendation: Cardiac  Subjective: Seen and examined. She has no complaints. No chest pain or shortness of breath.  Brief/Interim Summary: Kristin Adkins a 87 y.o.femalewith medical history significant ofhypertension, combined systolic and diastolic CHF(last EF [id number] grade 2 diastolic dysfunction),atrial fibrillation on anticoagulation, chronic kidney disease stage IV with solitary kidney, and COVID-19 pneumonia 06/25/2020 who presented with shortness of breath. Found by EMS with O2 saturations in the 70s and was placed on CPAP.  Upon admission to the emergency department patient was seen to be afebrile, pulse 85-1 21, respiration 25-38, blood pressure 98/41-144/63, and O2 saturations maintained around 97% on BiPAP. Labs significant for WBC  13.3, BUN 24, creatinine 2.3, glucose 285, albumin 3.2, AST 43, BNP 1300.9, and troponin24.pH 7.204, pCO2Chest x-ray consistent for increasing CHF with small bilateral pleural effusions.  Patient was given Lasix 40 mg IV and admitted to hospital service and cardiology consulted.  Subsequently next day, telemetry showed possible ST elevation MI.  EKG was done which did show ST-T wave changes.  Troponins were rechecked and this time they were significantly higher.  Patient however remained asymptomatic.  Cardiology was reached out and they had recommended conservative management due to patient's age.  Patient was continued on IV twice daily Lasix for acute on chronic combined systolic and diastolic heart failure. She had significant diuresis done. No more crackles. She is off of oxygen at room air. We will be checking her ambulatory oximetry and based on that, we will decide if she needs oxygen. PT OT recommended SNF. Palliative care was also involved and recommended back to nursing home with hospice or palliative care and they had a discussion/meeting with the son but son is not willing to consider those options at this point in time. He wants her mother to recover and go to SNF. Finally SNF has been arranged by TOC. She is being discharged in stable condition. I will resume her home dose of torsemide and all home medications.   Discharge Diagnoses:  Principal Problem:   Acute on chronic combined systolic and diastolic CHF (congestive heart failure) (HCC) Active Problems:   DNR (do not resuscitate)   Acute on chronic respiratory failure with hypoxia and  hypercapnia (HCC)   AF (paroxysmal atrial fibrillation) (HCC)   Chronic anticoagulation   History of COVID-19   SIRS (systemic inflammatory response syndrome) (HCC)   Acute kidney injury superimposed on chronic kidney disease ([place])   Pressure injury of skin   NSTEMI (non-ST elevated myocardial infarction) [facility])    Discharge  Instructions   Allergies as of 08/15/2020      Reactions   Codeine    Other reaction(s): UNKNOWN   Nitrofurantoin    Other reaction(s): UNKNOWN   Penicillins Rash      Medication List    STOP taking these medications   doxycycline 100 MG tablet Commonly known as: VIBRA-TABS     TAKE these medications   acetaminophen 500 MG tablet Commonly known as: TYLENOL Take 500 mg by mouth every 6 (six) hours as needed for fever or mild pain (discomfort).   albuterol (2.5 MG/3ML) 0.083% nebulizer solution Commonly known as: PROVENTIL Take 3 mLs (2.5 mg total) by nebulization every 6 (six) hours as needed for shortness of breath or wheezing.   amiodarone 100 MG tablet Commonly known as: PACERONE Take 100 mg by mouth daily.   apixaban 2.5 MG Tabs tablet Commonly known as: ELIQUIS Take 1 tablet (2.5 mg total) by mouth 2 (two) times daily.   Dermacloud Oint Apply 1 application topically See admin instructions. To buttocks every shift   famotidine 10 MG tablet Commonly known as: PEPCID Take 1 tablet (10 mg total) by mouth daily.   hydrALAZINE 10 MG tablet Commonly known as: APRESOLINE Take 1 tablet (10 mg total) by mouth every 8 (eight) hours.   magnesium oxide 400 MG tablet Commonly known as: MAG-OX Take 400 mg by mouth daily.   melatonin 3 MG Tabs tablet Take 6 mg by mouth at bedtime.   multivitamin with minerals tablet Take 1 tablet by mouth daily.   potassium chloride SA 20 MEQ tablet Commonly known as: KLOR-CON Take 1 tablet (20 mEq total) by mouth daily.   Skin Prep Wipes Misc Apply 1 application topically See admin instructions. Apply to bilateral heels every shift   torsemide 20 MG tablet Commonly known as: DEMADEX Take 1 tablet (20 mg total) by mouth daily.       Follow-up Information    Jene Every, MD Follow up in 1 week(s).   Specialty: Family Medicine Contact information: [address] [place] [address] [place] [address] [phone]               Allergies  Allergen Reactions  . Codeine     Other reaction(s): UNKNOWN  . Nitrofurantoin     Other reaction(s): UNKNOWN  . Penicillins Rash    Consultations: Cardiology   Procedures/Studies: DG Chest Portable 1 View  Result Date: 08/11/2020 CLINICAL DATA:  Shortness of breath EXAM: PORTABLE CHEST 1 VIEW COMPARISON:  07/31/2020 FINDINGS: Cardiac shadow is prominent but stable. Aortic calcifications are again identified. Bilateral pleural effusions are again identified relatively stable from the prior exam. Increasing vascular congestion and parenchymal edema is seen as well. No bony abnormality is noted. IMPRESSION: Changes consistent with increasing CHF and small bilateral pleural effusions. Electronically Signed   By: Inez Catalina M.D.   On: 08/11/2020 05:11   DG Chest Port 1 View  Result Date: 07/31/2020 CLINICAL DATA:  Shortness of breath EXAM: PORTABLE CHEST 1 VIEW COMPARISON:  Yesterday FINDINGS: Right more than left pleural effusion and adjacent pulmonary opacity, presumably atelectasis based on January 2022 chest CT. Cardiomegaly. Negative for pneumothorax  or convincing edema. IMPRESSION: Right more than left pleural effusion. Increased chest opacity from before may be from differences in positioning and layering. Electronically Signed   By: Monte Fantasia M.D.   On: 07/31/2020 08:11   DG Chest Portable 1 View  Result Date: 07/30/2020 CLINICAL DATA:  Shortness of breath.  Respiratory distress EXAM: PORTABLE CHEST 1 VIEW COMPARISON:  07/15/2020 FINDINGS: Chronic cardiomegaly. Hazy opacity at the bases primarily attributed to pleural effusion. There is superimposed pulmonary opacity that could be atelectasis or pneumonia. Upper lung markings are similar to before. No pneumothorax. IMPRESSION: Cardiomegaly, vascular congestion, and bilateral pleural effusion. Atelectasis or infiltrate may be superimposed at the lower lobes. Electronically Signed   By: Monte Fantasia M.D.   On:  07/30/2020 04:52   ECHOCARDIOGRAM COMPLETE  Result Date: 08/12/2020    ECHOCARDIOGRAM REPORT   Patient Name:   LIANNAH ALTHEIDE Date of Exam: 08/12/2020 Medical Rec #:  [id number]      Height:       62.0 in Accession #:    [id number]     Weight:       157.4 lb Date of Birth:  February 23, 1921     BSA:          1.727 m Patient Age:    70 years       BP:           126/56 mmHg Patient Gender: F              HR:           75 bpm. Exam Location:  Inpatient Procedure: 2D Echo, Cardiac Doppler and Color Doppler Indications:    CHF, CAD  History:        Patient has prior history of Echocardiogram examinations, most                 recent 07/18/2020. at [facility] CHF; CAD.  Sonographer:    Merrie Roof RDCS Referring Phys: [id number] [place]  1. Left ventricular ejection fraction, by estimation, is 30 to 35%. The left ventricle has moderately decreased function. The left ventricle demonstrates global hypokinesis. The left ventricular internal cavity size was mildly dilated. Left ventricular diastolic parameters are consistent with Grade I diastolic dysfunction (impaired relaxation). Elevated left atrial pressure.  2. Right ventricular systolic function is normal. The right ventricular size is normal. There is moderately elevated pulmonary artery systolic pressure. The estimated right ventricular systolic pressure is 123XX123 mmHg.  3. Left atrial size was mild to moderately dilated.  4. The mitral valve is normal in structure. Mild to moderate mitral valve regurgitation.  5. Tricuspid valve regurgitation is mild to moderate.  6. The aortic valve is tricuspid. Aortic valve regurgitation is mild. Mild aortic valve sclerosis is present, with no evidence of aortic valve stenosis.  7. The inferior vena cava is normal in size with greater than 50% respiratory variability, suggesting right atrial pressure of 3 mmHg. Comparison(s): Prior images unable to be directly viewed, comparison made by report only. No significant change  from prior study. FINDINGS  Left Ventricle: Left ventricular ejection fraction, by estimation, is 30 to 35%. The left ventricle has moderately decreased function. The left ventricle demonstrates global hypokinesis. The left ventricular internal cavity size was mildly dilated. There is no left ventricular hypertrophy. Abnormal (paradoxical) septal motion, consistent with left bundle branch block. Left ventricular diastolic parameters are consistent with Grade I diastolic dysfunction (impaired relaxation). Elevated left atrial pressure. Right Ventricle: The right ventricular  size is normal. No increase in right ventricular wall thickness. Right ventricular systolic function is normal. There is moderately elevated pulmonary artery systolic pressure. The tricuspid regurgitant velocity is 3.62 m/s, and with an assumed right atrial pressure of 3 mmHg, the estimated right ventricular systolic pressure is 123XX123 mmHg. Left Atrium: Left atrial size was mild to moderately dilated. Right Atrium: Right atrial size was normal in size. Pericardium: The pericardium was not well visualized. Mitral Valve: The mitral valve is normal in structure. Mild to moderate mitral valve regurgitation. Tricuspid Valve: The tricuspid valve is normal in structure. Tricuspid valve regurgitation is mild to moderate. Aortic Valve: The aortic valve is tricuspid. Aortic valve regurgitation is mild. Mild aortic valve sclerosis is present, with no evidence of aortic valve stenosis. Aortic valve mean gradient measures 4.0 mmHg. Aortic valve peak gradient measures 8.1 mmHg. Aortic valve area, by VTI measures 1.21 cm. Pulmonic Valve: The pulmonic valve was grossly normal. Pulmonic valve regurgitation is trivial. Aorta: The aortic root is normal in size and structure. Venous: The inferior vena cava is normal in size with greater than 50% respiratory variability, suggesting right atrial pressure of 3 mmHg. IAS/Shunts: No atrial level shunt detected by color  flow Doppler.  LEFT VENTRICLE PLAX 2D LVIDd:         5.70 cm      Diastology LVIDs:         4.70 cm      LV e' medial:    3.26 cm/s LV PW:         1.10 cm      LV E/e' medial:  28.3 LV IVS:        1.10 cm      LV e' lateral:   4.68 cm/s LVOT diam:     2.00 cm      LV E/e' lateral: 19.7 LV SV:         38 LV SV Index:   22 LVOT Area:     3.14 cm                              3D Volume EF: LV Volumes (MOD)            3D EF:        29 % LV vol d, MOD A2C: 116.0 ml LV EDV:       141 ml LV vol d, MOD A4C: 95.0 ml  LV ESV:       101 ml LV vol s, MOD A2C: 69.0 ml  LV SV:        40 ml LV vol s, MOD A4C: 83.6 ml LV SV MOD A2C:     47.0 ml LV SV MOD A4C:     95.0 ml LV SV MOD BP:      30.6 ml RIGHT VENTRICLE          IVC RV Basal diam:  3.70 cm  IVC diam: 1.30 cm LEFT ATRIUM             Index       RIGHT ATRIUM           Index LA diam:        3.80 cm 2.20 cm/m  RA Area:     10.80 cm LA Vol (A2C):   69.7 ml 40.37 ml/m RA Volume:   24.50 ml  14.19 ml/m LA Vol (A4C):   45.3 ml 26.24 ml/m LA Biplane  Vol: 56.9 ml 32.95 ml/m  AORTIC VALVE AV Area (Vmax):    1.66 cm AV Area (Vmean):   1.39 cm AV Area (VTI):     1.21 cm AV Vmax:           142.00 cm/s AV Vmean:          97.500 cm/s AV VTI:            0.312 m AV Peak Grad:      8.1 mmHg AV Mean Grad:      4.0 mmHg LVOT Vmax:         75.10 cm/s LVOT Vmean:        43.200 cm/s LVOT VTI:          0.120 m LVOT/AV VTI ratio: 0.38  AORTA Ao Root diam: 2.80 cm MITRAL VALVE                 TRICUSPID VALVE MV Area (PHT): 4.06 cm      TR Peak grad:   52.4 mmHg MV Decel Time: 187 msec      TR Vmax:        362.00 cm/s MR Peak grad:    82.4 mmHg MR Vmax:         454.00 cm/s SHUNTS MR PISA:         1.01 cm    Systemic VTI:  0.12 m MR PISA Eff ROA: 7 mm       Systemic Diam: 2.00 cm MR PISA Radius:  0.40 cm MV E velocity: 92.10 cm/s MV A velocity: 118.00 cm/s MV E/A ratio:  0.78 Mihai Croitoru MD Electronically signed by Sanda Klein MD Signature Date/Time: 08/12/2020/5:26:21 PM    Final     VAS [place] LOWER EXTREMITY VENOUS (DVT)  Result Date: 08/14/2020  Lower Venous DVT Study Indications: Edema.  Limitations: Poor ultrasound/tissue interface. Comparison Study: No previous exams Performing Technologist: Rogelia Rohrer  Examination Guidelines: A complete evaluation includes B-mode imaging, spectral Doppler, color Doppler, and power Doppler as needed of all accessible portions of each vessel. Bilateral testing is considered an integral part of a complete examination. Limited examinations for reoccurring indications may be performed as noted. The reflux portion of the exam is performed with the patient in reverse Trendelenburg.  +---------+---------------+---------+-----------+----------+--------------+ RIGHT    CompressibilityPhasicitySpontaneityPropertiesThrombus Aging +---------+---------------+---------+-----------+----------+--------------+ CFV      Full           Yes      Yes                                 +---------+---------------+---------+-----------+----------+--------------+ SFJ      Full                                                        +---------+---------------+---------+-----------+----------+--------------+ FV Prox  Full           Yes      Yes                                 +---------+---------------+---------+-----------+----------+--------------+ FV Mid   Full           Yes      Yes                                 +---------+---------------+---------+-----------+----------+--------------+  FV DistalFull           Yes      Yes                                 +---------+---------------+---------+-----------+----------+--------------+ PFV      Full                                                        +---------+---------------+---------+-----------+----------+--------------+ POP      Full           Yes      Yes                                 +---------+---------------+---------+-----------+----------+--------------+ PTV       Full                                                        +---------+---------------+---------+-----------+----------+--------------+ PERO     Full                                                        +---------+---------------+---------+-----------+----------+--------------+   +---------+---------------+---------+-----------+----------+-------------------+ LEFT     CompressibilityPhasicitySpontaneityPropertiesThrombus Aging      +---------+---------------+---------+-----------+----------+-------------------+ CFV      Full           Yes      Yes                                      +---------+---------------+---------+-----------+----------+-------------------+ SFJ      Full                                                             +---------+---------------+---------+-----------+----------+-------------------+ FV Prox  Full           Yes      Yes                                      +---------+---------------+---------+-----------+----------+-------------------+ FV Mid   Full           Yes      Yes                                      +---------+---------------+---------+-----------+----------+-------------------+ FV DistalFull           Yes      Yes                                      +---------+---------------+---------+-----------+----------+-------------------+  PFV      Full                                                             +---------+---------------+---------+-----------+----------+-------------------+ POP      Full           Yes      Yes                                      +---------+---------------+---------+-----------+----------+-------------------+ PTV      Full                                         Not well visualized +---------+---------------+---------+-----------+----------+-------------------+   Left Technical Findings: Not visualized segments include Peroneal veins.   Summary: BILATERAL: - No evidence of  deep vein thrombosis seen in the lower extremities, bilaterally. - No evidence of superficial venous thrombosis in the lower extremities, bilaterally. -No evidence of popliteal cyst, bilaterally.   *See table(s) above for measurements and observations. Electronically signed by Ruta Hinds MD on 08/14/2020 at 9:19:04 PM.    Final       Discharge Exam: Vitals:   08/14/20 2036 08/15/20 0518  BP: (!) 113/47 (!) 116/40  Pulse: 81 82  Resp: 14 18  Temp: 97.6 F (36.4 C) 97.9 F (36.6 C)  SpO2:  96%   Vitals:   08/14/20 0900 08/14/20 1219 08/14/20 2036 08/15/20 0518  BP:  (!) 119/51 (!) 113/47 (!) 116/40  Pulse:  79 81 82  Resp:  '20 14 18  '$ Temp:  97.9 F (36.6 C) 97.6 F (36.4 C) 97.9 F (36.6 C)  TempSrc:  Oral Oral Axillary  SpO2: 94% 96%  96%  Weight:        General: Pt is alert, awake, not in acute distress Cardiovascular: RRR, S1/S2 +, no rubs, no gallops Respiratory: CTA bilaterally, no wheezing, no rhonchi Abdominal: Soft, NT, ND, bowel sounds + Extremities: no edema, no cyanosis    The results of significant diagnostics from this hospitalization (including imaging, microbiology, ancillary and laboratory) are listed below for reference.     Microbiology: Recent Results (from the past 240 hour(s))  Resp Panel by RT-PCR (Flu A&B, Covid) Nasopharyngeal Swab     Status: None   Collection Time: 08/11/20  4:42 AM   Specimen: Nasopharyngeal Swab; Nasopharyngeal(NP) swabs in vial transport medium  Result Value Ref Range Status   SARS Coronavirus 2 by RT PCR NEGATIVE NEGATIVE Final    Comment: (NOTE) SARS-CoV-2 target nucleic acids are NOT DETECTED.  The SARS-CoV-2 RNA is generally detectable in upper respiratory specimens during the acute phase of infection. The lowest concentration of SARS-CoV-2 viral copies this assay can detect is 138 copies/mL. A negative result does not preclude SARS-Cov-2 infection and should not be used as the sole basis for treatment or other  patient management decisions. A negative result may occur with  improper specimen collection/handling, submission of specimen other than nasopharyngeal swab, presence of viral mutation(s) within the areas targeted by this assay, and inadequate number of viral copies(<138 copies/mL). A negative result must be combined with clinical observations, patient  history, and epidemiological information. The expected result is Negative.  Fact Sheet for Patients:  EntrepreneurPulse.com.au  Fact Sheet for Healthcare Providers:  IncredibleEmployment.be  This test is no t yet approved or cleared by the [place] FDA and  has been authorized for detection and/or diagnosis of SARS-CoV-2 by FDA under an Emergency Use Authorization (EUA). This EUA will remain  in effect (meaning this test can be used) for the duration of the COVID-19 declaration under Section 564(b)(1) of the Act, 21 U.S.C.section 360bbb-3(b)(1), unless the authorization is terminated  or revoked sooner.       Influenza A by PCR NEGATIVE NEGATIVE Final   Influenza B by PCR NEGATIVE NEGATIVE Final    Comment: (NOTE) The Xpert Xpress SARS-CoV-2/FLU/RSV plus assay is intended as an aid in the diagnosis of influenza from Nasopharyngeal swab specimens and should not be used as a sole basis for treatment. Nasal washings and aspirates are unacceptable for Xpert Xpress SARS-CoV-2/FLU/RSV testing.  Fact Sheet for Patients: EntrepreneurPulse.com.au  Fact Sheet for Healthcare Providers: IncredibleEmployment.be  This test is not yet approved or cleared by the [place] FDA and has been authorized for detection and/or diagnosis of SARS-CoV-2 by FDA under an Emergency Use Authorization (EUA). This EUA will remain in effect (meaning this test can be used) for the duration of the COVID-19 declaration under Section 564(b)(1) of the Act, 21 U.S.C. section  360bbb-3(b)(1), unless the authorization is terminated or revoked.  Performed at [facility], [place] [address] [place], [place] [address]   Culture, blood (routine x 2)     Status: None (Preliminary result)   Collection Time: 08/11/20 10:15 AM   Specimen: BLOOD LEFT HAND  Result Value Ref Range Status   Specimen Description BLOOD LEFT HAND  Final   Special Requests   Final    BOTTLES DRAWN AEROBIC AND ANAEROBIC Blood Culture results may not be optimal due to an inadequate volume of blood received in culture bottles   Culture   Final    NO GROWTH 3 DAYS Performed at [facility], [place] [address] [place], [place] [address]    Report Status PENDING  Incomplete  Culture, blood (routine x 2)     Status: None (Preliminary result)   Collection Time: 08/11/20  3:34 PM   Specimen: BLOOD RIGHT HAND  Result Value Ref Range Status   Specimen Description BLOOD RIGHT HAND  Final   Special Requests   Final    BOTTLES DRAWN AEROBIC ONLY Blood Culture results may not be optimal due to an inadequate volume of blood received in culture bottles   Culture   Final    NO GROWTH 3 DAYS Performed at [facility], [place] [address], [place], [place] [address]    Report Status PENDING  Incomplete  SARS CORONAVIRUS 2 (TAT 6-24 HRS) Nasopharyngeal Nasopharyngeal Swab     Status: None   Collection Time: 08/14/20 12:28 PM   Specimen: Nasopharyngeal Swab  Result Value Ref Range Status   SARS Coronavirus 2 NEGATIVE NEGATIVE Final    Comment: (NOTE) SARS-CoV-2 target nucleic acids are NOT DETECTED.  The SARS-CoV-2 RNA is generally detectable in upper and lower respiratory specimens during the acute phase of infection. Negative results do not preclude SARS-CoV-2 infection, do not rule out co-infections with other pathogens, and should not be used as the sole basis for treatment or other patient management decisions. Negative results must be combined with clinical observations, patient  history, and epidemiological information. The expected  result is Negative.  Fact Sheet for Patients: SugarRoll.be  Fact Sheet for Healthcare Providers: https://www.woods-mathews.com/  This test is not yet approved or cleared by the [place] FDA and  has been authorized for detection and/or diagnosis of SARS-CoV-2 by FDA under an Emergency Use Authorization (EUA). This EUA will remain  in effect (meaning this test can be used) for the duration of the COVID-19 declaration under Se ction 564(b)(1) of the Act, 21 U.S.C. section 360bbb-3(b)(1), unless the authorization is terminated or revoked sooner.  Performed at [facility], [place] [address], [place], [place] [address]      Labs: BNP (last 3 results) Recent Labs    08/01/20 0118 08/02/20 0157 08/11/20 0536  BNP 1,342.3* 1,239.4* [id number]*   Basic Metabolic Panel: Recent Labs  Lab 08/11/20 0536 08/11/20 0617 08/11/20 1015 08/12/20 0131 08/13/20 0116 08/14/20 0207 08/15/20 0053  NA 136  134* 136  --  136 135 135 134*  K 4.8  6.3* 5.1  --  4.9 4.9 4.8 4.2  CL 102  104  --   --  102 99 96* 95*  CO2 19*  --   --  '24 27 26 29  '$ GLUCOSE 285*  304*  --   --  92 98 108* 105*  BUN 24*  41*  --   --  28* 28* 31* 31*  CREATININE 2.30*  2.10*  --   --  2.34* 2.24* 2.09* 2.08*  CALCIUM 10.2  --   --  9.5 9.7 10.0 9.9  MG  --   --  2.4  --  2.2  --   --    Liver Function Tests: Recent Labs  Lab 08/11/20 0536  AST 43*  ALT 26  ALKPHOS 83  BILITOT 1.0  PROT 6.5  ALBUMIN 3.2*   No results for input(s): LIPASE, AMYLASE in the last 168 hours. No results for input(s): AMMONIA in the last 168 hours. CBC: Recent Labs  Lab 08/11/20 0536 08/11/20 0617 08/12/20 0131 08/12/20 1058  WBC 13.3*  --  5.6 5.3  NEUTROABS 4.9  --  3.7 3.9  HGB 13.2  14.3 14.6 10.9* 11.4*  HCT 44.1  42.0 43.0 35.1* 36.8  MCV 95.5  --  92.1 91.8  PLT 160  --  108* 107*   Cardiac  Enzymes: No results for input(s): CKTOTAL, CKMB, CKMBINDEX, TROPONINI in the last 168 hours. BNP: Invalid input(s): POCBNP CBG: Recent Labs  Lab 08/14/20 1558 08/14/20 2033 08/15/20 0022 08/15/20 0332 08/15/20 0716  GLUCAP 121* 135* 106* 108* 104*   D-Dimer No results for input(s): DDIMER in the last 72 hours. Hgb A1c No results for input(s): HGBA1C in the last 72 hours. Lipid Profile Recent Labs    08/14/20 1404  CHOL 153  HDL 56  LDLCALC 77  TRIG 101  CHOLHDL 2.7   Thyroid function studies No results for input(s): TSH, T4TOTAL, T3FREE, THYROIDAB in the last 72 hours.  Invalid input(s): FREET3 Anemia work up No results for input(s): VITAMINB12, FOLATE, FERRITIN, TIBC, IRON, RETICCTPCT in the last 72 hours. Urinalysis    Component Value Date/Time   COLORURINE YELLOW 07/30/2020 0906   APPEARANCEUR CLEAR 07/30/2020 0906   LABSPEC 1.006 07/30/2020 0906   PHURINE 5.0 07/30/2020 0906   GLUCOSEU NEGATIVE 07/30/2020 0906   HGBUR NEGATIVE 07/30/2020 0906   BILIRUBINUR NEGATIVE 07/30/2020 0906   KETONESUR NEGATIVE 07/30/2020 0906   PROTEINUR NEGATIVE 07/30/2020 0906   NITRITE NEGATIVE 07/30/2020 0906   LEUKOCYTESUR NEGATIVE 07/30/2020  0906   Sepsis Labs Invalid input(s): PROCALCITONIN,  WBC,  LACTICIDVEN Microbiology Recent Results (from the past 240 hour(s))  Resp Panel by RT-PCR (Flu A&B, Covid) Nasopharyngeal Swab     Status: None   Collection Time: 08/11/20  4:42 AM   Specimen: Nasopharyngeal Swab; Nasopharyngeal(NP) swabs in vial transport medium  Result Value Ref Range Status   SARS Coronavirus 2 by RT PCR NEGATIVE NEGATIVE Final    Comment: (NOTE) SARS-CoV-2 target nucleic acids are NOT DETECTED.  The SARS-CoV-2 RNA is generally detectable in upper respiratory specimens during the acute phase of infection. The lowest concentration of SARS-CoV-2 viral copies this assay can detect is 138 copies/mL. A negative result does not preclude SARS-Cov-2 infection  and should not be used as the sole basis for treatment or other patient management decisions. A negative result may occur with  improper specimen collection/handling, submission of specimen other than nasopharyngeal swab, presence of viral mutation(s) within the areas targeted by this assay, and inadequate number of viral copies(<138 copies/mL). A negative result must be combined with clinical observations, patient history, and epidemiological information. The expected result is Negative.  Fact Sheet for Patients:  EntrepreneurPulse.com.au  Fact Sheet for Healthcare Providers:  IncredibleEmployment.be  This test is no t yet approved or cleared by the [place] FDA and  has been authorized for detection and/or diagnosis of SARS-CoV-2 by FDA under an Emergency Use Authorization (EUA). This EUA will remain  in effect (meaning this test can be used) for the duration of the COVID-19 declaration under Section 564(b)(1) of the Act, 21 U.S.C.section 360bbb-3(b)(1), unless the authorization is terminated  or revoked sooner.       Influenza A by PCR NEGATIVE NEGATIVE Final   Influenza B by PCR NEGATIVE NEGATIVE Final    Comment: (NOTE) The Xpert Xpress SARS-CoV-2/FLU/RSV plus assay is intended as an aid in the diagnosis of influenza from Nasopharyngeal swab specimens and should not be used as a sole basis for treatment. Nasal washings and aspirates are unacceptable for Xpert Xpress SARS-CoV-2/FLU/RSV testing.  Fact Sheet for Patients: EntrepreneurPulse.com.au  Fact Sheet for Healthcare Providers: IncredibleEmployment.be  This test is not yet approved or cleared by the [place] FDA and has been authorized for detection and/or diagnosis of SARS-CoV-2 by FDA under an Emergency Use Authorization (EUA). This EUA will remain in effect (meaning this test can be used) for the duration of the COVID-19 declaration  under Section 564(b)(1) of the Act, 21 U.S.C. section 360bbb-3(b)(1), unless the authorization is terminated or revoked.  Performed at [facility], [place] [address], [place], [place] [address]   Culture, blood (routine x 2)     Status: None (Preliminary result)   Collection Time: 08/11/20 10:15 AM   Specimen: BLOOD LEFT HAND  Result Value Ref Range Status   Specimen Description BLOOD LEFT HAND  Final   Special Requests   Final    BOTTLES DRAWN AEROBIC AND ANAEROBIC Blood Culture results may not be optimal due to an inadequate volume of blood received in culture bottles   Culture   Final    NO GROWTH 3 DAYS Performed at [facility], [place] [address], [place], [place] [address]    Report Status PENDING  Incomplete  Culture, blood (routine x 2)     Status: None (Preliminary result)   Collection Time: 08/11/20  3:34 PM   Specimen: BLOOD RIGHT HAND  Result Value Ref Range Status   Specimen Description BLOOD RIGHT HAND  Final  Special Requests   Final    BOTTLES DRAWN AEROBIC ONLY Blood Culture results may not be optimal due to an inadequate volume of blood received in culture bottles   Culture   Final    NO GROWTH 3 DAYS Performed at [facility], [place] [address] [place], [place] [address]    Report Status PENDING  Incomplete  SARS CORONAVIRUS 2 (TAT 6-24 HRS) Nasopharyngeal Nasopharyngeal Swab     Status: None   Collection Time: 08/14/20 12:28 PM   Specimen: Nasopharyngeal Swab  Result Value Ref Range Status   SARS Coronavirus 2 NEGATIVE NEGATIVE Final    Comment: (NOTE) SARS-CoV-2 target nucleic acids are NOT DETECTED.  The SARS-CoV-2 RNA is generally detectable in upper and lower respiratory specimens during the acute phase of infection. Negative results do not preclude SARS-CoV-2 infection, do not rule out co-infections with other pathogens, and should not be used as the sole basis for treatment or other patient management decisions. Negative results must  be combined with clinical observations, patient history, and epidemiological information. The expected result is Negative.  Fact Sheet for Patients: SugarRoll.be  Fact Sheet for Healthcare Providers: https://www.woods-mathews.com/  This test is not yet approved or cleared by the [place] FDA and  has been authorized for detection and/or diagnosis of SARS-CoV-2 by FDA under an Emergency Use Authorization (EUA). This EUA will remain  in effect (meaning this test can be used) for the duration of the COVID-19 declaration under Se ction 564(b)(1) of the Act, 21 U.S.C. section 360bbb-3(b)(1), unless the authorization is terminated or revoked sooner.  Performed at [facility], [place] [address] [place], [place] [address]      Time coordinating discharge: Over 30 minutes  SIGNED:   Darliss Cheney, MD  [facility] 08/15/2020, 10:27 AM  If 7PM-7AM, please contact night-coverage [email or web address]

## 2020-08-15 NOTE — TOC Transition Note (Signed)
Transition of Care [facility]) - CM/SW Discharge Note   Patient Details  Name: Kristin Adkins MRN: [id number] Date of Birth: 1920-10-17  Transition of [facility]) CM/SW Contact:  Joanne Chars, LCSW Phone Number: 08/15/2020, 11:17 AM   Clinical Narrative:   Pt discharging [facility], room 206.  RN call report Adkins [phone].     Final next level of care: Skilled Nursing Facility Barriers Adkins Discharge: Barriers Resolved   Patient Goals and CMS Choice   CMS Medicare.gov Compare Post Acute Care list provided Adkins::  (Pt is from [place] and son would like pt Ad[facility] return.)    Discharge Placement              Patient chooses bed at: [facility] [place] Patient Adkins be transferred Adkins facility by: [place] Name of family member notified: son [name] Patient and family notified of of transfer: 08/15/20  Discharge Plan and Services In-house Referral: Clinical Social Work   Post Acute Care Choice: [place]                               Social Determinants of Health (SDOH) Interventions     Readmission Risk Interventions No flowsheet data found.

## 2020-08-15 NOTE — Progress Notes (Signed)
Patient 96% on room air at rest. Walk test completed. Patient able to ambulate with decrease in oxygen saturation, while on room air.

## 2020-08-16 LAB — CULTURE, BLOOD (ROUTINE X 2)
Culture: NO GROWTH
Culture: NO GROWTH

## 2020-08-23 NOTE — Progress Notes (Signed)
Cardiology Office Note   Date:  08/25/2020   ID:  Kristin Adkins, DOB May 07, 1921, MRN [id number]  PCP:  Raelene Bott, MD  Cardiologist: Dr. Acie Fredrickson, MD   Chief Complaint  Patient presents with  . Follow-up  . Hospitalization Follow-up   History of Present Illness: Kristin Adkins is a 87 y.o. female who presents for hospital follow-up>>previously followed with Dr. Dwyane Dee at [facility] but wishes to now follow with [facility].  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  She previously followed with Dr. Dwyane Dee for her cardiac care. She has a history of presumed ischemic cardiomyopathy given abnormal nuclear stress test in 2013 with chronic combined CHF per notes in Care Everywhere. It does not appear that she has ever had a cardiac catheterization.   She has had multiple admissions over the last couple of months for the treatement on CHF, Covid PNA and AF with RVR. More recently she was noted to have marked elevated HsT with ST depressions on EKG felt to be due to supply/demand mismatch rather than acute plaque rupture. Cardiac catheterization was not pursued due to advanced age and CKD.   She was recently admitted at [facility] from 07/30/2020 to 08/02/2020 for acute hypoxic respiratory failure due to chronic combined CHF>>found to be markedly fluid volume overloaded on admission and was diuresed with IV Lasix. She was discharged on Torsemide '20mg'$  daily, Amiodarone '100mg'$  daily, Eliquis 2.'5mg'$  twice daily, and Hydralazine '10mg'$  three times daily.  She re-presented and was then seen in hospital consultation 08/11/20 for acute respiratory distress and AMS. She was found to be  hypoxic on presentation requiring BiPAP. EKG showed sinus tachycardia, rate 123 bpm, with known LBBB anger her BNP was markedly elevated at 1,300 with CXR consistent with CHF. HsT was 24 >> 196. She was treated with IV Lasix and was admitted with cardiology consultation.   Today she presents with her son from [facility]. She look great considering the above. She is very HOH but I am able to acknowledge that she feels great with no SOB or chest pain. Son says she has been walking with PT and is doing great>>walking 240f unassisted earlier in the day. She continues to do many of her ADL/IADLs on her own despite her advan. ced age. She was discharged back on torsemide '20mg'$  PO QD which I anticipate that we will continue however will need to follow her renal function closely. We discussed plans to have [facility] weigh her daily which they have not been doing. The son mentions that he was approached by palliative medicine during her hospitalization however due to her baseline good functional status, they are not considering that at this time. She is a full DNR.   Past Medical History:  Diagnosis Date  . AF (atrial fibrillation) ([facility]   . CHF (congestive heart failure) ([facility]   . CKD (chronic kidney disease), stage IV ([facility]   . Coronary artery disease   . Hypertension   . Myocardial infarction ([facility]   . Respiratory failure ([facility]     Past Surgical History:  Procedure Laterality Date  . 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 acetaminophen (TYLENOL) 500 MG tablet Take 500 mg by mouth every 6 (six) hours as needed for fever or mild pain (discomfort).    Marland Kitchen albuterol (PROVENTIL) (2.5 MG/3ML) 0.083% nebulizer solution Take 3 mLs (2.5 mg total) by nebulization every 6 (six) hours as needed for shortness of breath or wheezing. 75 mL 12  . amiodarone (PACERONE) 100 MG tablet Take 100 mg by  mouth daily.    Marland Kitchen apixaban (ELIQUIS) 2.5 MG TABS tablet Take 1 tablet (2.5 mg total) by mouth 2 (two) times daily. 60 tablet   . famotidine (PEPCID) 10 MG tablet Take 1 tablet (10 mg total) by mouth daily. 30 tablet 1  . hydrALAZINE (APRESOLINE) 10 MG tablet Take 1 tablet (10 mg total) by mouth every 8 (eight) hours.    . magnesium oxide (MAG-OX) 400 MG tablet Take 400 mg by mouth daily.    . melatonin 3 MG TABS tablet Take 6 mg by mouth at bedtime.    . Multiple Vitamins-Minerals (MULTIVITAMIN WITH MINERALS) tablet Take 1 tablet by mouth daily.    . potassium chloride SA (KLOR-CON) 20 MEQ tablet Take 1 tablet (20 mEq total) by mouth daily.    Marland Kitchen torsemide (DEMADEX) 20 MG tablet Take 1 tablet (20 mg total) by mouth daily.     No current facility-administered medications for this visit.    Allergies:   Codeine, Nitrofurantoin, and Penicillins    Social History:  The patient  reports that she has never smoked. She has never used smokeless tobacco. She reports that she does not drink alcohol.   Family History:  The patient's family history is not on file.    ROS:  Please see the history of present illness.   Otherwise, review of systems are positive for none.    All other systems are reviewed and negative.    PHYSICAL EXAM: VS:  BP (!) 108/52   Pulse 78   Ht '5\' 2"'$  (1.575 m)   Wt 151 lb (68.5 kg)   SpO2 96%   BMI 27.62 kg/m  , BMI Body mass index is 27.62 kg/m.   General: Elderly, NAD Neck: Negative for carotid bruits. No JVD Lungs:Clear to ausculation bilaterally. No wheezes, rales, or rhonchi. Breathing is unlabored. Cardiovascular: RRR with S1 S2. No murmurs Extremities: No edema. Neuro: Alert and oriented. HOH Psych: Responds to questions appropriately with normal affect.    EKG:  EKG is not ordered today.  Recent Labs: 08/11/2020: ALT 26; B Natriuretic Peptide 1,300.9 08/12/2020: Hemoglobin 11.4; Platelets 107 08/13/2020: Magnesium 2.2 08/24/2020: BUN 24; Creatinine, Ser  1.94; Potassium 4.5; Sodium 139    Lipid Panel    Component Value Date/Time   CHOL 153 08/14/2020 1404   TRIG 101 08/14/2020 1404   HDL 56 08/14/2020 1404   CHOLHDL 2.7 08/14/2020 1404   VLDL 20 08/14/2020 1404   LDLCALC 77 08/14/2020 1404     Wt Readings from Last 3 Encounters:  08/24/20 151 lb (68.5 kg)  08/14/20 147 lb 4.3 oz (66.8 kg)  08/02/20 158 lb 1.1 oz (71.7 kg)    Other studies Reviewed: Additional studies/ records that were reviewed today include:  Review of the above records demonstrates:  Echocardiogram 07/18/2020: Summary  1. The left ventricle is mildly dilated in size with normal wall thickness.  2. The left ventricular systolic function ismoderately to severely  decreased, LVEF is visually estimated at 30-35%.  3. The inferior and inferolateral segments are akinetic (similar to prior).  4. There is grade II diastolic dysfunction (elevated filling pressure).  5. The right ventricle is normal in size, with normal systolic function.  6. The left atrium is moderately dilated in size.  7. The right atrium is mildly dilated in size.  8. There is moderate mitral valve regurgitation.  9. There is mild tricuspid regurgitation.  10. At least moderate pulmonary hypertension with estimated PA systolic pressure [id number] mmHg.   ASSESSMENT AND PLAN:  1. Chronic combined systolic CHF: -Multiple hospital admissions with pulmonary edema and volume excess treated with IV Lasix and transitioned back to PTA torsemide at discharge. -Appears euvolemic today -Will obtain BMET as she has solitary kidney with renal function elevated above her baseline of 1.5-1.7 at discharge>>last cr at 2.08 on 08/15/20 -Continue current dose of torsemide with K+ supplementation  -Echocardiogram showed ejection fraction 30 to 35%.  2. Presumed ischemic cardiomyopathy: -No anginal symptoms -Unable to add GDMT due to soft BP and renal funciton  -Consider hydralazine, nitrates  if BP allows  3. CKD Stage III with solitary kidney: -Cr on discharge, 2.08>>>BMET today and follow closely  4. PAF: -NSR today>>contionue current dose of amiodarone and reduced dose Eliquis for AC. No palpitations and no reports of falls     Current medicines are reviewed at length with the patient today.  The patient does not have concerns regarding medicines.  The following changes have been made: no change   Labs/ tests ordered today include: BMET  Orders Placed This Encounter  Procedures  . Basic metabolic panel    Disposition:   FU with myself or Dr. Acie Fredrickson in 4 weeks  Signed, Kathyrn Drown, NP  08/25/2020 [address], [place], [place]  [address] Phone: [phone]; Fax: [phone]

## 2020-08-24 ENCOUNTER — Other Ambulatory Visit: Payer: Self-pay

## 2020-08-24 ENCOUNTER — Encounter: Payer: Self-pay | Admitting: Cardiology

## 2020-08-24 ENCOUNTER — Ambulatory Visit: Payer: Medicare Other | Admitting: Cardiology

## 2020-08-24 VITALS — BP 108/52 | HR 78 | Ht 62.0 in | Wt 151.0 lb

## 2020-08-24 DIAGNOSIS — I255 Ischemic cardiomyopathy: Secondary | ICD-10-CM

## 2020-08-24 DIAGNOSIS — I959 Hypotension, unspecified: Secondary | ICD-10-CM

## 2020-08-24 DIAGNOSIS — I480 Paroxysmal atrial fibrillation: Secondary | ICD-10-CM

## 2020-08-24 DIAGNOSIS — I5042 Chronic combined systolic (congestive) and diastolic (congestive) heart failure: Secondary | ICD-10-CM | POA: Diagnosis not present

## 2020-08-24 DIAGNOSIS — I5043 Acute on chronic combined systolic (congestive) and diastolic (congestive) heart failure: Secondary | ICD-10-CM | POA: Diagnosis not present

## 2020-08-24 NOTE — Patient Instructions (Signed)
Medication Instructions:  Your physician recommends that you continue on your current medications as directed. Please refer to the Current Medication list given to you today.  *If you need a refill on your cardiac medications before your next appointment, please call your pharmacy*   Lab Work: TODAY: BMET If you have labs (blood work) drawn today and your tests are completely normal, you will receive your results only by: Marland Kitchen MyChart Message (if you have MyChart) OR . A paper copy in the mail If you have any lab test that is abnormal or we need to change your treatment, we will call you to review the results.   Testing/Procedures: NONE   Follow-Up: At [facility], you and your health needs are our priority.  As part of our continuing mission to provide you with exceptional heart care, we have created designated Provider Care Teams.  These Care Teams include your primary Cardiologist (physician) and Advanced Practice Providers (APPs -  Physician Assistants and Nurse Practitioners) who all work together to provide you with the care you need, when you need it.  We recommend signing up for the patient portal called "MyChart".  Sign up information is provided on this After Visit Summary.  MyChart is used to connect with patients for Virtual Visits (Telemedicine).  Patients are able to view lab/test results, encounter notes, upcoming appointments, etc.  Non-urgent messages can be sent to your provider as well.   To learn more about what you can do with MyChart, go to [email or web address].    Your next appointment:   May 9th at 3:15 pm   The format for your next appointment:   In Person  Provider:   Kathyrn Drown, NP

## 2020-08-25 ENCOUNTER — Encounter: Payer: Self-pay | Admitting: Cardiology

## 2020-08-25 LAB — BASIC METABOLIC PANEL
BUN/Creatinine Ratio: 12 (ref 12–28)
BUN: 24 mg/dL (ref 10–36)
CO2: 20 mmol/L (ref 20–29)
Calcium: 9.6 mg/dL (ref 8.7–10.3)
Chloride: 102 mmol/L (ref 96–106)
Creatinine, Ser: 1.94 mg/dL — ABNORMAL HIGH (ref 0.57–1.00)
Glucose: 136 mg/dL — ABNORMAL HIGH (ref 65–99)
Potassium: 4.5 mmol/L (ref 3.5–5.2)
Sodium: 139 mmol/L (ref 134–144)
eGFR: 23 mL/min/{1.73_m2} — ABNORMAL LOW (ref >59)

## 2020-08-27 ENCOUNTER — Inpatient Hospital Stay (HOSPITAL_COMMUNITY)
Admission: EM | Admit: 2020-08-27 | Discharge: 2020-09-05 | DRG: 208 | Disposition: A | Discharge status: Skilled Nursing Facility | Payer: Medicare Other | Attending: Internal Medicine | Admitting: Internal Medicine

## 2020-08-27 ENCOUNTER — Encounter (HOSPITAL_COMMUNITY): Payer: Self-pay | Admitting: Emergency Medicine

## 2020-08-27 ENCOUNTER — Emergency Department (HOSPITAL_COMMUNITY): Payer: Medicare Other

## 2020-08-27 ENCOUNTER — Other Ambulatory Visit: Payer: Self-pay

## 2020-08-27 DIAGNOSIS — I480 Paroxysmal atrial fibrillation: Secondary | ICD-10-CM | POA: Diagnosis present

## 2020-08-27 DIAGNOSIS — Z905 Acquired absence of kidney: Secondary | ICD-10-CM | POA: Diagnosis not present

## 2020-08-27 DIAGNOSIS — Z789 Other specified health status: Secondary | ICD-10-CM

## 2020-08-27 DIAGNOSIS — L89891 Pressure ulcer of other site, stage 1: Secondary | ICD-10-CM | POA: Diagnosis present

## 2020-08-27 DIAGNOSIS — F0390 Unspecified dementia without behavioral disturbance: Secondary | ICD-10-CM | POA: Diagnosis present

## 2020-08-27 DIAGNOSIS — I447 Left bundle-branch block, unspecified: Secondary | ICD-10-CM | POA: Diagnosis present

## 2020-08-27 DIAGNOSIS — Z20822 Contact with and (suspected) exposure to covid-19: Secondary | ICD-10-CM | POA: Diagnosis present

## 2020-08-27 DIAGNOSIS — Z8701 Personal history of pneumonia (recurrent): Secondary | ICD-10-CM

## 2020-08-27 DIAGNOSIS — I2510 Atherosclerotic heart disease of native coronary artery without angina pectoris: Secondary | ICD-10-CM | POA: Diagnosis present

## 2020-08-27 DIAGNOSIS — J9602 Acute respiratory failure with hypercapnia: Secondary | ICD-10-CM | POA: Diagnosis present

## 2020-08-27 DIAGNOSIS — Z7189 Other specified counseling: Secondary | ICD-10-CM

## 2020-08-27 DIAGNOSIS — L89151 Pressure ulcer of sacral region, stage 1: Secondary | ICD-10-CM | POA: Diagnosis present

## 2020-08-27 DIAGNOSIS — Z66 Do not resuscitate: Secondary | ICD-10-CM | POA: Diagnosis present

## 2020-08-27 DIAGNOSIS — N184 Chronic kidney disease, stage 4 (severe): Secondary | ICD-10-CM | POA: Diagnosis present

## 2020-08-27 DIAGNOSIS — R531 Weakness: Secondary | ICD-10-CM

## 2020-08-27 DIAGNOSIS — Z8616 Personal history of COVID-19: Secondary | ICD-10-CM

## 2020-08-27 DIAGNOSIS — Z7901 Long term (current) use of anticoagulants: Secondary | ICD-10-CM

## 2020-08-27 DIAGNOSIS — I2721 Secondary pulmonary arterial hypertension: Secondary | ICD-10-CM | POA: Diagnosis present

## 2020-08-27 DIAGNOSIS — Z79899 Other long term (current) drug therapy: Secondary | ICD-10-CM

## 2020-08-27 DIAGNOSIS — E1122 Type 2 diabetes mellitus with diabetic chronic kidney disease: Secondary | ICD-10-CM | POA: Diagnosis present

## 2020-08-27 DIAGNOSIS — N189 Chronic kidney disease, unspecified: Secondary | ICD-10-CM | POA: Diagnosis not present

## 2020-08-27 DIAGNOSIS — J9601 Acute respiratory failure with hypoxia: Secondary | ICD-10-CM | POA: Diagnosis present

## 2020-08-27 DIAGNOSIS — L89611 Pressure ulcer of right heel, stage 1: Secondary | ICD-10-CM | POA: Diagnosis present

## 2020-08-27 DIAGNOSIS — I252 Old myocardial infarction: Secondary | ICD-10-CM | POA: Diagnosis not present

## 2020-08-27 DIAGNOSIS — J449 Chronic obstructive pulmonary disease, unspecified: Secondary | ICD-10-CM | POA: Diagnosis present

## 2020-08-27 DIAGNOSIS — Z515 Encounter for palliative care: Secondary | ICD-10-CM | POA: Diagnosis not present

## 2020-08-27 DIAGNOSIS — E119 Type 2 diabetes mellitus without complications: Secondary | ICD-10-CM

## 2020-08-27 DIAGNOSIS — I130 Hypertensive heart and chronic kidney disease with heart failure and stage 1 through stage 4 chronic kidney disease, or unspecified chronic kidney disease: Secondary | ICD-10-CM | POA: Diagnosis present

## 2020-08-27 DIAGNOSIS — R0603 Acute respiratory distress: Secondary | ICD-10-CM

## 2020-08-27 DIAGNOSIS — N179 Acute kidney failure, unspecified: Secondary | ICD-10-CM | POA: Diagnosis not present

## 2020-08-27 DIAGNOSIS — R54 Age-related physical debility: Secondary | ICD-10-CM

## 2020-08-27 DIAGNOSIS — E1165 Type 2 diabetes mellitus with hyperglycemia: Secondary | ICD-10-CM | POA: Diagnosis present

## 2020-08-27 DIAGNOSIS — I959 Hypotension, unspecified: Secondary | ICD-10-CM | POA: Diagnosis present

## 2020-08-27 DIAGNOSIS — L8990 Pressure ulcer of unspecified site, unspecified stage: Secondary | ICD-10-CM | POA: Diagnosis present

## 2020-08-27 DIAGNOSIS — R5381 Other malaise: Secondary | ICD-10-CM | POA: Diagnosis not present

## 2020-08-27 DIAGNOSIS — I5043 Acute on chronic combined systolic (congestive) and diastolic (congestive) heart failure: Secondary | ICD-10-CM | POA: Diagnosis present

## 2020-08-27 HISTORY — DX: Chronic obstructive pulmonary disease, unspecified: J44.9

## 2020-08-27 LAB — I-STAT VENOUS BLOOD GAS, ED
Acid-base deficit: 9 mmol/L — ABNORMAL HIGH (ref 0.0–2.0)
Bicarbonate: 20.2 mmol/L (ref 20.0–28.0)
Calcium, Ion: 1.22 mmol/L (ref 1.15–1.40)
HCT: 40 % (ref 36.0–46.0)
Hemoglobin: 13.6 g/dL (ref 12.0–15.0)
O2 Saturation: 91 %
Potassium: 5.1 mmol/L (ref 3.5–5.1)
Sodium: 134 mmol/L — ABNORMAL LOW (ref 135–145)
TCO2: 22 mmol/L (ref 22–32)
pCO2, Ven: 57.3 mmHg (ref 44.0–60.0)
pH, Ven: 7.156 — CL (ref 7.250–7.430)
pO2, Ven: 80 mmHg — ABNORMAL HIGH (ref 32.0–45.0)

## 2020-08-27 LAB — CBC
HCT: 42.9 % (ref 36.0–46.0)
Hemoglobin: 12.9 g/dL (ref 12.0–15.0)
MCH: 27.6 pg (ref 26.0–34.0)
MCHC: 30.1 g/dL (ref 30.0–36.0)
MCV: 91.9 fL (ref 80.0–100.0)
Platelets: 202 10*3/uL (ref 150–400)
RBC: 4.67 MIL/uL (ref 3.87–5.11)
RDW: 14 % (ref 11.5–15.5)
WBC: 15.4 10*3/uL — ABNORMAL HIGH (ref 4.0–10.5)
nRBC: 0 % (ref 0.0–0.2)

## 2020-08-27 LAB — COMPREHENSIVE METABOLIC PANEL
ALT: 60 U/L — ABNORMAL HIGH (ref 0–44)
AST: 93 U/L — ABNORMAL HIGH (ref 15–41)
Albumin: 3.2 g/dL — ABNORMAL LOW (ref 3.5–5.0)
Alkaline Phosphatase: 100 U/L (ref 38–126)
Anion gap: 13 (ref 5–15)
BUN: 25 mg/dL — ABNORMAL HIGH (ref 8–23)
CO2: 19 mmol/L — ABNORMAL LOW (ref 22–32)
Calcium: 9.6 mg/dL (ref 8.9–10.3)
Chloride: 100 mmol/L (ref 98–111)
Creatinine, Ser: 2.39 mg/dL — ABNORMAL HIGH (ref 0.44–1.00)
GFR, Estimated: 18 mL/min — ABNORMAL LOW (ref >60)
Glucose, Bld: 300 mg/dL — ABNORMAL HIGH (ref 70–99)
Potassium: 5 mmol/L (ref 3.5–5.1)
Sodium: 132 mmol/L — ABNORMAL LOW (ref 135–145)
Total Bilirubin: 0.8 mg/dL (ref 0.3–1.2)
Total Protein: 6.8 g/dL (ref 6.5–8.1)

## 2020-08-27 LAB — BLOOD GAS, ARTERIAL
Acid-base deficit: 3.8 mmol/L — ABNORMAL HIGH (ref 0.0–2.0)
Bicarbonate: 20.9 mmol/L (ref 20.0–28.0)
Drawn by: 164
FIO2: 44
O2 Saturation: 91.8 %
Patient temperature: 36.6
pCO2 arterial: 38.3 mmHg (ref 32.0–48.0)
pH, Arterial: 7.355 (ref 7.350–7.450)
pO2, Arterial: 62.4 mmHg — ABNORMAL LOW (ref 83.0–108.0)

## 2020-08-27 LAB — GLUCOSE, CAPILLARY
Glucose-Capillary: 97 mg/dL (ref 70–99)
Glucose-Capillary: 105 mg/dL — ABNORMAL HIGH (ref 70–99)

## 2020-08-27 LAB — BLOOD GAS, VENOUS
Acid-base deficit: 1.7 mmol/L (ref 0.0–2.0)
Bicarbonate: 24 mmol/L (ref 20.0–28.0)
FIO2: 80
O2 Saturation: 34.3 %
Patient temperature: 37
pCO2, Ven: 51.6 mmHg (ref 44.0–60.0)
pH, Ven: 7.289 (ref 7.250–7.430)
pO2, Ven: <31 mmHg — CL (ref 32.0–45.0)

## 2020-08-27 LAB — RESP PANEL BY RT-PCR (FLU A&B, COVID) ARPGX2
Influenza A by PCR: NEGATIVE
Influenza B by PCR: NEGATIVE
SARS Coronavirus 2 by RT PCR: NEGATIVE

## 2020-08-27 LAB — BRAIN NATRIURETIC PEPTIDE: B Natriuretic Peptide: 1383.8 pg/mL — ABNORMAL HIGH (ref 0.0–100.0)

## 2020-08-27 MED ORDER — SODIUM CHLORIDE 0.9% FLUSH
3.0000 mL | Freq: Two times a day (BID) | INTRAVENOUS | Status: DC
  Administered 2020-08-27 – 2020-09-05 (×13): 3 mL via INTRAVENOUS

## 2020-08-27 MED ORDER — INSULIN ASPART 100 UNIT/ML ~~LOC~~ SOLN
0.0000 [IU] | Freq: Three times a day (TID) | SUBCUTANEOUS | Status: DC
  Administered 2020-08-31 – 2020-09-02 (×3): 1 [IU] via SUBCUTANEOUS

## 2020-08-27 MED ORDER — FAMOTIDINE 20 MG PO TABS
10.0000 mg | ORAL_TABLET | Freq: Every day | ORAL | Status: DC
  Administered 2020-08-27 – 2020-09-05 (×10): 10 mg via ORAL
  Filled 2020-08-27 (×10): qty 1

## 2020-08-27 MED ORDER — SODIUM CHLORIDE 0.9 % IV SOLN: 250.0000 mL | INTRAVENOUS | Status: DC | PRN

## 2020-08-27 MED ORDER — LORAZEPAM 2 MG/ML IJ SOLN
INTRAMUSCULAR | Status: AC
  Administered 2020-08-27: 0.5 mg via INTRAVENOUS
  Filled 2020-08-27: qty 1

## 2020-08-27 MED ORDER — POTASSIUM CHLORIDE CRYS ER 20 MEQ PO TBCR
20.0000 meq | EXTENDED_RELEASE_TABLET | Freq: Every day | ORAL | Status: DC
  Administered 2020-08-27: 20 meq via ORAL
  Filled 2020-08-27: qty 1

## 2020-08-27 MED ORDER — MAGNESIUM OXIDE 400 (241.3 MG) MG PO TABS
400.0000 mg | ORAL_TABLET | Freq: Every day | ORAL | Status: DC
  Administered 2020-08-27 – 2020-08-28 (×2): 400 mg via ORAL
  Filled 2020-08-27 (×4): qty 1

## 2020-08-27 MED ORDER — FUROSEMIDE 10 MG/ML IJ SOLN
80.0000 mg | Freq: Every day | INTRAMUSCULAR | Status: DC
  Administered 2020-08-27 – 2020-08-29 (×3): 80 mg via INTRAVENOUS
  Filled 2020-08-27 (×3): qty 8

## 2020-08-27 MED ORDER — NITROGLYCERIN IN D5W 200-5 MCG/ML-% IV SOLN
0.0000 ug/min | INTRAVENOUS | Status: DC
  Administered 2020-08-27: 5 ug/min via INTRAVENOUS
  Filled 2020-08-27: qty 250

## 2020-08-27 MED ORDER — FUROSEMIDE 10 MG/ML IJ SOLN
80.0000 mg | Freq: Once | INTRAMUSCULAR | Status: AC
  Administered 2020-08-27: 80 mg via INTRAVENOUS
  Filled 2020-08-27: qty 8

## 2020-08-27 MED ORDER — ALBUTEROL SULFATE (2.5 MG/3ML) 0.083% IN NEBU
2.5000 mg | INHALATION_SOLUTION | Freq: Four times a day (QID) | RESPIRATORY_TRACT | Status: DC | PRN
  Administered 2020-09-04: 2.5 mg via RESPIRATORY_TRACT
  Filled 2020-08-27: qty 3

## 2020-08-27 MED ORDER — APIXABAN 2.5 MG PO TABS
2.5000 mg | ORAL_TABLET | Freq: Two times a day (BID) | ORAL | Status: DC
  Administered 2020-08-27 – 2020-09-05 (×19): 2.5 mg via ORAL
  Filled 2020-08-27 (×21): qty 1

## 2020-08-27 MED ORDER — AMIODARONE HCL 200 MG PO TABS
100.0000 mg | ORAL_TABLET | Freq: Every day | ORAL | Status: DC
  Administered 2020-08-27 – 2020-09-05 (×10): 100 mg via ORAL
  Filled 2020-08-27 (×10): qty 1

## 2020-08-27 MED ORDER — LORAZEPAM 2 MG/ML IJ SOLN
0.5000 mg | Freq: Once | INTRAMUSCULAR | Status: DC
  Filled 2020-08-27: qty 1

## 2020-08-27 MED ORDER — ACETAMINOPHEN 325 MG PO TABS
650.0000 mg | ORAL_TABLET | ORAL | Status: DC | PRN
  Administered 2020-08-27 – 2020-09-04 (×2): 650 mg via ORAL
  Filled 2020-08-27 (×2): qty 2

## 2020-08-27 MED ORDER — LORAZEPAM 2 MG/ML IJ SOLN: 0.5000 mg | Freq: Once | INTRAMUSCULAR | Status: AC

## 2020-08-27 MED ORDER — SODIUM CHLORIDE 0.9% FLUSH: 3.0000 mL | INTRAVENOUS | Status: DC | PRN

## 2020-08-27 MED ORDER — SODIUM CHLORIDE 0.9 % IV BOLUS
100.0000 mL | Freq: Once | INTRAVENOUS | Status: AC
  Administered 2020-08-27: 100 mL via INTRAVENOUS

## 2020-08-27 NOTE — Progress Notes (Signed)
Patient transported from ED to 4E02 on bipap. RN at bedside.

## 2020-08-27 NOTE — ED Provider Notes (Signed)
[facility] EMERGENCY DEPARTMENT Provider Note   CSN: [id number] Arrival date & time: 08/27/20  [id number]     History No chief complaint on file.   Kristin Adkins is a 87 y.o. female.  HPI Level 5 caveat secondary to patient unable to verbalize due to acuity of condition and respiratory distress Initial history obtained via EMS They report patient comes from skilled nursing facility. Report patient was found in respiratory distress this morning.  On EMS arrival, sats were in the 80s patient was in respiratory distress with increased work of breathing and rapid rate.  Systolic blood pressure was initially 140.  Kristin Adkins received nitroglycerin sublingual in route and blood pressure decreased and a  fluid bolus was started   87 year old female with significant past medical history of hypertension, congestive heart failure, A. fib, on chronic anticoagulation, CKD stage IV, Covid pneumonia January 2022, DNR from nursing facility presents with respiratory distress patient is unable to give any further history.  Kristin Adkins is on nonrebreather and keeps pulling it off and saying Kristin Adkins is short of breath Past Medical History:  Diagnosis Date  . AF (atrial fibrillation) ([place])   . CHF (congestive heart failure) ([place])   . CKD (chronic kidney disease), stage IV ([place])   . Coronary artery disease   . Hypertension   . Myocardial infarction ([place])   . Respiratory failure [facility])     Patient Active Problem List   Diagnosis Date Noted  . NSTEMI (non-ST elevated myocardial infarction) ([place]) 08/15/2020  . Pressure injury of skin 08/12/2020  . SIRS (systemic inflammatory response syndrome) ([place]) 08/11/2020  . Acute kidney injury superimposed on chronic kidney disease ([place]) 08/11/2020  . Acute on chronic combined systolic and diastolic CHF (congestive heart failure) ([place]) 07/30/2020  . DNR (do not resuscitate) 07/30/2020  . Acute on chronic respiratory failure with hypoxia and hypercapnia ([place])  07/30/2020  . AF (paroxysmal atrial fibrillation) ([place]) 07/30/2020  . Chronic anticoagulation 07/30/2020  . History of COVID-19 07/30/2020  . Transient hypotension 07/30/2020  . Type 2 diabetes mellitus without complication ([place]) [id number]  . Prolonged QT interval 07/30/2020  . Acute lower UTI 07/30/2020  . Respiratory failure [facility])     Past Surgical History:  Procedure Laterality Date  . 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     No family history on file.  Social History   Tobacco Use  . Smoking status: Never Smoker  . Smokeless tobacco: Never Used  Substance Use Topics  . Alcohol use: Never    Home Medications Prior to Admission medications   Medication Sig Start Date End Date Taking? Authorizing Provider  acetaminophen (TYLENOL) 500 MG tablet Take 500 mg by mouth every 6 (six) hours as needed for fever or mild pain (discomfort).    [provider]  albuterol (PROVENTIL) (2.5 MG/3ML) 0.083% nebulizer solution Take 3 mLs (2.5 mg total) by nebulization every 6 (six) hours as needed for shortness of breath or wheezing. 08/02/20   Geradine Girt, DO  amiodarone (PACERONE) 100 MG tablet Take 100 mg by mouth daily. 07/19/20 08/01/21  [provider]  apixaban (ELIQUIS) 2.5 MG TABS tablet Take 1 tablet (2.5 mg total) by mouth 2 (two) times daily. 08/02/20   Geradine Girt, DO  famotidine (PEPCID) 10 MG tablet Take 1 tablet (10 mg total) by mouth daily. 08/02/20 08/02/21  Eulogio Bear U, DO  hydrALAZINE (APRESOLINE) 10 MG tablet Take 1 tablet (10 mg total) by mouth every 8 (eight) hours. 08/02/20   Geradine Girt, DO  magnesium oxide (MAG-OX) 400 MG tablet Take 400 mg by mouth daily. 07/20/20 07/20/21  [provider]  melatonin 3 MG TABS tablet Take 6 mg by mouth at bedtime. 07/19/20   [provider]  Multiple Vitamins-Minerals (MULTIVITAMIN WITH  MINERALS) tablet Take 1 tablet by mouth daily.    [provider]  potassium chloride SA (KLOR-CON) 20 MEQ tablet Take 1 tablet (20 mEq total) by mouth daily. 08/02/20   Geradine Girt, DO  torsemide (DEMADEX) 20 MG tablet Take 1 tablet (20 mg total) by mouth daily. 08/02/20   Geradine Girt, DO    Allergies    Codeine, Nitrofurantoin, and Penicillins  Review of Systems   Review of Systems  Unable to perform ROS: Acuity of condition    Physical Exam Updated Vital Signs There were no vitals taken for this visit.  Physical Exam Vitals and nursing note reviewed.  HENT:     Head: Normocephalic.     Right Ear: External ear normal.     Left Ear: External ear normal.     Nose: Nose normal.  Eyes:     Extraocular Movements: Extraocular movements intact.  Cardiovascular:     Rate and Rhythm: Normal rate. Rhythm irregular.     Pulses: Normal pulses.  Pulmonary:     Effort: Respiratory distress present.     Breath sounds: Rhonchi present.     Comments: Decreased breath sounds throughout with some rhonchi Abdominal:     General: There is distension.     Tenderness: There is no abdominal tenderness.  Musculoskeletal:        General: Normal range of motion.     Cervical back: Normal range of motion.     Right lower leg: Edema present.     Left lower leg: Edema present.  Skin:    General: Skin is warm and dry.     Capillary Refill: Capillary refill takes less than 2 seconds.  Neurological:     General: No focal deficit present.     Mental Status: Kristin Adkins is alert.     ED Results / Procedures / Treatments   Labs (all labs ordered are listed, but only abnormal results are displayed) Labs Reviewed  CBC - Abnormal; Notable for the following components:      Result Value   WBC 15.4 (*)    All other components within normal limits  COMPREHENSIVE METABOLIC PANEL - Abnormal; Notable for the following components:   Sodium 132 (*)    CO2 19 (*)    Glucose, Bld 300 (*)    BUN  25 (*)    Creatinine, Ser 2.39 (*)    Albumin 3.2 (*)    AST 93 (*)    ALT 60 (*)    GFR, Estimated 18 (*)    All other components within normal limits  I-STAT VENOUS BLOOD GAS, ED - Abnormal; Notable for the following components:   pH, Ven 7.156 (*)    pO2, Ven 80.0 (*)    Acid-base deficit 9.0 (*)    Sodium 134 (*)    All other components within normal limits  RESP PANEL BY RT-PCR (FLU A&B, COVID) ARPGX2  BRAIN NATRIURETIC PEPTIDE  BLOOD GAS, VENOUS    EKG EKG Interpretation  Date/Time:  Sunday August 27 2020 07:30:31 EDT Ventricular Rate:  95 PR Interval:    QRS Duration: 168 QT Interval:  382 QTC Calculation: 481 R Axis:   18 Text Interpretation: Atrial fibrillation Left bundle branch block Confirmed by Pattricia Boss [phone]) on 08/27/2020 9:33:41 AM   Radiology DG Chest Portable 1 View  Result Date: 08/27/2020 CLINICAL DATA:  87 year old female with severe respiratory distress. EXAM: PORTABLE CHEST 1 VIEW COMPARISON:  Portable chest 08/11/2020 and earlier. FINDINGS: Portable AP semi upright view at 0732 hours. Stable cardiomegaly and mediastinal contours. Bilateral pleural effusions have not significantly changed. Diffuse pulmonary interstitial opacity has increased in both lungs. No pneumothorax. Visualized tracheal air column is within normal limits. No air bronchograms. Osteopenia. IMPRESSION: Increased diffuse pulmonary interstitial edema. Viral/atypical respiratory infection felt less likely. Bilateral pleural effusions with lung base atelectasis appear stable since 08/11/2020. Electronically Signed   By: Genevie Ann M.D.   On: 08/27/2020 07:50    Procedures .Critical Care Performed by: Pattricia Boss, MD Authorized by: Pattricia Boss, MD   Critical care provider statement:    Critical care time (minutes):  60   Critical care was necessary to treat or prevent imminent or life-threatening deterioration of the following conditions:  Respiratory failure   Critical care was  time spent personally by me on the following activities:  Discussions with consultants, evaluation of patient's response to treatment, examination of patient, ordering and performing treatments and interventions, ordering and review of laboratory studies, ordering and review of radiographic studies, pulse oximetry, re-evaluation of patient's condition, obtaining history from patient or surrogate and review of old charts     Medications Ordered in ED Medications  furosemide (LASIX) injection 80 mg (has no administration in time range)    ED Course  I have reviewed the triage vital signs and the nursing notes.  Pertinent labs & imaging results that were available during my care of the patient were reviewed by me and considered in my medical decision making (see chart for details).    MDM Rules/Calculators/A&P                         87 year old female history of CHF with last EF 30 to 35%, A. fib, on anticoagulation, chronic kidney disease, COVID-19 infection on June 25, 2020 presents today with onset of dyspnea.  Patient placed on BiPAP, given Lasix, and low-dose nitroglycerin.  Kristin Adkins has had significantly decreased blood pressure down to 90.  Kristin Adkins required 200 cc of normal saline.  Patient has been diuresing.  Her respiratory status is improved with oxygen saturations increased from 80s to 97%.  Kristin Adkins remains tachypneic with some increased work of breathing. 1 acute respiratory failure likely secondary to CHF with pulmonary edema 2- esrd/ckd creatinine increased to 2.39 from 1.94 s3/17/22 3-hyperglycemia- likely secondary to stress response.  Will trend 4- afib- patient appears stable with no significant change in ekg 5- chronic anticoagulation secondarty to #4 6- covid infection January 2022  Discussed with son [name], he is in route to hospital.  Confirmed DNR status Discussed with critical care  12:10 PM Critical care has seen and evaluated patient.  Have had discussion with son.  He  does not want aggressive care.  Patient has improved from a respiratory status but remains on BiPAP.  They feel that station can be admitted to progressive care Hospitalist team and palliative care are being consulted Discussed with Dr. Jamse Arn, on-call for hospitalist who will see for admission Discussed with Amber on for palliative care and Kristin Adkins will see  in consult  Final Clinical Impression(s) / ED Diagnoses Final diagnoses:  Acute respiratory failure with hypoxia [facility])    Rx / DC Orders ED Discharge Orders    None       Pattricia Boss, MD 08/27/20 1235

## 2020-08-27 NOTE — Progress Notes (Signed)
Date and time results received: 08/27/20 1750   Test: pO2- <31 Critical Value: <31  Name of Provider Notified: Dr. Jamse Arn  Orders Received? ABG ordered

## 2020-08-27 NOTE — Progress Notes (Signed)
7:30PM RN called to review pt's ABG. She was hypoxic and was already on supp. O2 via HFNC at 6lpm with an O2 sat of 98-100%. We shall continue to monitor.

## 2020-08-27 NOTE — Progress Notes (Signed)
Pt refusing to wear BIpap. Pt reports she cant breath with mask on. Pt placed on 15L HFNC. Pt continued to remove this as well "reports the air is to much" decreased flow to 6L and pt wearing at this time at 100%. When pt pulled off oxygen sats remained 96%-100% MD is aware. Awaiting [facility].

## 2020-08-27 NOTE — Progress Notes (Signed)
Text page Dr. Jodene Nam to look at most recent ABG's result

## 2020-08-27 NOTE — ED Notes (Signed)
Pt was switched to pallative care, so I have her VS cycling every 2 hours now.

## 2020-08-27 NOTE — ED Notes (Signed)
Attempted to give reportx1 

## 2020-08-27 NOTE — Consult Note (Signed)
NAME:  Kristin Adkins, MRN:  [id number], DOB:  [birth year], LOS: 0 ADMISSION DATE:  08/27/2020, CONSULTATION DATE:  08/27/20 REFERRING MD:  Dr Jeanell Sparrow, CHIEF COMPLAINT:  hypotension   History of Present Illness:  87 yo female with pmh of HTN, Combined systolic/diastolic hf, afib on a/c, ckd 4 with solitary kidney and with  4 hospitalizations since December 2021 for heart failure.   She presented today via ems after she was found in resp distress at her snf. Per EMS sats were 86% on RA and was placed on NIV but failed to keep it on so was transitioned to NRB.  Upon arrival to the ED she was still labored respirations. Given lasix and nitro and placed on NIV with subsequent hypotension for which we were called for.   Upon my evaluation of the patient she is calm on NIV and appears comfortable. She states her breathing is improved since arrival. Her BP has also improved with the small fluid bolus that she was given. She is stable for admission to progressive at this time.   I did speak at length with the patients son about the pt's presentation and clinical course thus far. I explained about her hypotenstion that did resolve in this instance but the likelihood of it to reoccur is certainly high and attempted to get guidance as to his wishes should that occur. He states clearly she is DNR/DNI but is unable to make the determination of if he would want her to be transferred to ICU for cvc placement and pressor support at this time. He did inquire about her overall prognosis and condition at this time. I expressed to him that at this time she is comfortable on NIV and bp is stable, however she has been admitted for hf symptoms multiple times over the past 3 months and cyclical admission of a 87 year old pt with her medical history tends to have poor prognosis.   Pertinent  Medical History  Combined hf HTN ckd4 Single kidney afib   Significant Hospital Events: Including procedures, antibiotic start and  stop dates in addition to other pertinent events   . 3/20: admitted to progressive for NIV support and close monitoring  Interim History / Subjective:    Objective   Blood pressure 124/62, pulse 79, temperature (!) 97.5 F (36.4 C), temperature source Axillary, resp. rate (!) 24, height '5\' 2"'$  (1.575 m), weight 68.5 kg, SpO2 100 %.    FiO2 (%):  [70 %] 70 %   Intake/Output Summary (Last 24 hours) at 08/27/2020 1315 Last data filed at 08/27/2020 [id number] Gross per 24 hour  Intake 110 ml  Output -  Net 110 ml   Filed Weights   08/27/20 0817  Weight: 68.5 kg    Examination: General: elderly female reclining comfortably on NIV in ED bed, easily arousable HENT: NCAT, NIV in place limiting oral examination, sclera non icteric but injected Lungs: diminished bilaterally with bi-basilar rales Cardiovascular: irreg irreg Abdomen: mildly distended but soft and non tender Extremities: multiple areas of scaling, 2-3+ edema Neuro: arousable, moves all 4 weakly and spontaneously. oriented to self only GU: deferred  Labs/imaging personally reviewed   Cr 2.39 (baseline ~2) BNP 1383 WBC 15.4 AST/ALT 93-60, glucose 300  Resolved Hospital Problem list   hypotension  Assessment & Plan:  Acute hypoxic/hypercarbic resp failure:  -2/2 pulmonary edema and acute on chronic combined systolic/diastolic hf -NIV as needed and wean for goal sat >90% -recent covid infection in January, pcr negative for this  today Pleural effusions:  -stable since last imaging at previous hospitalization this month -suspect 2/2 her hf Moderate pulmonary artery hypertension:  -rvsp 55 on echo 08/12/20  Acute on chronic decompensated systolic/diastolic hf: -lasix as needed (although monitoring with single kidney and ckd4 already) -certainly palliative care consultation would be appropriate considering freq admissions, age and comorbidities.  -last echo 08/12/20 with LVEF 30-35$ and grade I diastolic dysfunction H/o Htn,  recently hypotensive:  -per primary -hypotension has resolved -recommend holding home anti-htn agents until stabilizes more -do not recommend cvc and pressor therapies in this 89 dnr/dni pt with freq hospitalizations of late and conflicting chronic comorbidities, this was discussed with son who would like more time to be able to finalize decision on this.  Afib:  -rate control -cont a/c LBBB:  -on previous ekgs   Best practice (evaluated daily)  Diet: per primary Pain/Anxiety/Delirium protocol (if indicated): No VAP protocol (if indicated): Not indicated DVT prophylaxis: Systemic AC GI prophylaxis: N/A Glucose control:  Per primary Central venous access:  N/A Arterial line:  N/A Foley:  N/A Mobility: n/a PT consulted: per primary multidisciplinary goals of care discussion [per primary] Code Status:  DNR Disposition: progressive  Labs   CBC: Recent Labs  Lab 08/27/20 0748 08/27/20 0804  WBC 15.4*  --   HGB 12.9 13.6  HCT 42.9 40.0  MCV 91.9  --   PLT 202  --     Basic Metabolic Panel: Recent Labs  Lab 08/24/20 1600 08/27/20 0748 08/27/20 0804  NA 139 132* 134*  K 4.5 5.0 5.1  CL 102 100  --   CO2 20 19*  --   GLUCOSE 136* 300*  --   BUN 24 25*  --   CREATININE 1.94* 2.39*  --   CALCIUM 9.6 9.6  --    GFR: Estimated Creatinine Clearance: 12.2 mL/min (A) (by C-G formula based on SCr of 2.39 mg/dL (H)). Recent Labs  Lab 08/27/20 0748  WBC 15.4*    Liver Function Tests: Recent Labs  Lab 08/27/20 0748  AST 93*  ALT 60*  ALKPHOS 100  BILITOT 0.8  PROT 6.8  ALBUMIN 3.2*   No results for input(s): LIPASE, AMYLASE in the last 168 hours. No results for input(s): AMMONIA in the last 168 hours.  ABG    Component Value Date/Time   PHART 7.204 (L) 08/11/2020 0617   PCO2ART 61.2 (H) 08/11/2020 0617   PO2ART 88 08/11/2020 0617   HCO3 20.2 08/27/2020 0804   TCO2 22 08/27/2020 0804   ACIDBASEDEF 9.0 (H) 08/27/2020 0804   O2SAT 91.0 08/27/2020 0804      Coagulation Profile: No results for input(s): INR, PROTIME in the last 168 hours.  Cardiac Enzymes: No results for input(s): CKTOTAL, CKMB, CKMBINDEX, TROPONINI in the last 168 hours.  HbA1C: Hgb A1c MFr Bld  Date/Time Value Ref Range Status  07/30/2020 09:10 AM 5.2 4.8 - 5.6 % Final    Comment:    (NOTE) Pre diabetes:          5.7%-6.4%  Diabetes:              >6.4%  Glycemic control for   <7.0% adults with diabetes     CBG: No results for input(s): GLUCAP in the last 168 hours.  Review of Systems:   "mask is too tight"  Improvement in breathing, denies cp, cough, sputum production. Denies knowing location or why she came in.  Poor historian so unsure about ROS accuracy but does appear  comfortable  Past Medical History:  She,  has a past medical history of AF (atrial fibrillation) ([place]), CHF (congestive heart failure) ([place]), CKD (chronic kidney disease), stage IV ([place]), COPD (chronic obstructive pulmonary disease) ([place]), Coronary artery disease, Hypertension, Myocardial infarction ([place]), and Respiratory failure ([place]).   Surgical History:   Past Surgical History:  Procedure Laterality Date  . 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 She has never used smokeless tobacco. She reports that she does not drink alcohol.   Family History:  Her family history is not on file.   Allergies Allergies  Allergen Reactions  . Codeine     Other reaction(s): UNKNOWN  . Nitrofurantoin     Other reaction(s): UNKNOWN  . Penicillins Rash     Home Medications  Prior to Admission medications   Medication Sig Start Date End Date Taking? Authorizing Provider  acetaminophen (TYLENOL) 500 MG tablet Take 500 mg by mouth every 6 (six) hours as needed for fever or mild pain (discomfort).   Yes [provider]  albuterol (PROVENTIL) (2.5 MG/3ML) 0.083% nebulizer  solution Take 3 mLs (2.5 mg total) by nebulization every 6 (six) hours as needed for shortness of breath or wheezing. 08/02/20  Yes Geradine Girt, DO  amiodarone (PACERONE) 100 MG tablet Take 100 mg by mouth daily. 07/19/20 08/01/21 Yes [provider]  apixaban (ELIQUIS) 2.5 MG TABS tablet Take 1 tablet (2.5 mg total) by mouth 2 (two) times daily. 08/02/20  Yes Eulogio Bear U, DO  famotidine (PEPCID) 10 MG tablet Take 1 tablet (10 mg total) by mouth daily. 08/02/20 08/02/21 Yes Vann, Jessica U, DO  hydrALAZINE (APRESOLINE) 10 MG tablet Take 1 tablet (10 mg total) by mouth every 8 (eight) hours. 08/02/20  Yes Geradine Girt, DO  [employer] [facility] EX) Apply 1 application topically 3 (three) times daily. To buttocks   Yes [provider]  isosorbide mononitrate (IMDUR) 30 MG 24 hr tablet Take 30 mg by mouth daily.   Yes [provider]  magnesium oxide (MAG-OX) 400 MG tablet Take 400 mg by mouth daily. 07/20/20 07/20/21 Yes [provider]  melatonin 3 MG TABS tablet Take 6 mg by mouth at bedtime. 07/19/20  Yes [provider]  Multiple Vitamins-Minerals (MULTIVITAMIN WITH MINERALS) tablet Take 1 tablet by mouth daily.   Yes [provider]  NON FORMULARY Take 120 mLs by mouth 2 (two) times daily. Med Pass   Yes [provider]  NON FORMULARY Take 120 mLs by mouth in the morning, at noon, in the evening, and at bedtime. Fluids   Yes [provider]  potassium chloride SA (KLOR-CON) 20 MEQ tablet Take 1 tablet (20 mEq total) by mouth daily. 08/02/20  Yes Geradine Girt, DO  spironolactone (ALDACTONE) 25 MG tablet Take 12.5 mg by mouth daily.   Yes [provider]  torsemide (DEMADEX) 20 MG tablet Take 1 tablet (20 mg total) by mouth daily. 08/02/20  Yes Geradine Girt, DO     Critical care time: The patient is critically ill with multiple organ systems failure and requires high complexity decision making for  assessment and support, frequent evaluation and titration of therapies, application of advanced monitoring technologies and extensive interpretation of multiple databases.  Critical care time 35 mins. This represents my time independent of the NPs time taking care  of the pt. This is excluding procedures.    Audria Nine DO [facility] 08/27/2020, 1:15 PM See Amion for pager If no response to pager, please call [phone] until 1900 After 1900 please call [facility] [phone]

## 2020-08-27 NOTE — ED Triage Notes (Signed)
Pt arrived via [facility] from [place] for respiratory distress. Per EMS pt Sa02 86% on RA. Per EMS they had her on c-pap, but pt kept taking it off so they put her on NRB. EMS gave nitro 0.'4mg'$  SL. Fluid bolus 150cc. Pt is A&Ox4. Pt keeps ripping NRB off and saying, "I can't breathe." Pt has dyspnea and labored breathing at rest. Pt is sinus tach  w/ST depression and BBB on monitor

## 2020-08-27 NOTE — H&P (Signed)
History and Physical:    Kristin Adkins   [id number] DOB: 08-Sep-1920 DOA: 08/27/2020  Referring MD/provider: Dr. Jeanell Sparrow PCP: Raelene Bott, MD   Patient coming from: Home  Chief Complaint: Acute respiratory distress  History of Present Illness:   Kristin Adkins is an 87 y.o. female with PMH significant for HTN, combined HFpEF and HFrEF with last EF 30 to 35% with grade 2 diastolic dysfunction, atrial fibrillation, CKD 4 and solitary kidney and dementia who is readmitted with flash pulmonary edema.  This is the patient's fifth admission since February 5 for pulmonary edema.  Patient was most recently discharged 16 days ago after which she was apparently doing well at [facility] until this morning where she was found to be in respiratory distress this morning.  EMS was called and patient was placed on BiPAP after treatment with nitroglycerin.  Patient is unable to provide a history due to dementia.  Patient son states that she was walking in the hallways and in good spirits as of 3 days ago.  He notes this is similar to previous presentations for "when her heart gives out".  ED Course:  The patient was treated with BiPAP, nitroglycerin and Lasix 80 mg x 1.  Patient was initially hypotensive however has responded well to a fluid bolus.  Goals of care were discussed with patient's son by both ED physician Dr. Jeanell Sparrow as well as critical care.  Patient is DNR and patient son agrees to admit patient to progressive unit given no further escalation of care with no intubation or pressors.   ROS:   ROS   Review of Systems: Unable to provide   Past Medical History:   Past Medical History:  Diagnosis Date  . AF (atrial fibrillation) ([place])   . CHF (congestive heart failure) ([place])   . CKD (chronic kidney disease), stage IV ([place])   . COPD (chronic obstructive pulmonary disease) ([place])   . Coronary artery disease   . Hypertension   . Myocardial infarction ([place])   . Respiratory  failure [facility])     Past Surgical History:   Past Surgical History:  Procedure Laterality Date  . 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 Marital status: Married    Spouse name: Not on file  . Number of children: Not on file  . Years of education: Not on file  . Highest education level: Not on file  Occupational History  . Not on file  Tobacco Use  . Smoking status: Never Smoker  . Smokeless tobacco: Never Used  Substance and Sexual Activity  . Alcohol use: Never  . Drug use: Not on file  . Sexual activity: Not on file  Other Topics Concern  . Not on file  Social History Narrative  . Not on file   Social Determinants of Health   Financial Resource Strain: Not on file  Food Insecurity: Not on file  Transportation Needs: Not on file  Physical Activity: Not on file  Stress: Not on file  Social Connections: Not on file  Intimate Partner Violence: Not on file    Allergies   Codeine, Nitrofurantoin, and Penicillins  Family history:   History reviewed. No pertinent family history.  Current Medications:   Prior to Admission medications   Medication Sig Start Date End Date Taking? Authorizing Provider  acetaminophen (TYLENOL) 500 MG tablet  Take 500 mg by mouth every 6 (six) hours as needed for fever or mild pain (discomfort).   Yes [provider]  albuterol (PROVENTIL) (2.5 MG/3ML) 0.083% nebulizer solution Take 3 mLs (2.5 mg total) by nebulization every 6 (six) hours as needed for shortness of breath or wheezing. 08/02/20  Yes Geradine Girt, DO  amiodarone (PACERONE) 100 MG tablet Take 100 mg by mouth daily. 07/19/20 08/01/21 Yes [provider]  apixaban (ELIQUIS) 2.5 MG TABS tablet Take 1 tablet (2.5 mg total) by mouth 2 (two) times daily. 08/02/20  Yes Eulogio Bear U, DO  famotidine (PEPCID) 10 MG tablet Take 1 tablet (10 mg total)  by mouth daily. 08/02/20 08/02/21 Yes Vann, Jessica U, DO  hydrALAZINE (APRESOLINE) 10 MG tablet Take 1 tablet (10 mg total) by mouth every 8 (eight) hours. 08/02/20  Yes Geradine Girt, DO  [employer] [facility] EX) Apply 1 application topically 3 (three) times daily. To buttocks   Yes [provider]  isosorbide mononitrate (IMDUR) 30 MG 24 hr tablet Take 30 mg by mouth daily.   Yes [provider]  magnesium oxide (MAG-OX) 400 MG tablet Take 400 mg by mouth daily. 07/20/20 07/20/21 Yes [provider]  melatonin 3 MG TABS tablet Take 6 mg by mouth at bedtime. 07/19/20  Yes [provider]  Multiple Vitamins-Minerals (MULTIVITAMIN WITH MINERALS) tablet Take 1 tablet by mouth daily.   Yes [provider]  NON FORMULARY Take 120 mLs by mouth 2 (two) times daily. Med Pass   Yes [provider]  NON FORMULARY Take 120 mLs by mouth in the morning, at noon, in the evening, and at bedtime. Fluids   Yes [provider]  potassium chloride SA (KLOR-CON) 20 MEQ tablet Take 1 tablet (20 mEq total) by mouth daily. 08/02/20  Yes Geradine Girt, DO  spironolactone (ALDACTONE) 25 MG tablet Take 12.5 mg by mouth daily.   Yes [provider]  torsemide (DEMADEX) 20 MG tablet Take 1 tablet (20 mg total) by mouth daily. 08/02/20  Yes Geradine Girt, DO    Physical Exam:   Vitals:   08/27/20 1215 08/27/20 1230 08/27/20 1245 08/27/20 1300  BP: (!) 122/56 124/60 102/89 124/62  Pulse: 76 77 76 79  Resp: '20 15 15 '$ (!) 24  Temp:      TempSrc:      SpO2: 100% 100% 100% 100%  Weight:      Height:         Physical Exam: Blood pressure 124/62, pulse 79, temperature (!) 97.5 F (36.4 C), temperature source Axillary, resp. rate (!) 24, height '5\' 2"'$  (1.575 m), weight 68.5 kg, SpO2 100 %. Gen: Frail elderly female on BiPAP sitting up in bed with tachypnea.  She attempts to take the mask off but is responsive to son asking her to keep the  mask on. Eyes: sclera anicteric, conjuctiva mildly injected bilaterally CVS: Distant heart sounds, irregular Respiratory: Mechanical heart sounds with rales one quarter of the way up  GI: NABS, soft, NT  LE: Trace edema bilaterally Neuro: Difficult to assess given dementia and respiratory extremis   Data Review:    Labs: Basic Metabolic Panel: Recent Labs  Lab 08/24/20 1600 08/27/20 0748 08/27/20 0804  NA 139 132* 134*  K 4.5 5.0 5.1  CL 102 100  --   CO2 20 19*  --   GLUCOSE 136* 300*  --   BUN 24 25*  --   CREATININE  1.94* 2.39*  --   CALCIUM 9.6 9.6  --    Liver Function Tests: Recent Labs  Lab 08/27/20 0748  AST 93*  ALT 60*  ALKPHOS 100  BILITOT 0.8  PROT 6.8  ALBUMIN 3.2*   No results for input(s): LIPASE, AMYLASE in the last 168 hours. No results for input(s): AMMONIA in the last 168 hours. CBC: Recent Labs  Lab 08/27/20 0748 08/27/20 0804  WBC 15.4*  --   HGB 12.9 13.6  HCT 42.9 40.0  MCV 91.9  --   PLT 202  --    Cardiac Enzymes: No results for input(s): CKTOTAL, CKMB, CKMBINDEX, TROPONINI in the last 168 hours.  BNP (last 3 results) No results for input(s): PROBNP in the last 8760 hours. CBG: No results for input(s): GLUCAP in the last 168 hours.  Urinalysis    Component Value Date/Time   COLORURINE YELLOW 07/30/2020 0906   APPEARANCEUR CLEAR 07/30/2020 0906   LABSPEC 1.006 07/30/2020 0906   PHURINE 5.0 07/30/2020 0906   GLUCOSEU NEGATIVE 07/30/2020 0906   HGBUR NEGATIVE 07/30/2020 0906   BILIRUBINUR NEGATIVE 07/30/2020 0906   KETONESUR NEGATIVE 07/30/2020 0906   PROTEINUR NEGATIVE 07/30/2020 0906   NITRITE NEGATIVE 07/30/2020 0906   LEUKOCYTESUR NEGATIVE 07/30/2020 0906      Radiographic Studies: DG Chest Portable 1 View  Result Date: 08/27/2020 CLINICAL DATA:  87 year old female with severe respiratory distress. EXAM: PORTABLE CHEST 1 VIEW COMPARISON:  Portable chest 08/11/2020 and earlier. FINDINGS: Portable AP semi upright  view at 0732 hours. Stable cardiomegaly and mediastinal contours. Bilateral pleural effusions have not significantly changed. Diffuse pulmonary interstitial opacity has increased in both lungs. No pneumothorax. Visualized tracheal air column is within normal limits. No air bronchograms. Osteopenia. IMPRESSION: Increased diffuse pulmonary interstitial edema. Viral/atypical respiratory infection felt less likely. Bilateral pleural effusions with lung base atelectasis appear stable since 08/11/2020. Electronically Signed   By: Genevie Ann M.D.   On: 08/27/2020 07:50    EKG: Independently reviewed.  Atrial fibrillation at 95.  LBBB, seen on previous EKGs.   Assessment/Plan:   Principal Problem:   Acute respiratory failure with hypoxia (HCC) Active Problems:   Acute on chronic combined systolic and diastolic CHF (congestive heart failure) (HCC)   DNR (do not resuscitate)   AF (paroxysmal atrial fibrillation) (HCC)   Chronic anticoagulation   Transient hypotension   Type 2 diabetes mellitus without complication ([place])   Acute kidney injury superimposed on chronic kidney disease ([place])  87 year old female is readmitted with flash pulmonary edema.  Acute respiratory failure secondary to recurrent flash pulmonary edema Patient is responded well to Lasix 80 IV in the ED, will continue with Lasix IV daily Will need to watch for hypotension which she had earlier, responded to IV fluids. Hold spironolactone Continue BiPAP and wean as tolerated Continue discussion regarding goals for care with her son Palliative care is consulted  Hypotension Has responded well to IV bolus given in the ED Hold hydralazine, Imdur and spironolactone for now  DM2 Does not appear to be on any antiglycemic meds as an outpatient SSI at bedtime very sensitive dose   Acute on chronic kidney failure Will need to be careful with kidney function given single kidney as well as ongoing diuresis. Hopefully renal function will  improve with earlier fluid bolus and with improved [place] forces with diuresis.  Atrial fibrillation Continue amiodarone and Eliquis  Dementia Patient does not seem to be on any antidementia meds as an outpatient  Goals for Care  Continue discussion with son He has apparently discussed patient with hosp[facility] of [facility] He is considering moving towards hospice care after discharge from here Palliative care consult placed   Patient is DNR  Other information:   DVT prophylaxis: Eliquis ordered. Code Status: DNR Family Communication: Patient's son was present throughout Disposition Plan: Collapse nursing Consults called: Critical care was consulted in ED Admission status: Inpatient  [facility]  If 7PM-7AM, please contact night-coverage [email or web address] Password [email or web address] 08/27/2020, 1:42 PM

## 2020-08-27 NOTE — ED Notes (Signed)
Pt had a BM, I cleaned pt and applied a clean brief.

## 2020-08-27 NOTE — Consult Note (Signed)
Consultation Note Date: 08/27/2020   Patient Name: Kristin Adkins  DOB: 04-Sep-1920  MRN: [id number]  Age / Sex: 87 y.o., female  PCP: Raelene Bott, MD Referring Physician: Oren Binet*  Reason for Consultation: Establishing goals of care, "87 yo with chf and multiple admissions for acute dyspnea, dni/dnr"  HPI/Patient Profile: 87 y.o. female  with past medical history of HTN, atrial fibrillation on anticoagulation, MI, COPD, CKD stage IV with solitary kidney, CHF, and recent COVID infection Jan 2022 presented to the ED on 08/27/20 from [facility] due to respiratory distress. Patient was admitted on 08/27/2020 with acute respiratory failure with hypoxia likely secondary to CHF with pulmonary edema, acute respiratory failure secondary to recurrent flash pulmonary edema, hypotension, acute on chronic kidney failure.   Of note, this is patient's fifth admission at [facility] since February with most recent admission 3/4-08/15/20, all for the same issue of acute respiratory failure secondary to CHF with pulmonary edema. PMT was consulted on most recent admission and son opted for DNR/DNI and full scope treatment at that time.  ED Course: Patient placed on BiPAP, given Lasix, and low-dose nitroglycerin.  She has had significantly decreased blood pressure down to 90.  She required 200 cc of normal saline.  Patient has been diuresing.  Her respiratory status is improved with oxygen saturations increased from 80s to 97%.  She remains tachypneic with some increased work of breathing. Patient has improved from a respiratory status but remains on BiPAP.  They feel that station can be admitted to progressive care Hospitalist team and palliative care are being consulted  Clinical Assessment and Goals of Care: I have reviewed medical records including EPIC notes, labs, and imaging. Received report from primary RN -  no acute concerns.   Went to visit patient at bedside - son/Dennis was present. Patient was lying in bed awake, alert, disoriented, and unable to participate in complex medical decision making. No signs or non-verbal gestures of pain or discomfort noted. No respiratory distress, increased work of breathing, or secretions noted. Patient is on BiPap. She denies pain or shortness of breath.  Met with son/Dennis to discuss diagnosis, prognosis, GOC, EOL wishes, disposition, and options.  [name] is familiar with PMT services - he remembers meeting with PMT during patient's last hospitalization. I re-introduced Palliative Medicine as specialized medical care for people living with serious illness. It focuses on providing relief from the symptoms and stress of a serious illness. The goal is to improve quality of life for both the patient and the family.  We discussed a brief life review of the patient as well as functional and nutritional status. 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 enjoys being around people, gardening, and values her independence. She has one son, [name]. The patient lived alone until June 2021 when [name] began living with her after she sustained a fall. [name] moved into an assisted living facility, [facility],  in January 2022. Unfortunately, soon after she moved in she was infected with COVID and admitted to the hospital - it was at this time [name] notes a drastic decline in the patient's health. Since January, [name] tells me between two hospitals, the patient has had 8 admissions for her CHF. After the patient's most recent discharge from [facility] on 3/8, the patient was discharged to [facility] for rehab. [name] tells me that the patient was supposed to be transitioning over to [facility] side tomorrow 3/21. [name] does not feel the patient is safe to return to live alone at home in  assisted living. [name] tells me that the patient's appetite and PO intake has declined, but the patient eats "well enough and loves desserts." The patient does not have a formal diagnosis of dementia, but [name] states she does have dementia and memory loss.  We discussed patient's current illness and what it means in the larger context of patient's on-going co-morbidities. Educated [name] that CKD and CHF are progressive, non-curable disease. We discussed that she is at high risk of rehospitalizations as evidenced by her progressing chronic illnesses and frequent admissions since January. Reviewed that patient is currently having multiorgan system failure. Natural disease trajectory and expectations at EOL were discussed. I attempted to elicit values and goals of care important to the patient. The difference between aggressive medical intervention and comfort care was considered in light of the patient's goals of care. [name] reflects on previous discussions with PMT. He states that he "knows what's happening" (patient is approaching end of life); however, he described feeling "stunned" after discussing end of life care with PMT during last admission. He did talk to [facility] about hospice care, which he found out they can support, and also spoke with [facility]. [name] states he knows in his head he should agree to hospice, but emotionally is having a hard time making that decision. He mentioned hoping the patient could make these decisions for herself - gently educated the patient will not be able to do this. [name] feels by choosing hospice he is "playing God." We reviewed that transitioning to hospice would not be what takes the patient's life, the diseases will, and by prolonging the patient's life could also be considered "playing God" rather than letting nature take it's course. We also reviewed what no matter which option he chooses  (aggressive vs comfort), the patient's time is limited. I encouraged him  to think about how the patient would want to spend her last weeks/months - in/out of the hospital vs at home.We discussed the concept of a comfort path in detail and encouraged [name] to think about at what point patient would want to stop aggressive medical interventions and focus on comfort and dignity rather than cure/prolonging life. [name] states that he is not sure he is ready for hospice at this time.   I explained and encouraged outpatient Palliative Care services. [name] was very confused on the difference between [facility] and hospice. Discussion on the difference between Palliative and Hospice care. Palliative care and hospice have similar goals of managing symptoms, promoting comfort, improving quality of life, and maintaining a person's dignity. However, palliative care may be offered during any phase of a serious illness, while hospice care is usually offered when a person is expected to live for 6 months or less. [name] will consider outpatient PC - he would like to speak with staff at [place] first.  [name] states his goal is to medically optimize the patient "since  she is here." We reviewed patient's previous low blood pressure and refusal to wear BiPap earlier - we discussed options if patient declined. Detailed discussion was had on levels of care (ICU vs progressive unit) per patient request. Education on interventions such as central lines and pressors were done. [name] struggles to make decisions around these topics. At this time, he states he would not want these interventions; however, "may change his mind" and wants to be notified if patient begins to decline.   [name] is hopeful patient can improve and return to [facility] - he is unsure if he would like to pursue rehab stay again vs transition to LTC as was originally planned to happen on 3/21.  [name]' ultimate goal is for patient to pass peacefully in her sleep.  Discussed with patient/family the importance of continued conversation with  each other and the medical providers regarding overall plan of care and treatment options, ensuring decisions are within the context of the patient's values and GOCs.    Questions and concerns were addressed. The patient/family was encouraged to call with questions and/or concerns. PMT card was provided.    Primary Decision Maker: NEXT OF KIN son/Dennis Lavone Neri    SUMMARY OF RECOMMENDATIONS:  Continue current full scope medical treatment without escalation of care to central line or pressors - if patient declines son wants to be notified  Continue DNR/DNI as previously documented - made copy of durable DNR form that will be scanned into ACP tab/Vynca  Son's goal is for patient to be medically optimized and return to [place] (deciding on rehab or LTC portion)  Son is considering Palliative Care vs hospice on discharge  PMT will continue to follow and support holistically  Code Status/Advance Care Planning:  DNR  Palliative Prophylaxis:   Aspiration, Bowel Regimen, Delirium Protocol, Oral Care and Turn Reposition  Additional Recommendations (Limitations, Scope, Preferences):  Full Scope Treatment, No Artificial Feeding and No Tracheostomy  Psycho-social/Spiritual:   Desire for further Chaplaincy support:no Created space and opportunity for patient and family to express thoughts and feelings regarding patient's current medical situation.   Emotional support and therapeutic listening provided.  Prognosis:   < 6 months  Discharge Planning: To Be Determined      Primary Diagnoses: Present on Admission: . Acute respiratory failure with hypoxia ([place])   I have reviewed the medical record, interviewed the patient and family, and examined the patient. The following aspects are pertinent.  Past Medical History:  Diagnosis Date  . AF (atrial fibrillation) ([place])   . CHF (congestive heart failure) ([place])   . CKD (chronic kidney disease), stage IV ([place])   . COPD  (chronic obstructive pulmonary disease) ([place])   . Coronary artery disease   . Hypertension   . Myocardial infarction ([place])   . Respiratory failure ([place])    Social History   Socioeconomic History  . Marital status: Married    Spouse name: Not on file  . Number of children: Not on file  . Years of education: Not on file  . Highest education level: Not on file  Occupational History  . Not on file  Tobacco Use  . Smoking status: Never Smoker  . Smokeless tobacco: Never Used  Substance and Sexual Activity  . Alcohol use: Never  . Drug use: Not on file  . Sexual activity: Not on file  Other Topics Concern  . Not on file  Social History Narrative  . Not on file   Social Determinants of Health  Financial Resource Strain: Not on file  Food Insecurity: Not on file  Transportation Needs: Not on file  Physical Activity: Not on file  Stress: Not on file  Social Connections: Not on file   History reviewed. No pertinent family history. Scheduled Meds: Continuous Infusions: . nitroGLYCERIN Stopped (08/27/20 0850)   PRN Meds:. Medications Prior to Admission:  Prior to Admission medications   Medication Sig Start Date End Date Taking? Authorizing Provider  acetaminophen (TYLENOL) 500 MG tablet Take 500 mg by mouth every 6 (six) hours as needed for fever or mild pain (discomfort).   Yes [provider]  albuterol (PROVENTIL) (2.5 MG/3ML) 0.083% nebulizer solution Take 3 mLs (2.5 mg total) by nebulization every 6 (six) hours as needed for shortness of breath or wheezing. 08/02/20  Yes Geradine Girt, DO  amiodarone (PACERONE) 100 MG tablet Take 100 mg by mouth daily. 07/19/20 08/01/21 Yes [provider]  apixaban (ELIQUIS) 2.5 MG TABS tablet Take 1 tablet (2.5 mg total) by mouth 2 (two) times daily. 08/02/20  Yes Eulogio Bear U, DO  famotidine (PEPCID) 10 MG tablet Take 1 tablet (10 mg total) by mouth daily. 08/02/20 08/02/21 Yes Vann, Jessica U, DO  hydrALAZINE  (APRESOLINE) 10 MG tablet Take 1 tablet (10 mg total) by mouth every 8 (eight) hours. 08/02/20  Yes Geradine Girt, DO  Infant Care Products [facility] EX) Apply 1 application topically 3 (three) times daily. To buttocks   Yes [provider]  isosorbide mononitrate (IMDUR) 30 MG 24 hr tablet Take 30 mg by mouth daily.   Yes [provider]  magnesium oxide (MAG-OX) 400 MG tablet Take 400 mg by mouth daily. 07/20/20 07/20/21 Yes [provider]  melatonin 3 MG TABS tablet Take 6 mg by mouth at bedtime. 07/19/20  Yes [provider]  Multiple Vitamins-Minerals (MULTIVITAMIN WITH MINERALS) tablet Take 1 tablet by mouth daily.   Yes [provider]  NON FORMULARY Take 120 mLs by mouth 2 (two) times daily. Med Pass   Yes [provider]  NON FORMULARY Take 120 mLs by mouth in the morning, at noon, in the evening, and at bedtime. Fluids   Yes [provider]  potassium chloride SA (KLOR-CON) 20 MEQ tablet Take 1 tablet (20 mEq total) by mouth daily. 08/02/20  Yes Geradine Girt, DO  spironolactone (ALDACTONE) 25 MG tablet Take 12.5 mg by mouth daily.   Yes [provider]  torsemide (DEMADEX) 20 MG tablet Take 1 tablet (20 mg total) by mouth daily. 08/02/20  Yes Geradine Girt, DO   Allergies  Allergen Reactions  . Codeine     Other reaction(s): UNKNOWN  . Nitrofurantoin     Other reaction(s): UNKNOWN  . Penicillins Rash   Review of Systems  Unable to perform ROS: Acuity of condition    Physical Exam Vitals and nursing note reviewed.  Constitutional:      General: She is not in acute distress.    Appearance: She is ill-appearing.  Pulmonary:     Effort: No respiratory distress.     Comments: Wearing BiPap Skin:    General: Skin is warm and dry.  Neurological:     Mental Status: She is alert. She is disoriented and confused.     Motor: Weakness present.  Psychiatric:        Cognition and Memory: Cognition is  impaired. Memory is impaired.        Judgment: Judgment is impulsive.  Vital Signs: BP 124/62   Pulse 79   Temp (!) 97.5 F (36.4 C) (Axillary)   Resp (!) 24   Ht 5' 2"  (1.575 m)   Wt 68.5 kg   SpO2 100%   BMI 27.62 kg/m  Pain Scale: 0-10   Pain Score: 0-No pain   SpO2: SpO2: 100 % O2 Device:SpO2: 100 % O2 Flow Rate: .   IO: Intake/output summary:   Intake/Output Summary (Last 24 hours) at 08/27/2020 1315 Last data filed at 08/27/2020 2984 Gross per 24 hour  Intake 110 ml  Output --  Net 110 ml    LBM:   Baseline Weight: Weight: 68.5 kg Most recent weight: Weight: 68.5 kg     Palliative Assessment/Data: PPS 40%     Time In: 1315 Time Out: 1530 Time Total: 135 minutes  Greater than 50%  of this time was spent counseling and coordinating care related to the above assessment and plan.  Signed by: Lin Landsman, NP   Please contact Palliative Medicine Team phone at [phone] for questions and concerns.  For individual provider: See Shea Evans

## 2020-08-27 NOTE — ED Notes (Signed)
Notified Dr Jeanell Sparrow of pt's bp and obtained orders.

## 2020-08-28 DIAGNOSIS — I480 Paroxysmal atrial fibrillation: Secondary | ICD-10-CM | POA: Diagnosis not present

## 2020-08-28 DIAGNOSIS — N179 Acute kidney failure, unspecified: Secondary | ICD-10-CM | POA: Diagnosis not present

## 2020-08-28 DIAGNOSIS — I5043 Acute on chronic combined systolic (congestive) and diastolic (congestive) heart failure: Secondary | ICD-10-CM | POA: Diagnosis not present

## 2020-08-28 DIAGNOSIS — J9601 Acute respiratory failure with hypoxia: Secondary | ICD-10-CM | POA: Diagnosis not present

## 2020-08-28 LAB — BASIC METABOLIC PANEL
Anion gap: 9 (ref 5–15)
BUN: 29 mg/dL — ABNORMAL HIGH (ref 8–23)
CO2: 23 mmol/L (ref 22–32)
Calcium: 9.5 mg/dL (ref 8.9–10.3)
Chloride: 104 mmol/L (ref 98–111)
Creatinine, Ser: 2.25 mg/dL — ABNORMAL HIGH (ref 0.44–1.00)
GFR, Estimated: 19 mL/min — ABNORMAL LOW (ref >60)
Glucose, Bld: 88 mg/dL (ref 70–99)
Potassium: 5.2 mmol/L — ABNORMAL HIGH (ref 3.5–5.1)
Sodium: 136 mmol/L (ref 135–145)

## 2020-08-28 LAB — MAGNESIUM: Magnesium: 2.2 mg/dL (ref 1.7–2.4)

## 2020-08-28 LAB — GLUCOSE, CAPILLARY
Glucose-Capillary: 103 mg/dL — ABNORMAL HIGH (ref 70–99)
Glucose-Capillary: 104 mg/dL — ABNORMAL HIGH (ref 70–99)
Glucose-Capillary: 126 mg/dL — ABNORMAL HIGH (ref 70–99)
Glucose-Capillary: 133 mg/dL — ABNORMAL HIGH (ref 70–99)

## 2020-08-28 LAB — MRSA PCR SCREENING: MRSA by PCR: POSITIVE — AB

## 2020-08-28 MED ORDER — CHLORHEXIDINE GLUCONATE CLOTH 2 % EX PADS
6.0000 | MEDICATED_PAD | Freq: Every day | CUTANEOUS | Status: AC
  Administered 2020-08-28 – 2020-09-01 (×4): 6 via TOPICAL

## 2020-08-28 MED ORDER — MUPIROCIN 2 % EX OINT
1.0000 "application " | TOPICAL_OINTMENT | Freq: Two times a day (BID) | CUTANEOUS | Status: AC
  Administered 2020-08-28 – 2020-09-01 (×10): 1 via NASAL
  Filled 2020-08-28 (×2): qty 22

## 2020-08-28 NOTE — Progress Notes (Addendum)
PROGRESS NOTE    Alzira Glovier  [id number] DOB: 1921/05/23 DOA: 08/27/2020 PCP: Raelene Bott, MD  Brief Narrative: 87 year old female from SNF with chronic systolic CHF and grade 2 diastolic dysfunction, paroxysmal atrial fibrillation, stage IV CKD, solitary kidney and dementia readmitted with flash pulmonary edema -Fourth hospitalization in 2 months for same, 3rd at [facility] and one in [place]. -In the ED she was noted to be in respiratory distress treated with BiPAP nitroglycerin and Lasix  Assessment & Plan:  Acute hypoxic respiratory failure Recurrent flash pulmonary edema Acute on chronic systolic CHF -EF of 30 to 35% with grade 2 diastolic dysfunction, further complicated by stage IV kidney disease -Suspect she may have been significant coronary disease which could be the cause of recurrent flash pulmonary edema however with her and her advanced age, kidney disease and frailty she is not a candidate for invasive work-up -Continue IV Lasix today -Restart spironolactone in 1 to 2 days -Wean O2 -Palliative care consulted and following, called son and recommended to consider hospice at discharge  Hypotension -More stable now, monitor -Restart Imdur, hydralazine in 1 to 2 days as blood pressure tolerates  Type 2 diabetes mellitus -Diet controlled, sensitive sliding scale insulin  Stage IV chronic kidney disease Solitary kidney -Baseline creatinine around 2-2.5, relatively stable at this time -Continue IV Lasix, monitor kidney function closely  Paroxysmal atrial fibrillation -Continue amiodarone and Eliquis  Dementia, cognitive deficits  Goals of care -Frail chronically ill elderly female with recurrent hospitalizations with flash pulmonary edema, chronic systolic CHF, stage IV kidney disease and dementia -Most appropriate for hospice services at discharge -Palliative care following    DVT prophylaxis: Eliquis Code Status: DNR Family Communication: No family  at bedside,called and updated son [name] Disposition Plan:  Status is: Inpatient  Remains inpatient appropriate because:Inpatient level of care appropriate due to severity of illness   Dispo: The patient is from: SNF              Anticipated d/c is to: SNF              Patient currently is not medically stable to d/c.   Difficult to place patient No  Consultants:   Palliative medicine   Procedures:   Antimicrobials:    Subjective: -Pleasantly confused, weaned off BiPAP  Objective: Vitals:   08/27/20 2031 08/27/20 2354 08/28/20 0314 08/28/20 0738  BP:  111/75 (!) 118/49 (!) 112/41  Pulse:  77 85 79  Resp:  '16 20 20  '$ Temp:  98.2 F (36.8 C) 97.9 F (36.6 C) 98.5 F (36.9 C)  TempSrc:  Axillary Axillary Oral  SpO2: 99% 100% 100% 99%  Weight:   65.8 kg   Height:        Intake/Output Summary (Last 24 hours) at 08/28/2020 0934 Last data filed at 08/28/2020 0600 Gross per 24 hour  Intake 220 ml  Output 900 ml  Net -680 ml   Filed Weights   08/27/20 0817 08/27/20 1607 08/28/20 0314  Weight: 68.5 kg 68.9 kg 65.8 kg    Examination:  General exam: Pleasant elderly chronically ill female lying in bed, awake alert oriented to self only, cognitive deficits noted CVS: S1-S2, regular rate rhythm Lungs: Bilateral rales at the bases Abdomen: Soft, nontender, bowel sounds present Extremities: No edema Skin: No rashes on exposed skin Psychiatry: Poor insight and judgment    Data Reviewed:   CBC: Recent Labs  Lab 08/27/20 0748 08/27/20 0804  WBC 15.4*  --   HGB  12.9 13.6  HCT 42.9 40.0  MCV 91.9  --   PLT 202  --    Basic Metabolic Panel: Recent Labs  Lab 08/24/20 1600 08/27/20 0748 08/27/20 0804 08/28/20 0137  NA 139 132* 134* 136  K 4.5 5.0 5.1 5.2*  CL 102 100  --  104  CO2 20 19*  --  23  GLUCOSE 136* 300*  --  88  BUN 24 25*  --  29*  CREATININE 1.94* 2.39*  --  2.25*  CALCIUM 9.6 9.6  --  9.5  MG  --   --   --  2.2   GFR: Estimated  Creatinine Clearance: 12.7 mL/min (A) (by C-G formula based on SCr of 2.25 mg/dL (H)). Liver Function Tests: Recent Labs  Lab 08/27/20 0748  AST 93*  ALT 60*  ALKPHOS 100  BILITOT 0.8  PROT 6.8  ALBUMIN 3.2*   No results for input(s): LIPASE, AMYLASE in the last 168 hours. No results for input(s): AMMONIA in the last 168 hours. Coagulation Profile: No results for input(s): INR, PROTIME in the last 168 hours. Cardiac Enzymes: No results for input(s): CKTOTAL, CKMB, CKMBINDEX, TROPONINI in the last 168 hours. BNP (last 3 results) No results for input(s): PROBNP in the last 8760 hours. HbA1C: No results for input(s): HGBA1C in the last 72 hours. CBG: Recent Labs  Lab 08/27/20 1641 08/27/20 2136 08/28/20 0601  GLUCAP 97 105* 103*   Lipid Profile: No results for input(s): CHOL, HDL, LDLCALC, TRIG, CHOLHDL, LDLDIRECT in the last 72 hours. Thyroid Function Tests: No results for input(s): TSH, T4TOTAL, FREET4, T3FREE, THYROIDAB in the last 72 hours. Anemia Panel: No results for input(s): VITAMINB12, FOLATE, FERRITIN, TIBC, IRON, RETICCTPCT in the last 72 hours. Urine analysis:    Component Value Date/Time   COLORURINE YELLOW 07/30/2020 0906   APPEARANCEUR CLEAR 07/30/2020 0906   LABSPEC 1.006 07/30/2020 0906   PHURINE 5.0 07/30/2020 0906   GLUCOSEU NEGATIVE 07/30/2020 0906   HGBUR NEGATIVE 07/30/2020 0906   BILIRUBINUR NEGATIVE 07/30/2020 0906   KETONESUR NEGATIVE 07/30/2020 0906   PROTEINUR NEGATIVE 07/30/2020 0906   NITRITE NEGATIVE 07/30/2020 0906   LEUKOCYTESUR NEGATIVE 07/30/2020 0906   Sepsis Labs: '@LABRCNTIP'$ (procalcitonin:4,lacticidven:4)  ) Recent Results (from the past 240 hour(s))  Resp Panel by RT-PCR (Flu A&B, Covid) Nasopharyngeal Swab     Status: None   Collection Time: 08/27/20  8:29 AM   Specimen: Nasopharyngeal Swab; Nasopharyngeal(NP) swabs in vial transport medium  Result Value Ref Range Status   SARS Coronavirus 2 by RT PCR NEGATIVE NEGATIVE  Final    Comment: (NOTE) SARS-CoV-2 target nucleic acids are NOT DETECTED.  The SARS-CoV-2 RNA is generally detectable in upper respiratory specimens during the acute phase of infection. The lowest concentration of SARS-CoV-2 viral copies this assay can detect is 138 copies/mL. A negative result does not preclude SARS-Cov-2 infection and should not be used as the sole basis for treatment or other patient management decisions. A negative result may occur with  improper specimen collection/handling, submission of specimen other than nasopharyngeal swab, presence of viral mutation(s) within the areas targeted by this assay, and inadequate number of viral copies(<138 copies/mL). A negative result must be combined with clinical observations, patient history, and epidemiological information. The expected result is Negative.  Fact Sheet for Patients:  EntrepreneurPulse.com.au  Fact Sheet for Healthcare Providers:  IncredibleEmployment.be  This test is no t yet approved or cleared by the [place] FDA and  has been authorized for detection and/or diagnosis  of SARS-CoV-2 by FDA under an Emergency Use Authorization (EUA). This EUA will remain  in effect (meaning this test can be used) for the duration of the COVID-19 declaration under Section 564(b)(1) of the Act, 21 U.S.C.section 360bbb-3(b)(1), unless the authorization is terminated  or revoked sooner.       Influenza A by PCR NEGATIVE NEGATIVE Final   Influenza B by PCR NEGATIVE NEGATIVE Final    Comment: (NOTE) The Xpert Xpress SARS-CoV-2/FLU/RSV plus assay is intended as an aid in the diagnosis of influenza from Nasopharyngeal swab specimens and should not be used as a sole basis for treatment. Nasal washings and aspirates are unacceptable for Xpert Xpress SARS-CoV-2/FLU/RSV testing.  Fact Sheet for Patients: EntrepreneurPulse.com.au  Fact Sheet for Healthcare  Providers: IncredibleEmployment.be  This test is not yet approved or cleared by the [place] FDA and has been authorized for detection and/or diagnosis of SARS-CoV-2 by FDA under an Emergency Use Authorization (EUA). This EUA will remain in effect (meaning this test can be used) for the duration of the COVID-19 declaration under Section 564(b)(1) of the Act, 21 U.S.C. section 360bbb-3(b)(1), unless the authorization is terminated or revoked.  Performed at [facility], [place] [address] [place], [place] [address]   MRSA PCR Screening     Status: Abnormal   Collection Time: 08/28/20  5:25 AM   Specimen: Nasopharyngeal  Result Value Ref Range Status   MRSA by PCR POSITIVE (A) NEGATIVE Final    Comment:        The GeneXpert MRSA Assay (FDA approved for NASAL specimens only), is one component of a comprehensive MRSA colonization surveillance program. It is not intended to diagnose MRSA infection nor to guide or monitor treatment for MRSA infections. RESULT CALLED TO, READ BACK BY AND VERIFIED WITH: RN L.TYSON AT 0855 ON 08/2020 BY T.SAAD Performed at [facility], [place] [address] [place], [place] [address]          Radiology Studies: DG Chest Portable 1 View  Result Date: 08/27/2020 CLINICAL DATA:  87 year old female with severe respiratory distress. EXAM: PORTABLE CHEST 1 VIEW COMPARISON:  Portable chest 08/11/2020 and earlier. FINDINGS: Portable AP semi upright view at 0732 hours. Stable cardiomegaly and mediastinal contours. Bilateral pleural effusions have not significantly changed. Diffuse pulmonary interstitial opacity has increased in both lungs. No pneumothorax. Visualized tracheal air column is within normal limits. No air bronchograms. Osteopenia. IMPRESSION: Increased diffuse pulmonary interstitial edema. Viral/atypical respiratory infection felt less likely. Bilateral pleural effusions with lung base atelectasis appear stable since  08/11/2020. Electronically Signed   By: Genevie Ann M.D.   On: 08/27/2020 07:50        Scheduled Meds: . amiodarone  100 mg Oral Daily  . apixaban  2.5 mg Oral BID  . famotidine  10 mg Oral Daily  . furosemide  80 mg Intravenous Daily  . insulin aspart  0-6 Units Subcutaneous TID WC  . LORazepam  0.5 mg Intravenous Once  . magnesium oxide  400 mg Oral Daily  . sodium chloride flush  3 mL Intravenous Q12H   Continuous Infusions: . sodium chloride       LOS: 1 day    Time spent:  55mn  PDomenic Polite MD [facility]  08/28/2020, 9:34 AM

## 2020-08-28 NOTE — Progress Notes (Signed)
Daily Progress Note   Patient Name: Kristin Adkins       Date: 08/28/2020 DOB: 04-11-1921  Age: 87 y.o. MRN#: [id number] Attending Physician: Domenic Polite, MD Primary Care Physician: Raelene Bott, MD Admit Date: 08/27/2020  Reason for Consultation/Follow-up: continued [place] discussions  Subjective: Patient is alert, sitting up in bed. She is currently on 3L oxygen via nasal cannula. States she "feels good" - no acute complaints. Lunch tray is in front of her; note that she has eaten maybe 25%. Reports she is not hungry.   No family at bedside. I left a copy of "hard choices" book and my contact card at bedside.  Length of Stay: 1  Current Medications: Scheduled Meds:  . amiodarone  100 mg Oral Daily  . apixaban  2.5 mg Oral BID  . Chlorhexidine Gluconate Cloth  6 each Topical Q0600  . famotidine  10 mg Oral Daily  . furosemide  80 mg Intravenous Daily  . insulin aspart  0-6 Units Subcutaneous TID WC  . LORazepam  0.5 mg Intravenous Once  . mupirocin ointment  1 application Nasal BID  . sodium chloride flush  3 mL Intravenous Q12H    PRN Meds: sodium chloride, acetaminophen, albuterol, sodium chloride flush  Physical Exam Vitals reviewed.  Constitutional:      General: She is not in acute distress.    Comments: Frail, chronically ill-appearing  Cardiovascular:     Rate and Rhythm: Normal rate.  Pulmonary:     Effort: Pulmonary effort is normal.  Neurological:     Mental Status: She is alert.  Psychiatric:        Cognition and Memory: Cognition is impaired.             Vital Signs: BP (!) 117/54 (BP Location: Left Arm)   Pulse 80   Temp 97.7 F (36.5 C) (Oral)   Resp 20   Ht '5\' 2"'$  (1.575 m)   Wt 65.8 kg   SpO2 97%   BMI 26.53 kg/m  SpO2: SpO2: 97 % O2  Device: O2 Device: High Flow Nasal Cannula O2 Flow Rate: O2 Flow Rate (L/min): 3 L/min  Intake/output summary:   Intake/Output Summary (Last 24 hours) at 08/28/2020 1312 Last data filed at 08/28/2020 0600 Gross per 24 hour  Intake 220 ml  Output 900 ml  Net -680 ml   LBM: Last BM Date: 08/28/20 Baseline Weight: Weight: 68.5 kg Most recent weight: Weight: 65.8 kg       Palliative Assessment/Data: PPS 40%      Palliative Care Assessment & Plan   HPI/Patient Profile: 87 y.o. female  with past medical history of HTN, atrial fibrillation on anticoagulation, MI, COPD, CKD stage IV with solitary kidney, CHF, and recent COVID infection Jan 2022 presented to the ED on 08/27/20 from [facility] due to respiratory distress. Patient was admitted on 08/27/2020 with acute respiratory failure with hypoxia likely secondary to CHF with pulmonary edema, acute respiratory failure secondary to recurrent flash pulmonary edema, hypotension, acute on chronic kidney failure.   Of note, this is patient's fifth admission at [facility] since February with most recent admission 3/4-08/15/20, all for the same issue of acute respiratory failure secondary to CHF with pulmonary edema. PMT was consulted on most recent admission and son opted for DNR/DNI and full scope treatment at that time.  ED Course: Patient placed on BiPAP, given Lasix, and low-dose nitroglycerin. She has had significantly decreased blood pressure down to 90. She required 200 cc of normal saline. Patient has been diuresing. Her respiratory status is improved with oxygen saturations increased from 80s to 97%. She remains tachypneic with some increased work of breathing. Patient has improved from a respiratory status but remains on BiPAP. They feel that station can be admitted to progressive care Hospitalist team and palliative care are being consulted  Assessment: - acute hypoxic respiratory failure - recurrent flash pulmonary edema - acute  on chronic systolic CHF - hypotension (improved) - stage IV kidney disease - paroxysmal atrial fibrillation - dementia  Recommendations/Plan:  DNR/DNI as previously documented  Continue current medical treatment  No escalation of care to central line or pressors  Goal of care is to return to [facility] (rehab versus LTC)  Patient would be eligible for hospice if in LTC  PMT will continue to follow  Goals of Care and Additional Recommendations:  Limitations on Scope of Treatment: Full Scope Treatment, No Artificial Feeding and No Tracheostomy  Code Status: DNR/DNI  Prognosis:   < 6 months  Discharge Planning:  SNF   Thank you for allowing the Palliative Medicine Team to assist in the care of this patient.   Total Time 15 minutes Prolonged Time Billed  no       Greater than 50%  of this time was spent counseling and coordinating care related to the above assessment and plan.  Lavena Bullion, NP  Please contact Palliative Medicine Team phone at [phone] for questions and concerns.

## 2020-08-29 DIAGNOSIS — I480 Paroxysmal atrial fibrillation: Secondary | ICD-10-CM | POA: Diagnosis not present

## 2020-08-29 DIAGNOSIS — J9601 Acute respiratory failure with hypoxia: Secondary | ICD-10-CM | POA: Diagnosis not present

## 2020-08-29 DIAGNOSIS — I5043 Acute on chronic combined systolic (congestive) and diastolic (congestive) heart failure: Secondary | ICD-10-CM | POA: Diagnosis not present

## 2020-08-29 DIAGNOSIS — N179 Acute kidney failure, unspecified: Secondary | ICD-10-CM | POA: Diagnosis not present

## 2020-08-29 LAB — CBC
HCT: 35.7 % — ABNORMAL LOW (ref 36.0–46.0)
Hemoglobin: 11.8 g/dL — ABNORMAL LOW (ref 12.0–15.0)
MCH: 28.3 pg (ref 26.0–34.0)
MCHC: 33.1 g/dL (ref 30.0–36.0)
MCV: 85.6 fL (ref 80.0–100.0)
Platelets: 127 10*3/uL — ABNORMAL LOW (ref 150–400)
RBC: 4.17 MIL/uL (ref 3.87–5.11)
RDW: 14 % (ref 11.5–15.5)
WBC: 5.4 10*3/uL (ref 4.0–10.5)
nRBC: 0 % (ref 0.0–0.2)

## 2020-08-29 LAB — GLUCOSE, CAPILLARY
Glucose-Capillary: 108 mg/dL — ABNORMAL HIGH (ref 70–99)
Glucose-Capillary: 109 mg/dL — ABNORMAL HIGH (ref 70–99)
Glucose-Capillary: 105 mg/dL — ABNORMAL HIGH (ref 70–99)
Glucose-Capillary: 142 mg/dL — ABNORMAL HIGH (ref 70–99)

## 2020-08-29 LAB — BASIC METABOLIC PANEL WITH GFR
Anion gap: 11 (ref 5–15)
BUN: 29 mg/dL — ABNORMAL HIGH (ref 8–23)
CO2: 22 mmol/L (ref 22–32)
Calcium: 9.3 mg/dL (ref 8.9–10.3)
Chloride: 101 mmol/L (ref 98–111)
Creatinine, Ser: 2.08 mg/dL — ABNORMAL HIGH (ref 0.44–1.00)
GFR, Estimated: 21 mL/min — ABNORMAL LOW
Glucose, Bld: 108 mg/dL — ABNORMAL HIGH (ref 70–99)
Potassium: 4 mmol/L (ref 3.5–5.1)
Sodium: 134 mmol/L — ABNORMAL LOW (ref 135–145)

## 2020-08-29 MED ORDER — FUROSEMIDE 10 MG/ML IJ SOLN
60.0000 mg | Freq: Every day | INTRAMUSCULAR | Status: DC
  Administered 2020-08-30: 60 mg via INTRAVENOUS
  Filled 2020-08-29: qty 6

## 2020-08-29 NOTE — Progress Notes (Signed)
Pt transported to 2w26. Report given to A Rosie Place, Therapist, sports. All questions answered. Pt's son [name] updated on pt's new room.

## 2020-08-29 NOTE — Progress Notes (Addendum)
PROGRESS NOTE    Kristin Adkins  [id number] DOB: 1921-03-05 DOA: 08/27/2020 PCP: Raelene Bott, MD  Brief Narrative: 89/F from SNF with chronic systolic CHF and grade 2 diastolic dysfunction, paroxysmal atrial fibrillation, stage IV CKD, solitary kidney and dementia readmitted with flash pulmonary edema -Fourth hospitalization in 2 months for same, 3rd at [facility] and one in [place]. -In the ED she was noted to be in respiratory distress treated with BiPAP nitroglycerin and Lasix -Palliative following  Assessment & Plan:  Acute hypoxic respiratory failure Recurrent flash pulmonary edema Acute on chronic systolic CHF -EF of 30 to 35% with grade 2 diastolic dysfunction, further complicated by stage IV kidney disease -Suspect she may have been significant coronary disease which could be the cause of recurrent flash pulmonary edema however with her and her advanced age, kidney disease and frailty she is not a candidate for invasive work-up. -4th admission for same in 2 months -Clinically improving with diuresis, urine output not charted yesterday, continue IV Lasix 1 more day and transition to p.o. tomorrow  -Wean O2, down to 3 L this morning -Could add low-dose Aldactone if blood pressure tolerates -Palliative care consulted and following -I called patient's son again and recommended consideration of hospice services when she is discharged back to SNF this time, he wants to DC to SNF w/ Palliative care and transition to Hospice at SNF down the road  Hypotension -More stable now, monitor -Imdur, hydralazine on hold  Type 2 diabetes mellitus -Diet controlled, sensitive sliding scale insulin  Stage IV chronic kidney disease Solitary kidney -Baseline creatinine around 2-2.5, relatively stable at this time -Continue IV Lasix today, unfortunately creatinine remains stable -BMP in a.m.  Paroxysmal atrial fibrillation -Continue amiodarone and Eliquis  Dementia, cognitive  deficits  Goals of care -Frail chronically ill elderly female with recurrent hospitalizations with flash pulmonary edema, chronic systolic CHF, stage IV kidney disease and dementia -Most appropriate for hospice services at discharge, recommended this to patient's son yesterday -Palliative care following    DVT prophylaxis: Eliquis Code Status: DNR Family Communication: No family at bedside,called and updated son [name] 3/21 Disposition Plan:  Status is: Inpatient  Remains inpatient appropriate because:Inpatient level of care appropriate due to severity of illness   Dispo: The patient is from: SNF              Anticipated d/c is to: SNF              Patient currently is not medically stable to d/c.   Difficult to place patient No  Consultants:   Palliative medicine   Procedures:   Antimicrobials:    Subjective: -Feels better, breathing is improving, down to 3 L of O2 this morning  Objective: Vitals:   08/29/20 0603 08/29/20 0839 08/29/20 1108 08/29/20 1154  BP: (!) 118/46 117/64 (!) 115/50   Pulse: 81 85 71   Resp: '20 18 20   '$ Temp: 97.8 F (36.6 C) 98.3 F (36.8 C) 97.6 F (36.4 C)   TempSrc: Oral Oral Oral   SpO2: 93% 94% 95% 97%  Weight:      Height:        Intake/Output Summary (Last 24 hours) at 08/29/2020 1220 Last data filed at 08/29/2020 0846 Gross per 24 hour  Intake 3 ml  Output --  Net 3 ml   Filed Weights   08/27/20 0817 08/27/20 1607 08/28/20 0314  Weight: 68.5 kg 68.9 kg 65.8 kg    Examination:  General exam: Pleasant elderly  female sitting up in bed, awake alert oriented to self only, cognitive deficits noted CVS: S1-S2, regular rate rhythm Lungs: Few basilar rales, otherwise clear Abdomen: Soft, nontender, bowel sounds present Extremities: No edema Skin: No rashes on exposed skin Psychiatry: Poor insight and judgment    Data Reviewed:   CBC: Recent Labs  Lab 08/27/20 0748 08/27/20 0804 08/29/20 0039  WBC 15.4*  --  5.4   HGB 12.9 13.6 11.8*  HCT 42.9 40.0 35.7*  MCV 91.9  --  85.6  PLT 202  --  [id number]   Basic Metabolic Panel: Recent Labs  Lab 08/24/20 1600 08/27/20 0748 08/27/20 0804 08/28/20 0137 08/29/20 0039  NA 139 132* 134* 136 134*  K 4.5 5.0 5.1 5.2* 4.0  CL 102 100  --  104 101  CO2 20 19*  --  23 22  GLUCOSE 136* 300*  --  88 108*  BUN 24 25*  --  29* 29*  CREATININE 1.94* 2.39*  --  2.25* 2.08*  CALCIUM 9.6 9.6  --  9.5 9.3  MG  --   --   --  2.2  --    GFR: Estimated Creatinine Clearance: 13.8 mL/min (A) (by C-G formula based on SCr of 2.08 mg/dL (H)). Liver Function Tests: Recent Labs  Lab 08/27/20 0748  AST 93*  ALT 60*  ALKPHOS 100  BILITOT 0.8  PROT 6.8  ALBUMIN 3.2*   No results for input(s): LIPASE, AMYLASE in the last 168 hours. No results for input(s): AMMONIA in the last 168 hours. Coagulation Profile: No results for input(s): INR, PROTIME in the last 168 hours. Cardiac Enzymes: No results for input(s): CKTOTAL, CKMB, CKMBINDEX, TROPONINI in the last 168 hours. BNP (last 3 results) No results for input(s): PROBNP in the last 8760 hours. HbA1C: No results for input(s): HGBA1C in the last 72 hours. CBG: Recent Labs  Lab 08/28/20 1124 08/28/20 1631 08/28/20 2114 08/29/20 0557 08/29/20 1127  GLUCAP 104* 126* 133* 108* 109*   Lipid Profile: No results for input(s): CHOL, HDL, LDLCALC, TRIG, CHOLHDL, LDLDIRECT in the last 72 hours. Thyroid Function Tests: No results for input(s): TSH, T4TOTAL, FREET4, T3FREE, THYROIDAB in the last 72 hours. Anemia Panel: No results for input(s): VITAMINB12, FOLATE, FERRITIN, TIBC, IRON, RETICCTPCT in the last 72 hours. Urine analysis:    Component Value Date/Time   COLORURINE YELLOW 07/30/2020 0906   APPEARANCEUR CLEAR 07/30/2020 0906   LABSPEC 1.006 07/30/2020 0906   PHURINE 5.0 07/30/2020 0906   GLUCOSEU NEGATIVE 07/30/2020 0906   HGBUR NEGATIVE 07/30/2020 0906   BILIRUBINUR NEGATIVE 07/30/2020 0906   KETONESUR  NEGATIVE 07/30/2020 0906   PROTEINUR NEGATIVE 07/30/2020 0906   NITRITE NEGATIVE 07/30/2020 0906   LEUKOCYTESUR NEGATIVE 07/30/2020 0906   Sepsis Labs: '@LABRCNTIP'$ (procalcitonin:4,lacticidven:4)  ) Recent Results (from the past 240 hour(s))  Resp Panel by RT-PCR (Flu A&B, Covid) Nasopharyngeal Swab     Status: None   Collection Time: 08/27/20  8:29 AM   Specimen: Nasopharyngeal Swab; Nasopharyngeal(NP) swabs in vial transport medium  Result Value Ref Range Status   SARS Coronavirus 2 by RT PCR NEGATIVE NEGATIVE Final    Comment: (NOTE) SARS-CoV-2 target nucleic acids are NOT DETECTED.  The SARS-CoV-2 RNA is generally detectable in upper respiratory specimens during the acute phase of infection. The lowest concentration of SARS-CoV-2 viral copies this assay can detect is 138 copies/mL. A negative result does not preclude SARS-Cov-2 infection and should not be used as the sole basis for treatment or other patient  management decisions. A negative result may occur with  improper specimen collection/handling, submission of specimen other than nasopharyngeal swab, presence of viral mutation(s) within the areas targeted by this assay, and inadequate number of viral copies(<138 copies/mL). A negative result must be combined with clinical observations, patient history, and epidemiological information. The expected result is Negative.  Fact Sheet for Patients:  EntrepreneurPulse.com.au  Fact Sheet for Healthcare Providers:  IncredibleEmployment.be  This test is no t yet approved or cleared by the [place] FDA and  has been authorized for detection and/or diagnosis of SARS-CoV-2 by FDA under an Emergency Use Authorization (EUA). This EUA will remain  in effect (meaning this test can be used) for the duration of the COVID-19 declaration under Section 564(b)(1) of the Act, 21 U.S.C.section 360bbb-3(b)(1), unless the authorization is terminated  or  revoked sooner.       Influenza A by PCR NEGATIVE NEGATIVE Final   Influenza B by PCR NEGATIVE NEGATIVE Final    Comment: (NOTE) The Xpert Xpress SARS-CoV-2/FLU/RSV plus assay is intended as an aid in the diagnosis of influenza from Nasopharyngeal swab specimens and should not be used as a sole basis for treatment. Nasal washings and aspirates are unacceptable for Xpert Xpress SARS-CoV-2/FLU/RSV testing.  Fact Sheet for Patients: EntrepreneurPulse.com.au  Fact Sheet for Healthcare Providers: IncredibleEmployment.be  This test is not yet approved or cleared by the [place] FDA and has been authorized for detection and/or diagnosis of SARS-CoV-2 by FDA under an Emergency Use Authorization (EUA). This EUA will remain in effect (meaning this test can be used) for the duration of the COVID-19 declaration under Section 564(b)(1) of the Act, 21 U.S.C. section 360bbb-3(b)(1), unless the authorization is terminated or revoked.  Performed at [facility], [place] [address], [place], [place] [address]   MRSA PCR Screening     Status: Abnormal   Collection Time: 08/28/20  5:25 AM   Specimen: Nasopharyngeal  Result Value Ref Range Status   MRSA by PCR POSITIVE (A) NEGATIVE Final    Comment:        The GeneXpert MRSA Assay (FDA approved for NASAL specimens only), is one component of a comprehensive MRSA colonization surveillance program. It is not intended to diagnose MRSA infection nor to guide or monitor treatment for MRSA infections. RESULT CALLED TO, READ BACK BY AND VERIFIED WITH: RN L.TYSON AT 0855 ON 08/2020 BY T.SAAD Performed at [facility], [place] [address] [place], [place] [address]      Scheduled Meds: . amiodarone  100 mg Oral Daily  . apixaban  2.5 mg Oral BID  . Chlorhexidine Gluconate Cloth  6 each Topical Q0600  . famotidine  10 mg Oral Daily  . furosemide  80 mg Intravenous Daily  . insulin aspart  0-6 Units  Subcutaneous TID WC  . LORazepam  0.5 mg Intravenous Once  . mupirocin ointment  1 application Nasal BID  . sodium chloride flush  3 mL Intravenous Q12H   Continuous Infusions: . sodium chloride       LOS: 2 days    Time spent:  52mn  PDomenic Polite MD [facility]  08/29/2020, 12:20 PM

## 2020-08-29 NOTE — Evaluation (Signed)
Physical Therapy Evaluation Patient Details Name: Kristin Adkins MRN: [id number] DOB: 21-Nov-1920 Today's Date: 08/29/2020   History of Present Illness  87 y/o female admitted 3/20 from [facility] for respiratory failure with flash pulmonary edema. 4th hospitalization in 2 months for the same. PMHx: HTN, CHF, Afib, CKD stage IV, dementia, solitary kidney, COVID-19 pneumonia on 1/16.  Clinical Impression  Pt pleasant sitting up in chair on arrival visiting with son. Pt reports she likes to play bingo and unable to recall if she lives at [facility]. Pt with decreased strength and activity tolerance with desaturation on RA with activity. Per son can walk long hall way distance with P.T. at [facility] but also uses WC due to fall risk and cognition. Pt will benefit from acute therapy to maximize mobility, safety and function to decrease burden of care and maintain function.   SpO2 95% on rA at rest with drop to 83% with gait 97% on 2L at rest HR 74    Follow Up Recommendations SNF;Supervision/Assistance - 24 hour    Equipment Recommendations  None recommended by PT    Recommendations for Other Services       Precautions / Restrictions Precautions Precautions: Fall;Other (comment) Precaution Comments: watch O2      Mobility  Bed Mobility               General bed mobility comments: in recliner on arrival and end of session    Transfers Overall transfer level: Needs assistance     Sit to Stand: Min assist         General transfer comment: cues for sequence with assist to rise from surface and to back fully and control descent  Ambulation/Gait Ambulation/Gait assistance: Min assist Gait Distance (Feet): 85 Feet Assistive device: Rolling walker (2 wheeled) Gait Pattern/deviations: Step-through pattern;Trunk flexed;Decreased stride length   Gait velocity interpretation: 1.31 - 2.62 ft/sec, indicative of limited community ambulator General Gait Details: cues for posture,  proximity to RW and activity regulation with fatigue. Pt reported legs starting to feel weak coinciding with drop in SpO2 to 83% on RA with gait and return to chair with SpO2 reapplied and sats returned to 97%  Stairs            Wheelchair Mobility    Modified Rankin (Stroke Patients Only)       Balance Overall balance assessment: Needs assistance   Sitting balance-Leahy Scale: Fair Sitting balance - Comments: Static sitting in chair, supervision for safety.   Standing balance support: Bilateral upper extremity supported;During functional activity Standing balance-Leahy Scale: Poor Standing balance comment: Reliant on UE support on RW.                             Pertinent Vitals/Pain Pain Assessment: No/denies pain    Home Living Family/patient expects to be discharged to:: Skilled nursing facility                 Additional Comments: from [facility]    Prior Function Level of Independence: Needs assistance   Gait / Transfers Assistance Needed: reports using RW with therapy and wheelchair for mobility  ADL's / Homemaking Assistance Needed: Reports assist for dressing, toileting and bathing        Hand Dominance        Extremity/Trunk Assessment   Upper Extremity Assessment Upper Extremity Assessment: Generalized weakness    Lower Extremity Assessment Lower Extremity Assessment: Generalized weakness    Cervical /  Trunk Assessment Cervical / Trunk Assessment: Kyphotic  Communication   Communication: HOH  Cognition Arousal/Alertness: Awake/alert Behavior During Therapy: WFL for tasks assessed/performed;Impulsive Overall Cognitive Status: History of cognitive impairments - at baseline                                 General Comments: hx of dementia, son present and reports she was walking several hundred feet in hall with PT prior to readmission and SOB      General Comments      Exercises General Exercises -  Lower Extremity Long Arc Quad: AROM;Both;15 reps;Seated Hip Flexion/Marching: AROM;Both;10 reps;Seated   Assessment/Plan    PT Assessment Patient needs continued PT services  PT Problem List Decreased strength;Decreased activity tolerance;Decreased balance;Decreased mobility;Decreased coordination;Decreased cognition;Decreased safety awareness;Cardiopulmonary status limiting activity;Decreased range of motion;Decreased knowledge of use of DME       PT Treatment Interventions DME instruction;Gait training;Functional mobility training;Therapeutic activities;Therapeutic exercise;Balance training;Patient/family education;Neuromuscular re-education    PT Goals (Current goals can be found in the Care Plan section)  Acute Rehab PT Goals Patient Stated Goal: walk and go home PT Goal Formulation: With patient/family Time For Goal Achievement: 09/12/20 Potential to Achieve Goals: Fair    Frequency Min 2X/week   Barriers to discharge        Co-evaluation               AM-PAC PT "6 Clicks" Mobility  Outcome Measure Help needed turning from your back to your side while in a flat bed without using bedrails?: A Little Help needed moving from lying on your back to sitting on the side of a flat bed without using bedrails?: A Little Help needed moving to and from a bed to a chair (including a wheelchair)?: A Little Help needed standing up from a chair using your arms (e.g., wheelchair or bedside chair)?: A Little Help needed to walk in hospital room?: A Little Help needed climbing 3-5 steps with a railing? : A Lot 6 Click Score: 17    End of Session Equipment Utilized During Treatment: Gait belt Activity Tolerance: Patient tolerated treatment well Patient left: in chair;with call bell/phone within reach;with chair alarm set;with nursing/sitter in room;with family/visitor present Nurse Communication: Mobility status PT Visit Diagnosis: Unsteadiness on feet (R26.81);Other abnormalities  of gait and mobility (R26.89);History of falling (Z91.81);Muscle weakness (generalized) (M62.81);Difficulty in walking, not elsewhere classified (R26.2)    Time: [id number] PT Time Calculation (min) (ACUTE ONLY): 19 min   Charges:   PT Evaluation $PT Eval Moderate Complexity: 1 Mod          Kristin Adkins P, PT Acute Rehabilitation Services Pager: [phone] Office: [phone]   Pilar Corrales B Meshach Perry 08/29/2020, 11:59 AM

## 2020-08-29 NOTE — Discharge Instructions (Signed)

## 2020-08-29 NOTE — Plan of Care (Signed)
  Problem: Pain Managment: Goal: General experience of comfort will improve Outcome: Progressing   Problem: Safety: Goal: Ability to remain free from injury will improve Outcome: Progressing   Problem: Skin Integrity: Goal: Risk for impaired skin integrity will decrease Outcome: Progressing   

## 2020-08-29 NOTE — Progress Notes (Signed)
Pt received dose of IV lasix today. Unable to keep track of urine volume due to pts incontinence. Purewick has been trailed several times, but pt does not keep it in place. Pt peed many times in bed and once on floor.

## 2020-08-29 NOTE — Progress Notes (Signed)
Daily Progress Note   Patient Name: Kristin Adkins       Date: 08/29/2020 DOB: 02-24-1921  Age: 87 y.o. MRN#: [id number] Attending Physician: Domenic Polite, MD Primary Care Physician: Raelene Bott, MD Admit Date: 08/27/2020  Reason for Consultation/Follow-up: continued [employer] discussions  Subjective: Patient is OOB to recliner. Alert and able to answer simple questions. Her lunch tray is delivered while I am in the room and she is able to feed herself.   Son/Dennis is at bedside. He shares that she walked 85 feet with PT earlier today. We discussed that she has clinically improved since admission with full scope treatment and diuresis; however that underlying issues are still present and pulmonary edema will continue to be a recurrent issue. Discussed that she was in acute respiratory failure on admission requiring BiPAP and that this can be an uncomfortable intervention. [name] agrees it is uncomfortable stating that "she fights it" but at this time he would want her to be placed on BiPAP again if needed.   We discussed her current illness and what it means in the larger context of her ongoing co-morbidities.  Natural disease trajectory of advanced heart failure was discussed. Also discussed that she has stage IV kidney disease (which [name] did not know until this admission) and provided education that the diuretics used to treat heart failure will cause kidney function to worsen.   [name] shares that he is struggling to accept that she is at end of life. Discussed the cycle of repeated hospitalizations and that this will only continue. He shares that she has "turned around" quickly in the past and when he sees her "as she is now" (in her current improved condition), it makes it very difficult for  him to make decisions to not pursue full scope interventions in the future.  Discussed at length the difference between hospice and palliative care services outpatient. I provided education and counseling that patient is certainly eligible for hospice and that this service could be provided at [facility] if her level of care is long-term. Discussed that she could not receive PT/OT while receiving hospice. At this time, [name] is not ready to pursue hospice care. He feels he still needs time to process all the information related to making this decision. However, he does agree to an outpatient palliative referral to check-in  periodically with patient and family and continue goals of care discussions. Discussed that outpatient palliative can also help with a more efficient transition to hospice when he is ready.    Advanced directives, concepts specific to code status, artifical feeding and hydration, and rehospitalization were discussed. I explained the MOST form in detail - answered all questions. [name] declines to complete MOST form at this time, but will reach out to PMT if/when he is ready.    Length of Stay: 2  Current Medications: Scheduled Meds:  . amiodarone  100 mg Oral Daily  . apixaban  2.5 mg Oral BID  . Chlorhexidine Gluconate Cloth  6 each Topical Q0600  . famotidine  10 mg Oral Daily  . [START ON 08/30/2020] furosemide  60 mg Intravenous Daily  . insulin aspart  0-6 Units Subcutaneous TID WC  . LORazepam  0.5 mg Intravenous Once  . mupirocin ointment  1 application Nasal BID  . sodium chloride flush  3 mL Intravenous Q12H     PRN Meds: sodium chloride, acetaminophen, albuterol, sodium chloride flush  Physical Exam Vitals reviewed.  Constitutional:      General: She is not in acute distress.    Comments: Frail and chronically ill-appearing  Cardiovascular:     Rate and Rhythm: Normal rate.  Pulmonary:     Effort: Pulmonary effort is normal.  Neurological:     Mental  Status: She is alert.     Motor: Weakness present.     Comments: Oriented to self  Psychiatric:        Cognition and Memory: Cognition is impaired.             Vital Signs: BP (!) 115/50 (BP Location: Left Arm)   Pulse 71   Temp 97.6 F (36.4 C) (Oral)   Resp 20   Ht '5\' 2"'$  (1.575 m)   Wt 65.8 kg   SpO2 97%   BMI 26.53 kg/m  SpO2: SpO2: 97 % O2 Device: O2 Device: Nasal Cannula O2 Flow Rate: O2 Flow Rate (L/min): 2 L/min  Intake/output summary:   Intake/Output Summary (Last 24 hours) at 08/29/2020 1312 Last data filed at 08/29/2020 [id number] Gross per 24 hour  Intake 3 ml  Output --  Net 3 ml   LBM: Last BM Date: 08/28/20 Baseline Weight: Weight: 68.5 kg Most recent weight: Weight: 65.8 kg       Palliative Assessment/Data: PPS 40%      Palliative Care Assessment & Plan   HPI/Patient Profile: 87 y.o. female  with past medical history of HTN, atrial fibrillation on anticoagulation, MI, COPD, CKD stage IV with solitary kidney, CHF, and recent COVID infection Jan 2022 presented to the ED on 08/27/20 from [place] due to respiratory distress. Patient was admitted on 08/27/2020 with acute respiratory failure with hypoxia likely secondary to CHF with pulmonary edema, acute respiratory failure secondary to recurrent flash pulmonary edema, hypotension, acute on chronic kidney failure.   Of note, this is patient's fifth admission at [facility] since February with most recent admission 3/4-08/15/20, all for the same issue of acute respiratory failure secondary to CHF with pulmonary edema. PMT was consulted on most recent admission and son opted for DNR/DNI and full scope treatment at that time.  ED Course: Patient placed on BiPAP, given Lasix, and low-dose nitroglycerin. She has had significantly decreased blood pressure down to 90. She required 200 cc of normal saline. Patient has been diuresing. Her respiratory status is improved with oxygen saturations increased from  80s to 97%.  She remains tachypneic with some increased work of breathing. Patient has improved from a respiratory status but remains on BiPAP. They feel that station can be admitted to progressive care Hospitalist team and palliative care are being consulted  Assessment: - acute hypoxic respiratory failure - recurrent flash pulmonary edema - acute on chronic systolic CHF - hypotension (improved) - stage IV kidney disease - paroxysmal atrial fibrillation - dementia  Recommendations/Plan:  DNR/DNI as previously documented  Continue current medical treatment  No escalation of care to central line or pressors  No artificial feeding  At this time, son would agree to BiPAP again if needed  Goal of care is to return to [facility] (rehab versus LTC)  Patient would be eligible for hospice if in LTC, however son is not ready for hospice at this time  Son agrees to outpatient palliative follow-up  PMT will continue to follow   Goals of Care and Additional Recommendations:  Limitations on Scope of Treatment: No Artificial Feeding  Code Status: DNR/DNI  Prognosis:   < 6 months  Discharge Planning:  SNF with palliative care outpatient follow-up   Thank you for allowing the Palliative Medicine Team to assist in the care of this patient.   Total Time 67 minutes Prolonged Time Billed  yes       Greater than 50%  of this time was spent counseling and coordinating care related to the above assessment and plan.  Lavena Bullion, NP  Please contact Palliative Medicine Team phone at [phone] for questions and concerns.

## 2020-08-30 DIAGNOSIS — J9601 Acute respiratory failure with hypoxia: Secondary | ICD-10-CM | POA: Diagnosis not present

## 2020-08-30 LAB — BASIC METABOLIC PANEL
Anion gap: 8 (ref 5–15)
BUN: 28 mg/dL — ABNORMAL HIGH (ref 8–23)
CO2: 26 mmol/L (ref 22–32)
Calcium: 9.5 mg/dL (ref 8.9–10.3)
Chloride: 102 mmol/L (ref 98–111)
Creatinine, Ser: 1.94 mg/dL — ABNORMAL HIGH (ref 0.44–1.00)
GFR, Estimated: 23 mL/min — ABNORMAL LOW (ref >60)
Glucose, Bld: 95 mg/dL (ref 70–99)
Potassium: 3.9 mmol/L (ref 3.5–5.1)
Sodium: 136 mmol/L (ref 135–145)

## 2020-08-30 LAB — GLUCOSE, CAPILLARY
Glucose-Capillary: 111 mg/dL — ABNORMAL HIGH (ref 70–99)
Glucose-Capillary: 120 mg/dL — ABNORMAL HIGH (ref 70–99)
Glucose-Capillary: 147 mg/dL — ABNORMAL HIGH (ref 70–99)
Glucose-Capillary: 167 mg/dL — ABNORMAL HIGH (ref 70–99)

## 2020-08-30 MED ORDER — SPIRONOLACTONE 12.5 MG HALF TABLET
12.5000 mg | ORAL_TABLET | Freq: Every day | ORAL | Status: DC
  Administered 2020-08-30 – 2020-08-31 (×2): 12.5 mg via ORAL
  Filled 2020-08-30 (×2): qty 1

## 2020-08-30 MED ORDER — LORAZEPAM 2 MG/ML IJ SOLN
0.5000 mg | Freq: Four times a day (QID) | INTRAMUSCULAR | Status: DC | PRN
  Administered 2020-09-04 (×3): 0.5 mg via INTRAVENOUS
  Filled 2020-08-30 (×2): qty 1

## 2020-08-30 MED ORDER — MELATONIN 3 MG PO TABS
3.0000 mg | ORAL_TABLET | Freq: Every day | ORAL | Status: DC
  Administered 2020-08-30 – 2020-09-04 (×6): 3 mg via ORAL
  Filled 2020-08-30 (×6): qty 1

## 2020-08-30 NOTE — Plan of Care (Signed)
  Problem: Coping: Goal: Level of anxiety will decrease Outcome: Progressing   Problem: Elimination: Goal: Will not experience complications related to bowel motility Outcome: Progressing   Problem: Elimination: Goal: Will not experience complications related to urinary retention Outcome: Progressing

## 2020-08-30 NOTE — Care Management Important Message (Signed)
Important Message  Patient Details  Name: Kristin Adkins MRN: [id number] Date of Birth: 17-Nov-1920   Medicare Important Message Given:  Yes     Orbie Pyo 08/30/2020, 2:59 PM

## 2020-08-30 NOTE — Progress Notes (Signed)
PROGRESS NOTE    Kristin Adkins  [id number] DOB: May 28, 1921 DOA: 08/27/2020 PCP: Raelene Bott, MD    Brief Narrative:  Kristin Adkins is a 87 year old female with past medical history significant for chronic combined systolic and diastolic congestive heart failure, hypertension, paroxysmal atrial fibrillation, CKD stage IV, solitary kidney, dementia who presented to the ED with acute onset shortness of breath.  Is the patient's fifth presentation since February 5 for pulmonary edema.  Most recently discharged 16 days ago after which she was apparently doing well at collapse nursing home until this morning when she was found to be in respiratory distress.  EMS was called and patient was placed on BiPAP after treatment with nitroglycerin.  Patient unable to provide history due to dementia.  In the ED, patient was placed on BiPAP, given nitroglycerin and Lasix 80 mg IV x1.  Patient was found to be hypotensive thereafter but responded well to a fluid bolus.  Goals of care were discussed with patient's son by ED physician as well as PCCM.  Patient is a DNR and son agrees to admit patient to progressive unit given no further escalation of care with no intubation or pressors.  Hospitalist service consulted for further evaluation and management.   Assessment & Plan:   Principal Problem:   Acute respiratory failure with hypoxia (HCC) Active Problems:   Acute on chronic combined systolic and diastolic CHF (congestive heart failure) (HCC)   DNR (do not resuscitate)   AF (paroxysmal atrial fibrillation) (HCC)   Chronic anticoagulation   Transient hypotension   Type 2 diabetes mellitus without complication ([place])   Acute kidney injury superimposed on chronic kidney disease (HCC)   Acute hypoxic respiratory failure, POA Acute on chronic combined systolic and diastolic congestive heart failure Recurrent flash pulmonary edema TTE with LVEF 30-35% with grade 2 diastolic dysfunction.  This is  also complicated by her advanced stage IV kidney disease as well as likely underlying significant coronary artery disease.  However given her advanced age, renal function and frailty not a candidate for invasive work-up.  Home regimen includes torsemide 20 mg p.o. daily, spironolactone 12.5 mg p.o. daily, imdur 30 mg p.o. daily hydralazine 10 mg p.o. 3 times daily. --wt 68.5>65.8kg on 3/21; no weight since --Continue furosemide 60 mg IV daily --Restart spironolactone 12.5 mg p.o. daily --Continue supplemental oxygen, wean to maintain SPO2 greater than 92%, on 2 L nasal cannula --Strict I's and O's and daily weights --Repeat BMP in a.m.  Essential hypertension BP 126/46 this morning, well controlled. --currently holding home Imdur 30 mg p.o. daily, hydralazine 10 mg p.o. TID --restart spironolactone 12.5 mg p.o. daily today --IV furosemide 60 mg daily as above --Continue monitor BP closely  Paroxysmal atrial fibrillation --Amiodarone 100 mg p.o. daily --Eliquis 2.5 mg p.o. twice daily  Type 2 diabetes mellitus poorly controlled with hyperglycemia Hemoglobin A1c 11.8, poorly controlled.  Apparently diet controlled at home. --Very sensitive SSI for coverage --CBGs before every meal/at bedtime  CKD stage IV Solitary kidney Baseline creatinine 2.0-2.5, stable.  On IV Lasix as above. --Creatinine 2.39>2.08>1.94 today --BMP daily  Pressure injury sacrum, heels, nose, POA --Continue local wound care, offloading  Dementia --Delirium precautions --Get up during the day --Encourage a familiar face to remain present throughout the day --Keep blinds open and lights on during daylight hours --Ativan 0.5 mg IV every 6 hours as needed for agitation --Melatonin 3 mg nightly   DVT prophylaxis: Eliquis   Code Status: DNR Family Communication: Updated patient's son,  Dennis via telephone this afternoon  Disposition Plan:  Level of care: Telemetry Medical Status is: Inpatient  Remains  inpatient appropriate because:Ongoing diagnostic testing needed not appropriate for outpatient work up, Unsafe d/c plan, IV treatments appropriate due to intensity of illness or inability to take PO and Inpatient level of care appropriate due to severity of illness   Dispo: The patient is from: SNF              Anticipated d/c is to: SNF              Patient currently is not medically stable to d/c.   Difficult to place patient No  Consultants:   PCCM  Palliative care  Procedures:   BiPAP  Antimicrobials:   none   Subjective: Patient seen and examined at bedside, resting comfortably.  Pleasantly confused.  Breathing is much better than when she first arrived.  No family present at bedside.  Continues on 2 L nasal cannula.  No other specific complaints or concerns at this time.  Denies headache, chest pain, no abdominal pain, no fever, no cough.  No acute concerns overnight per nursing staff.  Objective: Vitals:   08/29/20 2028 08/30/20 0514 08/30/20 0949 08/30/20 1213  BP: (!) 115/55 (!) 126/46 111/72 (!) 126/52  Pulse: 74 81 79 84  Resp: '18 18 15 20  '$ Temp: 075-GRM F (624THL C) 98.2 F (36.8 C) 98.9 F (37.2 C) 97.7 F (36.5 C)  TempSrc: Oral Oral Oral Oral  SpO2: 94% 94% 94% 96%  Weight:      Height:        Intake/Output Summary (Last 24 hours) at 08/30/2020 1458 Last data filed at 08/30/2020 0310 Gross per 24 hour  Intake --  Output 200 ml  Net -200 ml   Filed Weights   08/27/20 0817 08/27/20 1607 08/28/20 0314  Weight: 68.5 kg 68.9 kg 65.8 kg    Examination:  General exam: Appears calm and comfortable, pleasantly confused, thin in appearance Respiratory system: Clear to auscultation. Respiratory effort normal.  On 2 L nasal cannula Cardiovascular system: S1 & S2 heard, RRR. No JVD, murmurs, rubs, gallops or clicks. No pedal edema. Gastrointestinal system: Abdomen is nondistended, soft and nontender. No organomegaly or masses felt. Normal bowel sounds  heard. Central nervous system: Alert, not oriented to person/place/time/situation. No focal neurological deficits. Extremities: Symmetric 5 x 5 power. Skin: No rashes, lesions or ulcers Psychiatry: Judgement and insight appear poor. Mood & affect appropriate.     Data Reviewed: I have personally reviewed following labs and imaging studies  CBC: Recent Labs  Lab 08/27/20 0748 08/27/20 0804 08/29/20 0039  WBC 15.4*  --  5.4  HGB 12.9 13.6 11.8*  HCT 42.9 40.0 35.7*  MCV 91.9  --  85.6  PLT 202  --  [id number]   Basic Metabolic Panel: Recent Labs  Lab 08/24/20 1600 08/27/20 0748 08/27/20 0804 08/28/20 0137 08/29/20 0039 08/30/20 0246  NA 139 132* 134* 136 134* 136  K 4.5 5.0 5.1 5.2* 4.0 3.9  CL 102 100  --  104 101 102  CO2 20 19*  --  '23 22 26  '$ GLUCOSE 136* 300*  --  88 108* 95  BUN 24 25*  --  29* 29* 28*  CREATININE 1.94* 2.39*  --  2.25* 2.08* 1.94*  CALCIUM 9.6 9.6  --  9.5 9.3 9.5  MG  --   --   --  2.2  --   --  GFR: Estimated Creatinine Clearance: 14.8 mL/min (A) (by C-G formula based on SCr of 1.94 mg/dL (H)). Liver Function Tests: Recent Labs  Lab 08/27/20 0748  AST 93*  ALT 60*  ALKPHOS 100  BILITOT 0.8  PROT 6.8  ALBUMIN 3.2*   No results for input(s): LIPASE, AMYLASE in the last 168 hours. No results for input(s): AMMONIA in the last 168 hours. Coagulation Profile: No results for input(s): INR, PROTIME in the last 168 hours. Cardiac Enzymes: No results for input(s): CKTOTAL, CKMB, CKMBINDEX, TROPONINI in the last 168 hours. BNP (last 3 results) No results for input(s): PROBNP in the last 8760 hours. HbA1C: No results for input(s): HGBA1C in the last 72 hours. CBG: Recent Labs  Lab 08/29/20 1127 08/29/20 1701 08/29/20 2036 08/30/20 0802 08/30/20 1214  GLUCAP 109* 105* 142* 111* 120*   Lipid Profile: No results for input(s): CHOL, HDL, LDLCALC, TRIG, CHOLHDL, LDLDIRECT in the last 72 hours. Thyroid Function Tests: No results for  input(s): TSH, T4TOTAL, FREET4, T3FREE, THYROIDAB in the last 72 hours. Anemia Panel: No results for input(s): VITAMINB12, FOLATE, FERRITIN, TIBC, IRON, RETICCTPCT in the last 72 hours. Sepsis Labs: No results for input(s): PROCALCITON, LATICACIDVEN in the last 168 hours.  Recent Results (from the past 240 hour(s))  Resp Panel by RT-PCR (Flu A&B, Covid) Nasopharyngeal Swab     Status: None   Collection Time: 08/27/20  8:29 AM   Specimen: Nasopharyngeal Swab; Nasopharyngeal(NP) swabs in vial transport medium  Result Value Ref Range Status   SARS Coronavirus 2 by RT PCR NEGATIVE NEGATIVE Final    Comment: (NOTE) SARS-CoV-2 target nucleic acids are NOT DETECTED.  The SARS-CoV-2 RNA is generally detectable in upper respiratory specimens during the acute phase of infection. The lowest concentration of SARS-CoV-2 viral copies this assay can detect is 138 copies/mL. A negative result does not preclude SARS-Cov-2 infection and should not be used as the sole basis for treatment or other patient management decisions. A negative result may occur with  improper specimen collection/handling, submission of specimen other than nasopharyngeal swab, presence of viral mutation(s) within the areas targeted by this assay, and inadequate number of viral copies(<138 copies/mL). A negative result must be combined with clinical observations, patient history, and epidemiological information. The expected result is Negative.  Fact Sheet for Patients:  EntrepreneurPulse.com.au  Fact Sheet for Healthcare Providers:  IncredibleEmployment.be  This test is no t yet approved or cleared by the [place] FDA and  has been authorized for detection and/or diagnosis of SARS-CoV-2 by FDA under an Emergency Use Authorization (EUA). This EUA will remain  in effect (meaning this test can be used) for the duration of the COVID-19 declaration under Section 564(b)(1) of the Act,  21 U.S.C.section 360bbb-3(b)(1), unless the authorization is terminated  or revoked sooner.       Influenza A by PCR NEGATIVE NEGATIVE Final   Influenza B by PCR NEGATIVE NEGATIVE Final    Comment: (NOTE) The Xpert Xpress SARS-CoV-2/FLU/RSV plus assay is intended as an aid in the diagnosis of influenza from Nasopharyngeal swab specimens and should not be used as a sole basis for treatment. Nasal washings and aspirates are unacceptable for Xpert Xpress SARS-CoV-2/FLU/RSV testing.  Fact Sheet for Patients: EntrepreneurPulse.com.au  Fact Sheet for Healthcare Providers: IncredibleEmployment.be  This test is not yet approved or cleared by the [place] FDA and has been authorized for detection and/or diagnosis of SARS-CoV-2 by FDA under an Emergency Use Authorization (EUA). This EUA will remain in effect (meaning this  test can be used) for the duration of the COVID-19 declaration under Section 564(b)(1) of the Act, 21 U.S.C. section 360bbb-3(b)(1), unless the authorization is terminated or revoked.  Performed at [facility], [place] [address] [place], [place] [address]   MRSA PCR Screening     Status: Abnormal   Collection Time: 08/28/20  5:25 AM   Specimen: Nasopharyngeal  Result Value Ref Range Status   MRSA by PCR POSITIVE (A) NEGATIVE Final    Comment:        The GeneXpert MRSA Assay (FDA approved for NASAL specimens only), is one component of a comprehensive MRSA colonization surveillance program. It is not intended to diagnose MRSA infection nor to guide or monitor treatment for MRSA infections. RESULT CALLED TO, READ BACK BY AND VERIFIED WITH: RN L.TYSON AT 0855 ON 08/2020 BY T.SAAD Performed at [facility], [place] [address], [place], [place] [address]          Radiology Studies: No results found.      Scheduled Meds: . amiodarone  100 mg Oral Daily  . apixaban  2.5 mg Oral BID  . Chlorhexidine  Gluconate Cloth  6 each Topical Q0600  . famotidine  10 mg Oral Daily  . furosemide  60 mg Intravenous Daily  . insulin aspart  0-6 Units Subcutaneous TID WC  . LORazepam  0.5 mg Intravenous Once  . mupirocin ointment  1 application Nasal BID  . sodium chloride flush  3 mL Intravenous Q12H   Continuous Infusions: . sodium chloride       LOS: 3 days    Time spent: 39 minutes spent on chart review, discussion with nursing staff, consultants, updating family and interview/physical exam; more than 50% of that time was spent in counseling and/or coordination of care.    Safiyya Stokes J [place] DO [facility] Available via Epic secure chat 7am-7pm After these hours, please refer to coverage provider listed on [email or web address] 08/30/2020, 2:58 PM

## 2020-08-30 NOTE — Plan of Care (Signed)

## 2020-08-31 DIAGNOSIS — J9601 Acute respiratory failure with hypoxia: Secondary | ICD-10-CM | POA: Diagnosis not present

## 2020-08-31 LAB — BASIC METABOLIC PANEL
Anion gap: 7 (ref 5–15)
BUN: 30 mg/dL — ABNORMAL HIGH (ref 8–23)
CO2: 28 mmol/L (ref 22–32)
Calcium: 9.5 mg/dL (ref 8.9–10.3)
Chloride: 100 mmol/L (ref 98–111)
Creatinine, Ser: 2.16 mg/dL — ABNORMAL HIGH (ref 0.44–1.00)
GFR, Estimated: 20 mL/min — ABNORMAL LOW (ref >60)
Glucose, Bld: 110 mg/dL — ABNORMAL HIGH (ref 70–99)
Potassium: 3.6 mmol/L (ref 3.5–5.1)
Sodium: 135 mmol/L (ref 135–145)

## 2020-08-31 LAB — SARS CORONAVIRUS 2 (TAT 6-24 HRS): SARS Coronavirus 2: NEGATIVE

## 2020-08-31 LAB — MAGNESIUM: Magnesium: 1.8 mg/dL (ref 1.7–2.4)

## 2020-08-31 LAB — GLUCOSE, CAPILLARY
Glucose-Capillary: 113 mg/dL — ABNORMAL HIGH (ref 70–99)
Glucose-Capillary: 117 mg/dL — ABNORMAL HIGH (ref 70–99)
Glucose-Capillary: 159 mg/dL — ABNORMAL HIGH (ref 70–99)
Glucose-Capillary: 141 mg/dL — ABNORMAL HIGH (ref 70–99)

## 2020-08-31 MED ORDER — TORSEMIDE 20 MG PO TABS
20.0000 mg | ORAL_TABLET | Freq: Every day | ORAL | Status: DC
  Administered 2020-08-31 – 2020-09-04 (×5): 20 mg via ORAL
  Filled 2020-08-31 (×6): qty 1

## 2020-08-31 MED ORDER — ISOSORBIDE MONONITRATE ER 30 MG PO TB24
15.0000 mg | ORAL_TABLET | Freq: Every day | ORAL | Status: DC
  Administered 2020-08-31 – 2020-09-03 (×4): 15 mg via ORAL
  Filled 2020-08-31 (×4): qty 1

## 2020-08-31 NOTE — Plan of Care (Signed)

## 2020-08-31 NOTE — TOC Progression Note (Addendum)
Transition of Care [facility]) - Progression Note    Patient Details  Name: Michele Lucking MRN: [id number] Date of Birth: 1921/02/02  [facility] CM/SW Contact  Angelita Ingles, RN Phone Number: [phone]  08/31/2020, 12:04 PM  Clinical Narrative:    CM attempted to contact admissions coordinator at [facility] to determine if patient is ok to return to facility. Message has been left for Cyndia Skeeters.   1500 CM spoke with Olivia Mackie at [facility]. Olivia Mackie confirms that patient is ok to return to facility.        Expected Discharge Plan and Services                                                 Social Determinants of Health (SDOH) Interventions    Readmission Risk Interventions No flowsheet data found.

## 2020-08-31 NOTE — Progress Notes (Signed)
PROGRESS NOTE    Kristin Adkins  [id number] DOB: 11-06-1920 DOA: 08/27/2020 PCP: Raelene Bott, MD    Brief Narrative:  Kristin Adkins is a 87 year old female with past medical history significant for chronic combined systolic and diastolic congestive heart failure, hypertension, paroxysmal atrial fibrillation, CKD stage IV, solitary kidney, dementia who presented to the ED with acute onset shortness of breath.  Is the patient's fifth presentation since February 5 for pulmonary edema.  Most recently discharged 16 days ago after which she was apparently doing well at collapse nursing home until this morning when she was found to be in respiratory distress.  EMS was called and patient was placed on BiPAP after treatment with nitroglycerin.  Patient unable to provide history due to dementia.  In the ED, patient was placed on BiPAP, given nitroglycerin and Lasix 80 mg IV x1.  Patient was found to be hypotensive thereafter but responded well to a fluid bolus.  Goals of care were discussed with patient's son by ED physician as well as PCCM.  Patient is a DNR and son agrees to admit patient to progressive unit given no further escalation of care with no intubation or pressors.  Hospitalist service consulted for further evaluation and management.   Assessment & Plan:   Principal Problem:   Acute respiratory failure with hypoxia (HCC) Active Problems:   Acute on chronic combined systolic and diastolic CHF (congestive heart failure) (HCC)   DNR (do not resuscitate)   AF (paroxysmal atrial fibrillation) (HCC)   Chronic anticoagulation   Transient hypotension   Type 2 diabetes mellitus without complication ([place])   Acute kidney injury superimposed on chronic kidney disease (HCC)   Acute hypoxic respiratory failure, POA Acute on chronic combined systolic and diastolic congestive heart failure Recurrent flash pulmonary edema TTE with LVEF 30-35% with grade 2 diastolic dysfunction.  This is  also complicated by her advanced stage IV kidney disease as well as likely underlying significant coronary artery disease.  However given her advanced age, renal function and frailty not a candidate for invasive work-up.  Home regimen includes torsemide 20 mg p.o. daily, spironolactone 12.5 mg p.o. daily, imdur 30 mg p.o. daily hydralazine 10 mg p.o. 3 times daily. --wt 68.5>65.8kg on 3/21; no weight since --Transition IV furosemide to torsemide 20 mg p.o. daily today --spironolactone 12.5 mg p.o. daily --Isosorbide mononitrate 15 mg p.o. daily --Continue supplemental oxygen, wean to maintain SPO2 greater than 92%, on 2 L nasal cannula with SPO2 99%, wean to room air today if able --Strict I's and O's and daily weights --Repeat BMP in a.m.  Essential hypertension BP 126/46 this morning, well controlled. --currently holding home Imdur 30 mg p.o. daily, hydralazine 10 mg p.o. TID --spironolactone 12.5 mg p.o. daily today --Torsemide 20 mg p.o. daily --Isosorbide mononitrate 50 mg p.o. daily --Continue monitor BP closely  Paroxysmal atrial fibrillation --Amiodarone 100 mg p.o. daily --Eliquis 2.5 mg p.o. twice daily  Type 2 diabetes mellitus poorly controlled with hyperglycemia Hemoglobin A1c 11.8, poorly controlled.  Apparently diet controlled at home. --Very sensitive SSI for coverage --CBGs before every meal/at bedtime  CKD stage IV Solitary kidney Baseline creatinine 2.0-2.5, stable.  On IV Lasix as above. --Creatinine 2.39>2.08>1.94>2.16 today --BMP daily  Pressure injury sacrum, heels, nose, POA --Continue local wound care, offloading  Dementia --Delirium precautions --Get up during the day --Encourage a familiar face to remain present throughout the day --Keep blinds open and lights on during daylight hours --Ativan 0.5 mg IV every 6 hours as  needed for agitation --Melatonin 3 mg nightly   DVT prophylaxis: Eliquis   Code Status: DNR Family Communication: Updated  patient's son, [name] via telephone yesterday afternoon  Disposition Plan:  Level of care: Telemetry Medical Status is: Inpatient  Remains inpatient appropriate because:Ongoing diagnostic testing needed not appropriate for outpatient work up, Unsafe d/c plan, IV treatments appropriate due to intensity of illness or inability to take PO and Inpatient level of care appropriate due to severity of illness   Dispo: The patient is from: SNF [facility]              Anticipated d/c is to: SNF [facility]              Patient currently is not medically stable to d/c.   Difficult to place patient No  Consultants:   PCCM  Palliative care  Procedures:   BiPAP  Antimicrobials:   none   Subjective: Patient seen and examined at bedside, resting comfortably.  Sitting in bedside chair.  Pleasantly confused.  Shortness of breath resolved.  No family present at bedside.  Continues on 2 L nasal cannula with SPO2 99%.  No other specific complaints or concerns at this time.  Denies headache, chest pain, no abdominal pain, no fever, no cough.  No acute concerns overnight per nursing staff.  Objective: Vitals:   08/30/20 1213 08/30/20 2000 08/31/20 0457 08/31/20 0904  BP: (!) 126/52 (!) 126/51 125/62 124/61  Pulse: 84 78 69 79  Resp: '20 18 18 15  '$ Temp: 97.7 F (36.5 C) 98 F (36.7 C) 97.9 F (36.6 C) 98.5 F (36.9 C)  TempSrc: Oral Oral Oral Oral  SpO2: 96% 95% 96% 99%  Weight:      Height:        Intake/Output Summary (Last 24 hours) at 08/31/2020 1150 Last data filed at 08/30/2020 2042 Gross per 24 hour  Intake --  Output 300 ml  Net -300 ml   Filed Weights   08/27/20 0817 08/27/20 1607 08/28/20 0314  Weight: 68.5 kg 68.9 kg 65.8 kg    Examination:  General exam: Appears calm and comfortable, pleasantly confused, thin in appearance Respiratory system: Clear to auscultation. Respiratory effort normal.  On 2 L nasal cannula with SPO2 99% Cardiovascular system: S1 & S2 heard, RRR. No  JVD, murmurs, rubs, gallops or clicks. No pedal edema. Gastrointestinal system: Abdomen is nondistended, soft and nontender. No organomegaly or masses felt. Normal bowel sounds heard. Central nervous system: Alert, not oriented to person/place/time/situation. No focal neurological deficits. Extremities: Symmetric 5 x 5 power. Skin: No rashes, lesions or ulcers Psychiatry: Judgement and insight appear poor. Mood & affect appropriate.     Data Reviewed: I have personally reviewed following labs and imaging studies  CBC: Recent Labs  Lab 08/27/20 0748 08/27/20 0804 08/29/20 0039  WBC 15.4*  --  5.4  HGB 12.9 13.6 11.8*  HCT 42.9 40.0 35.7*  MCV 91.9  --  85.6  PLT 202  --  [id number]   Basic Metabolic Panel: Recent Labs  Lab 08/27/20 0748 08/27/20 0804 08/28/20 0137 08/29/20 0039 08/30/20 0246 08/31/20 0314  NA 132* 134* 136 134* 136 135  K 5.0 5.1 5.2* 4.0 3.9 3.6  CL 100  --  104 101 102 100  CO2 19*  --  '23 22 26 28  '$ GLUCOSE 300*  --  88 108* 95 110*  BUN 25*  --  29* 29* 28* 30*  CREATININE 2.39*  --  2.25* 2.08* 1.94* 2.16*  CALCIUM 9.6  --  9.5 9.3 9.5 9.5  MG  --   --  2.2  --   --  1.8   GFR: Estimated Creatinine Clearance: 13.3 mL/min (A) (by C-G formula based on SCr of 2.16 mg/dL (H)). Liver Function Tests: Recent Labs  Lab 08/27/20 0748  AST 93*  ALT 60*  ALKPHOS 100  BILITOT 0.8  PROT 6.8  ALBUMIN 3.2*   No results for input(s): LIPASE, AMYLASE in the last 168 hours. No results for input(s): AMMONIA in the last 168 hours. Coagulation Profile: No results for input(s): INR, PROTIME in the last 168 hours. Cardiac Enzymes: No results for input(s): CKTOTAL, CKMB, CKMBINDEX, TROPONINI in the last 168 hours. BNP (last 3 results) No results for input(s): PROBNP in the last 8760 hours. HbA1C: No results for input(s): HGBA1C in the last 72 hours. CBG: Recent Labs  Lab 08/30/20 0802 08/30/20 1214 08/30/20 1604 08/30/20 2055 08/31/20 0748  GLUCAP 111*  120* 147* 167* 113*   Lipid Profile: No results for input(s): CHOL, HDL, LDLCALC, TRIG, CHOLHDL, LDLDIRECT in the last 72 hours. Thyroid Function Tests: No results for input(s): TSH, T4TOTAL, FREET4, T3FREE, THYROIDAB in the last 72 hours. Anemia Panel: No results for input(s): VITAMINB12, FOLATE, FERRITIN, TIBC, IRON, RETICCTPCT in the last 72 hours. Sepsis Labs: No results for input(s): PROCALCITON, LATICACIDVEN in the last 168 hours.  Recent Results (from the past 240 hour(s))  Resp Panel by RT-PCR (Flu A&B, Covid) Nasopharyngeal Swab     Status: None   Collection Time: 08/27/20  8:29 AM   Specimen: Nasopharyngeal Swab; Nasopharyngeal(NP) swabs in vial transport medium  Result Value Ref Range Status   SARS Coronavirus 2 by RT PCR NEGATIVE NEGATIVE Final    Comment: (NOTE) SARS-CoV-2 target nucleic acids are NOT DETECTED.  The SARS-CoV-2 RNA is generally detectable in upper respiratory specimens during the acute phase of infection. The lowest concentration of SARS-CoV-2 viral copies this assay can detect is 138 copies/mL. A negative result does not preclude SARS-Cov-2 infection and should not be used as the sole basis for treatment or other patient management decisions. A negative result may occur with  improper specimen collection/handling, submission of specimen other than nasopharyngeal swab, presence of viral mutation(s) within the areas targeted by this assay, and inadequate number of viral copies(<138 copies/mL). A negative result must be combined with clinical observations, patient history, and epidemiological information. The expected result is Negative.  Fact Sheet for Patients:  EntrepreneurPulse.com.au  Fact Sheet for Healthcare Providers:  IncredibleEmployment.be  This test is no t yet approved or cleared by the [place] FDA and  has been authorized for detection and/or diagnosis of SARS-CoV-2 by FDA under an Emergency Use  Authorization (EUA). This EUA will remain  in effect (meaning this test can be used) for the duration of the COVID-19 declaration under Section 564(b)(1) of the Act, 21 U.S.C.section 360bbb-3(b)(1), unless the authorization is terminated  or revoked sooner.       Influenza A by PCR NEGATIVE NEGATIVE Final   Influenza B by PCR NEGATIVE NEGATIVE Final    Comment: (NOTE) The Xpert Xpress SARS-CoV-2/FLU/RSV plus assay is intended as an aid in the diagnosis of influenza from Nasopharyngeal swab specimens and should not be used as a sole basis for treatment. Nasal washings and aspirates are unacceptable for Xpert Xpress SARS-CoV-2/FLU/RSV testing.  Fact Sheet for Patients: EntrepreneurPulse.com.au  Fact Sheet for Healthcare Providers: IncredibleEmployment.be  This test is not yet approved or cleared by the [place]  FDA and has been authorized for detection and/or diagnosis of SARS-CoV-2 by FDA under an Emergency Use Authorization (EUA). This EUA will remain in effect (meaning this test can be used) for the duration of the COVID-19 declaration under Section 564(b)(1) of the Act, 21 U.S.C. section 360bbb-3(b)(1), unless the authorization is terminated or revoked.  Performed at [facility], [place] [address], [place], [place] [address]   MRSA PCR Screening     Status: Abnormal   Collection Time: 08/28/20  5:25 AM   Specimen: Nasopharyngeal  Result Value Ref Range Status   MRSA by PCR POSITIVE (A) NEGATIVE Final    Comment:        The GeneXpert MRSA Assay (FDA approved for NASAL specimens only), is one component of a comprehensive MRSA colonization surveillance program. It is not intended to diagnose MRSA infection nor to guide or monitor treatment for MRSA infections. RESULT CALLED TO, READ BACK BY AND VERIFIED WITH: RN L.TYSON AT 0855 ON 08/2020 BY T.SAAD Performed at [facility], [place] [address] [place], [place]  [address]          Radiology Studies: No results found.      Scheduled Meds: . amiodarone  100 mg Oral Daily  . apixaban  2.5 mg Oral BID  . Chlorhexidine Gluconate Cloth  6 each Topical Q0600  . famotidine  10 mg Oral Daily  . insulin aspart  0-6 Units Subcutaneous TID WC  . isosorbide mononitrate  15 mg Oral Daily  . LORazepam  0.5 mg Intravenous Once  . melatonin  3 mg Oral QHS  . mupirocin ointment  1 application Nasal BID  . sodium chloride flush  3 mL Intravenous Q12H  . spironolactone  12.5 mg Oral Daily  . torsemide  20 mg Oral Daily   Continuous Infusions: . sodium chloride       LOS: 4 days    Time spent: 39 minutes spent on chart review, discussion with nursing staff, consultants, updating family and interview/physical exam; more than 50% of that time was spent in counseling and/or coordination of care.    Vetta Couzens J [place] DO [facility] Available via Epic secure chat 7am-7pm After these hours, please refer to coverage provider listed on [email or web address] 08/31/2020, 11:50 AM

## 2020-08-31 NOTE — Plan of Care (Signed)
  Problem: Pain Managment: Goal: General experience of comfort will improve Outcome: Progressing   Problem: Safety: Goal: Ability to remain free from injury will improve Outcome: Progressing   Problem: Education: Goal: Knowledge of General Education information will improve Description: Including pain rating scale, medication(s)/side effects and non-pharmacologic comfort measures Outcome: Not Progressing  A/O to self

## 2020-09-01 DIAGNOSIS — J9601 Acute respiratory failure with hypoxia: Secondary | ICD-10-CM | POA: Diagnosis not present

## 2020-09-01 LAB — BASIC METABOLIC PANEL
Anion gap: 10 (ref 5–15)
BUN: 37 mg/dL — ABNORMAL HIGH (ref 8–23)
CO2: 25 mmol/L (ref 22–32)
Calcium: 9.3 mg/dL (ref 8.9–10.3)
Chloride: 101 mmol/L (ref 98–111)
Creatinine, Ser: 2.33 mg/dL — ABNORMAL HIGH (ref 0.44–1.00)
GFR, Estimated: 19 mL/min — ABNORMAL LOW (ref >60)
Glucose, Bld: 113 mg/dL — ABNORMAL HIGH (ref 70–99)
Potassium: 3.8 mmol/L (ref 3.5–5.1)
Sodium: 136 mmol/L (ref 135–145)

## 2020-09-01 LAB — GLUCOSE, CAPILLARY
Glucose-Capillary: 137 mg/dL — ABNORMAL HIGH (ref 70–99)
Glucose-Capillary: 116 mg/dL — ABNORMAL HIGH (ref 70–99)
Glucose-Capillary: 111 mg/dL — ABNORMAL HIGH (ref 70–99)
Glucose-Capillary: 141 mg/dL — ABNORMAL HIGH (ref 70–99)

## 2020-09-01 LAB — MAGNESIUM: Magnesium: 1.9 mg/dL (ref 1.7–2.4)

## 2020-09-01 MED ORDER — ISOSORBIDE MONONITRATE ER 30 MG PO TB24: 15.0000 mg | ORAL_TABLET | Freq: Every day | ORAL | Status: DC

## 2020-09-01 MED ORDER — SPIRONOLACTONE 25 MG PO TABS: 25.0000 mg | ORAL_TABLET | Freq: Every day | ORAL | Status: DC

## 2020-09-01 MED ORDER — SPIRONOLACTONE 25 MG PO TABS
25.0000 mg | ORAL_TABLET | Freq: Every day | ORAL | Status: DC
  Administered 2020-09-01 – 2020-09-03 (×3): 25 mg via ORAL
  Filled 2020-09-01 (×3): qty 1

## 2020-09-01 NOTE — TOC Progression Note (Addendum)
Transition of Care [facility]) - Progression Note    Patient Details  Name: Bular Hiraoka MRN: [id number] Date of Birth: 1921/01/01  Transition of Care [facility]) CM/SW [place], RN Phone Number: [phone]  09/01/2020, 11:20 AM  Clinical Narrative:    CM received call from [facility] at [facility]. Olivia Mackie has reviewed discharge summary and determined that patient will return under SNF/ rehab versus long term  and will need insurance auth. Insurance Josem Kaufmann has been initiated. Clinicals have been faxed to [facility]. Ref ID # [id number] CM received call from [facility] to make CM aware that patient had SNF stay from 3/8-3/20 and transitioned to long term care on the day of admission to the hospital. Navi rep [facility]) is not sure that patient will qualify for SNF. Info will be sent to physician. CM will await return call on determination  1645 CM spoke with Olivia Mackie at [facility]. Olivia Mackie states that we will need to wait on decision from insurance auth before patient can discharge to [facility]. Per Olivia Mackie the facility will accept her either way but we do need to wait on final decision.       Expected Discharge Plan and Services           Expected Discharge Date: 09/01/20                                     Social Determinants of Health (SDOH) Interventions    Readmission Risk Interventions No flowsheet data found.

## 2020-09-01 NOTE — Discharge Summary (Addendum)
Physician Discharge Summary  Kristin Adkins [id number] DOB: [birth year] DOA: 08/27/2020  PCP: Kristin Bott, MD  Admit date: 08/27/2020 Discharge date: 09/01/2020  [facility]  Disposition:  [facility]   Recommendations for Outpatient Follow-up:  1. Follow up with PCP in 1-2 weeks 2. Increase spironolactone to 25 mg p.o. daily 3. Decreased isosorbide mononitrate to 15 mg p.o. daily 4. May need to consider hospice in the near future given her advanced heart failure with advanced age and chronic comorbidities with multiple hospitalizations  Home Health: No Equipment/Devices: None  Discharge Condition: Stable CODE STATUS: DNR Diet recommendation: Heart healthy diet, 1200 mL fluid restriction  History of present illness:  Kristin Adkins is a 87 year old female with past medical history significant for chronic combined systolic and diastolic congestive heart failure, hypertension, paroxysmal atrial fibrillation, CKD stage IV, solitary kidney, dementia who presented to the ED with acute onset shortness of breath.  Is the patient's fifth presentation since February 5 for pulmonary edema.  Most recently discharged 16 days ago after which [facility] until this morning when she was found to be in respiratory distress.  EMS was called and patient was placed on BiPAP after treatment with nitroglycerin.  Patient unable to provide history due to dementia.  In the ED, patient was placed on BiPAP, given nitroglycerin and Lasix 80 mg IV x1.  Patient was found to be hypotensive thereafter but responded well to a fluid bolus.  Goals of care were discussed with patient's son by ED physician as well as PCCM.  Patient is a DNR and son agrees to admit patient to progressive unit given no further escalation of care with no intubation or pressors.  Hospitalist service consulted for further evaluation and management.  Hospital course:  Acute hypoxic  respiratory failure, POA Acute on chronic combined systolic and diastolic congestive heart failure Recurrent flash pulmonary edema TTE with LVEF 30-35% with grade 2 diastolic dysfunction.  This is also complicated by her advanced stage IV kidney disease as well as likely underlying significant coronary artery disease.  However given her advanced age, renal function and frailty not a candidate for invasive work-up.  Home regimen includes torsemide 20 mg p.o. daily, spironolactone 12.5 mg p.o. daily, imdur 30 mg p.o. daily hydralazine 10 mg p.o. 3 times daily.  Patient was initially started on IV furosemide with good diuresis and was able to be titrated off of supplemental oxygen.  Patient will resume her home torsemide 20 mg p.o. daily, and hydralazine 10 mg p.o. 3 times daily.  Isosorbide mononitrate dose reduced to 50 mg p.o. daily.  Spironolactone was increased to 25 mg p.o. daily.  Recommend continue to monitor daily weights, limits fluids to 1200 mL/day.  Outpatient follow-up with PCP/cardiology.  May need to consider hospice in the near future.  Essential hypertension Continue torsemide 20 mg p.o. daily, isosorbide mononitrate 15 mg p.o. daily, spironolactone 25 mg p.o. daily, hydralazine 10 mg p.o. 3 times daily.  Paroxysmal atrial fibrillation Amiodarone 100 mg p.o. daily, Eliquis 2.5 mg p.o. twice daily  Type 2 diabetes mellitus poorly controlled with hyperglycemia Hemoglobin A1c 11.8, poorly controlled.  Apparently diet controlled at home.  Outpatient follow-up with PCP.  CKD stage IV Solitary kidney Baseline creatinine 2.0-2.5, stable.  On IV Lasix as above.  Creatinine 2.33 at time of discharge.  Pressure injury sacrum, heels, nose, POA Continue local wound care, offloading  Dementia Supportive care  Discharge Diagnoses:  Active Problems:  Acute on chronic combined systolic and diastolic CHF (congestive heart failure) (HCC)   DNR (do not resuscitate)   AF (paroxysmal  atrial fibrillation) (HCC)   Chronic anticoagulation   Type 2 diabetes mellitus without complication ([facility])   Acute kidney injury superimposed on chronic kidney disease [facility])    Discharge Instructions  Discharge Instructions    (HEART FAILURE PATIENTS) Call MD:  Anytime you have any of the following symptoms: 1) 3 pound weight gain in 24 hours or 5 pounds in 1 week 2) shortness of breath, with or without a dry hacking cough 3) swelling in the hands, feet or stomach 4) if you have to sleep on extra pillows at night in order to breathe.   Complete by: As directed    Diet - low sodium heart healthy   Complete by: As directed    Increase activity slowly   Complete by: As directed    No wound care   Complete by: As directed      Allergies as of 09/01/2020      Reactions   Codeine    Other reaction(s): UNKNOWN   Nitrofurantoin    Other reaction(s): UNKNOWN   Penicillins Rash      Medication List    TAKE these medications   acetaminophen 500 MG tablet Commonly known as: TYLENOL Take 500 mg by mouth every 6 (six) hours as needed for fever or mild pain (discomfort).   albuterol (2.5 MG/3ML) 0.083% nebulizer solution Commonly known as: PROVENTIL Take 3 mLs (2.5 mg total) by nebulization every 6 (six) hours as needed for shortness of breath or wheezing.   amiodarone 100 MG tablet Commonly known as: PACERONE Take 100 mg by mouth daily.   apixaban 2.5 MG Tabs tablet Commonly known as: ELIQUIS Take 1 tablet (2.5 mg total) by mouth 2 (two) times daily.   DERMACLOUD EX Apply 1 application topically 3 (three) times daily. To buttocks   famotidine 10 MG tablet Commonly known as: PEPCID Take 1 tablet (10 mg total) by mouth daily.   hydrALAZINE 10 MG tablet Commonly known as: APRESOLINE Take 1 tablet (10 mg total) by mouth every 8 (eight) hours.   isosorbide mononitrate 30 MG 24 hr tablet Commonly known as: IMDUR Take 0.5 tablets (15 mg total) by mouth daily. Start taking on:  September 02, 2020 What changed: how much to take   magnesium oxide 400 MG tablet Commonly known as: MAG-OX Take 400 mg by mouth daily.   melatonin 3 MG Tabs tablet Take 6 mg by mouth at bedtime.   multivitamin with minerals tablet Take 1 tablet by mouth daily.   NON FORMULARY Take 120 mLs by mouth 2 (two) times daily. Med Pass   NON FORMULARY Take 120 mLs by mouth in the morning, at noon, in the evening, and at bedtime. Fluids   potassium chloride SA 20 MEQ tablet Commonly known as: KLOR-CON Take 1 tablet (20 mEq total) by mouth daily.   spironolactone 25 MG tablet Commonly known as: ALDACTONE Take 1 tablet (25 mg total) by mouth daily. Start taking on: September 02, 2020 What changed: how much to take   torsemide 20 MG tablet Commonly known as: DEMADEX Take 1 tablet (20 mg total) by mouth daily.       Follow-up Information    Kristin Bott, MD. Schedule an appointment as soon as possible for a visit in 1 week(s).   Specialty: Internal Medicine Contact information: [address] [place] [address]  [phone]              Allergies  Allergen Reactions  . Codeine     Other reaction(s): UNKNOWN  . Nitrofurantoin     Other reaction(s): UNKNOWN  . Penicillins Rash    Consultations:  None   Procedures/Studies: DG Chest Portable 1 View  Result Date: 08/27/2020 CLINICAL DATA:  87 year old female with severe respiratory distress. EXAM: PORTABLE CHEST 1 VIEW COMPARISON:  Portable chest 08/11/2020 and earlier. FINDINGS: Portable AP semi upright view at 0732 hours. Stable cardiomegaly and mediastinal contours. Bilateral pleural effusions have not significantly changed. Diffuse pulmonary interstitial opacity has increased in both lungs. No pneumothorax. Visualized tracheal air column is within normal limits. No air bronchograms. Osteopenia. IMPRESSION: Increased diffuse pulmonary interstitial edema. Viral/atypical respiratory infection felt  less likely. Bilateral pleural effusions with lung base atelectasis appear stable since 08/11/2020. Electronically Signed   By: Genevie Ann M.D.   On: 08/27/2020 07:50   DG Chest Portable 1 View  Result Date: 08/11/2020 CLINICAL DATA:  Shortness of breath EXAM: PORTABLE CHEST 1 VIEW COMPARISON:  07/31/2020 FINDINGS: Cardiac shadow is prominent but stable. Aortic calcifications are again identified. Bilateral pleural effusions are again identified relatively stable from the prior exam. Increasing vascular congestion and parenchymal edema is seen as well. No bony abnormality is noted. IMPRESSION: Changes consistent with increasing CHF and small bilateral pleural effusions. Electronically Signed   By: Inez Catalina M.D.   On: 08/11/2020 05:11   ECHOCARDIOGRAM COMPLETE  Result Date: 08/12/2020    ECHOCARDIOGRAM REPORT   Patient Name:   CHARRON SCHOENWALD Date of Exam: 08/12/2020 Medical Rec #:  [id number]      Height:       62.0 in Accession #:    [id number]     Weight:       157.4 lb Date of Birth:  [birth year]     BSA:          1.727 m Patient Age:    32 years       BP:           126/56 mmHg Patient Gender: F              HR:           75 bpm. Exam Location:  Inpatient Procedure: 2D Echo, Cardiac Doppler and Color Doppler Indications:    CHF, CAD  History:        Patient has prior history of Echocardiogram examinations, most                 recent 07/18/2020. at [facility] CHF; CAD.  Sonographer:    Merrie Roof RDCS Referring Phys: [id number] [place]  1. Left ventricular ejection fraction, by estimation, is 30 to 35%. The left ventricle has moderately decreased function. The left ventricle demonstrates global hypokinesis. The left ventricular internal cavity size was mildly dilated. Left ventricular diastolic parameters are consistent with Grade I diastolic dysfunction (impaired relaxation). Elevated left atrial pressure.  2. Right ventricular systolic function is normal. The right ventricular size is normal.  There is moderately elevated pulmonary artery systolic pressure. The estimated right ventricular systolic pressure is 123XX123 mmHg.  3. Left atrial size was mild to moderately dilated.  4. The mitral valve is normal in structure. Mild to moderate mitral valve regurgitation.  5. Tricuspid valve regurgitation is mild to moderate.  6. The aortic valve is tricuspid. Aortic valve regurgitation is mild. Mild aortic valve sclerosis is present, with no  evidence of aortic valve stenosis.  7. The inferior vena cava is normal in size with greater than 50% respiratory variability, suggesting right atrial pressure of 3 mmHg. Comparison(s): Prior images unable to be directly viewed, comparison made by report only. No significant change from prior study. FINDINGS  Left Ventricle: Left ventricular ejection fraction, by estimation, is 30 to 35%. The left ventricle has moderately decreased function. The left ventricle demonstrates global hypokinesis. The left ventricular internal cavity size was mildly dilated. There is no left ventricular hypertrophy. Abnormal (paradoxical) septal motion, consistent with left bundle branch block. Left ventricular diastolic parameters are consistent with Grade I diastolic dysfunction (impaired relaxation). Elevated left atrial pressure. Right Ventricle: The right ventricular size is normal. No increase in right ventricular wall thickness. Right ventricular systolic function is normal. There is moderately elevated pulmonary artery systolic pressure. The tricuspid regurgitant velocity is 3.62 m/s, and with an assumed right atrial pressure of 3 mmHg, the estimated right ventricular systolic pressure is 123XX123 mmHg. Left Atrium: Left atrial size was mild to moderately dilated. Right Atrium: Right atrial size was normal in size. Pericardium: The pericardium was not well visualized. Mitral Valve: The mitral valve is normal in structure. Mild to moderate mitral valve regurgitation. Tricuspid Valve: The tricuspid  valve is normal in structure. Tricuspid valve regurgitation is mild to moderate. Aortic Valve: The aortic valve is tricuspid. Aortic valve regurgitation is mild. Mild aortic valve sclerosis is present, with no evidence of aortic valve stenosis. Aortic valve mean gradient measures 4.0 mmHg. Aortic valve peak gradient measures 8.1 mmHg. Aortic valve area, by VTI measures 1.21 cm. Pulmonic Valve: The pulmonic valve was grossly normal. Pulmonic valve regurgitation is trivial. Aorta: The aortic root is normal in size and structure. Venous: The inferior vena cava is normal in size with greater than 50% respiratory variability, suggesting right atrial pressure of 3 mmHg. IAS/Shunts: No atrial level shunt detected by color flow Doppler.  LEFT VENTRICLE PLAX 2D LVIDd:         5.70 cm      Diastology LVIDs:         4.70 cm      LV e' medial:    3.26 cm/s LV PW:         1.10 cm      LV E/e' medial:  28.3 LV IVS:        1.10 cm      LV e' lateral:   4.68 cm/s LVOT diam:     2.00 cm      LV E/e' lateral: 19.7 LV SV:         38 LV SV Index:   22 LVOT Area:     3.14 cm                              3D Volume EF: LV Volumes (MOD)            3D EF:        29 % LV vol d, MOD A2C: 116.0 ml LV EDV:       141 ml LV vol d, MOD A4C: 95.0 ml  LV ESV:       101 ml LV vol s, MOD A2C: 69.0 ml  LV SV:        40 ml LV vol s, MOD A4C: 83.6 ml LV SV MOD A2C:     47.0 ml LV SV MOD A4C:  95.0 ml LV SV MOD BP:      30.6 ml RIGHT VENTRICLE          IVC RV Basal diam:  3.70 cm  IVC diam: 1.30 cm LEFT ATRIUM             Index       RIGHT ATRIUM           Index LA diam:        3.80 cm 2.20 cm/m  RA Area:     10.80 cm LA Vol (A2C):   69.7 ml 40.37 ml/m RA Volume:   24.50 ml  14.19 ml/m LA Vol (A4C):   45.3 ml 26.24 ml/m LA Biplane Vol: 56.9 ml 32.95 ml/m  AORTIC VALVE AV Area (Vmax):    1.66 cm AV Area (Vmean):   1.39 cm AV Area (VTI):     1.21 cm AV Vmax:           142.00 cm/s AV Vmean:          97.500 cm/s AV VTI:            0.312 m AV  Peak Grad:      8.1 mmHg AV Mean Grad:      4.0 mmHg LVOT Vmax:         75.10 cm/s LVOT Vmean:        43.200 cm/s LVOT VTI:          0.120 m LVOT/AV VTI ratio: 0.38  AORTA Ao Root diam: 2.80 cm MITRAL VALVE                 TRICUSPID VALVE MV Area (PHT): 4.06 cm      TR Peak grad:   52.4 mmHg MV Decel Time: 187 msec      TR Vmax:        362.00 cm/s MR Peak grad:    82.4 mmHg MR Vmax:         454.00 cm/s SHUNTS MR PISA:         1.01 cm    Systemic VTI:  0.12 m MR PISA Eff ROA: 7 mm       Systemic Diam: 2.00 cm MR PISA Radius:  0.40 cm MV E velocity: 92.10 cm/s MV A velocity: 118.00 cm/s MV E/A ratio:  0.78 Mihai Croitoru MD Electronically signed by Sanda Klein MD Signature Date/Time: 08/12/2020/5:26:21 PM    Final    VAS [place] LOWER EXTREMITY VENOUS (DVT)  Result Date: 08/14/2020  Lower Venous DVT Study Indications: Edema.  Limitations: Poor ultrasound/tissue interface. Comparison Study: No previous exams Performing Technologist: Rogelia Rohrer  Examination Guidelines: A complete evaluation includes B-mode imaging, spectral Doppler, color Doppler, and power Doppler as needed of all accessible portions of each vessel. Bilateral testing is considered an integral part of a complete examination. Limited examinations for reoccurring indications may be performed as noted. The reflux portion of the exam is performed with the patient in reverse Trendelenburg.  +---------+---------------+---------+-----------+----------+--------------+ RIGHT    CompressibilityPhasicitySpontaneityPropertiesThrombus Aging +---------+---------------+---------+-----------+----------+--------------+ CFV      Full           Yes      Yes                                 +---------+---------------+---------+-----------+----------+--------------+ SFJ      Full                                                        +---------+---------------+---------+-----------+----------+--------------+  FV Prox  Full           Yes      Yes                                  +---------+---------------+---------+-----------+----------+--------------+ FV Mid   Full           Yes      Yes                                 +---------+---------------+---------+-----------+----------+--------------+ FV DistalFull           Yes      Yes                                 +---------+---------------+---------+-----------+----------+--------------+ PFV      Full                                                        +---------+---------------+---------+-----------+----------+--------------+ POP      Full           Yes      Yes                                 +---------+---------------+---------+-----------+----------+--------------+ PTV      Full                                                        +---------+---------------+---------+-----------+----------+--------------+ PERO     Full                                                        +---------+---------------+---------+-----------+----------+--------------+   +---------+---------------+---------+-----------+----------+-------------------+ LEFT     CompressibilityPhasicitySpontaneityPropertiesThrombus Aging      +---------+---------------+---------+-----------+----------+-------------------+ CFV      Full           Yes      Yes                                      +---------+---------------+---------+-----------+----------+-------------------+ SFJ      Full                                                             +---------+---------------+---------+-----------+----------+-------------------+ FV Prox  Full           Yes      Yes                                      +---------+---------------+---------+-----------+----------+-------------------+  FV Mid   Full           Yes      Yes                                      +---------+---------------+---------+-----------+----------+-------------------+ FV DistalFull           Yes       Yes                                      +---------+---------------+---------+-----------+----------+-------------------+ PFV      Full                                                             +---------+---------------+---------+-----------+----------+-------------------+ POP      Full           Yes      Yes                                      +---------+---------------+---------+-----------+----------+-------------------+ PTV      Full                                         Not well visualized +---------+---------------+---------+-----------+----------+-------------------+   Left Technical Findings: Not visualized segments include Peroneal veins.   Summary: BILATERAL: - No evidence of deep vein thrombosis seen in the lower extremities, bilaterally. - No evidence of superficial venous thrombosis in the lower extremities, bilaterally. -No evidence of popliteal cyst, bilaterally.   *See table(s) above for measurements and observations. Electronically signed by Ruta Hinds MD on 08/14/2020 at 9:19:04 PM.    Final      Subjective: Patient seen and examined bedside, resting comfortably.  Eating breakfast.  Pleasantly confused.  No complaints this morning.  [facility].  Denies headache, no chest pain, no shortness of breath, no abdominal pain.  No acute events overnight per nursing staff.  Discharge Exam: Vitals:   09/01/20 0551 09/01/20 0900  BP: (!) 118/56 115/84  Pulse: 72   Resp: 17   Temp: 98.3 F (36.8 C)   SpO2: 96%    Vitals:   08/31/20 1736 08/31/20 2027 09/01/20 0551 09/01/20 0900  BP:  113/65 (!) 118/56 115/84  Pulse:  84 72   Resp:   17   Temp:  98.3 F (36.8 C) 98.3 F (36.8 C)   TempSrc:  Oral    SpO2: 94% 91% 96%   Weight:      Height:        General: Pt is alert, awake, not in acute distress, pleasantly confused, elderly in appearance Cardiovascular: RRR, S1/S2 +, no rubs, no gallops Respiratory: CTA  bilaterally, no wheezing, no rhonchi, on room air Abdominal: Soft, NT, ND, bowel sounds + Extremities: no edema, no cyanosis    The results of significant diagnostics from this hospitalization (including imaging, microbiology, ancillary and laboratory) are listed below for reference.     Microbiology:  Recent Results (from the past 240 hour(s))  Resp Panel by RT-PCR (Flu A&B, Covid) Nasopharyngeal Swab     Status: None   Collection Time: 08/27/20  8:29 AM   Specimen: Nasopharyngeal Swab; Nasopharyngeal(NP) swabs in vial transport medium  Result Value Ref Range Status   SARS Coronavirus 2 by RT PCR NEGATIVE NEGATIVE Final    Comment: (NOTE) SARS-CoV-2 target nucleic acids are NOT DETECTED.  The SARS-CoV-2 RNA is generally detectable in upper respiratory specimens during the acute phase of infection. The lowest concentration of SARS-CoV-2 viral copies this assay can detect is 138 copies/mL. A negative result does not preclude SARS-Cov-2 infection and should not be used as the sole basis for treatment or other patient management decisions. A negative result may occur with  improper specimen collection/handling, submission of specimen other than nasopharyngeal swab, presence of viral mutation(s) within the areas targeted by this assay, and inadequate number of viral copies(<138 copies/mL). A negative result must be combined with clinical observations, patient history, and epidemiological information. The expected result is Negative.  Fact Sheet for Patients:  EntrepreneurPulse.com.au  Fact Sheet for Healthcare Providers:  IncredibleEmployment.be  This test is no t yet approved or cleared by the [place] FDA and  has been authorized for detection and/or diagnosis of SARS-CoV-2 by FDA under an Emergency Use Authorization (EUA). This EUA will remain  in effect (meaning this test can be used) for the duration of the COVID-19 declaration under  Section 564(b)(1) of the Act, 21 U.S.C.section 360bbb-3(b)(1), unless the authorization is terminated  or revoked sooner.       Influenza A by PCR NEGATIVE NEGATIVE Final   Influenza B by PCR NEGATIVE NEGATIVE Final    Comment: (NOTE) The Xpert Xpress SARS-CoV-2/FLU/RSV plus assay is intended as an aid in the diagnosis of influenza from Nasopharyngeal swab specimens and should not be used as a sole basis for treatment. Nasal washings and aspirates are unacceptable for Xpert Xpress SARS-CoV-2/FLU/RSV testing.  Fact Sheet for Patients: EntrepreneurPulse.com.au  Fact Sheet for Healthcare Providers: IncredibleEmployment.be  This test is not yet approved or cleared by the [place] FDA and has been authorized for detection and/or diagnosis of SARS-CoV-2 by FDA under an Emergency Use Authorization (EUA). This EUA will remain in effect (meaning this test can be used) for the duration of the COVID-19 declaration under Section 564(b)(1) of the Act, 21 U.S.C. section 360bbb-3(b)(1), unless the authorization is terminated or revoked.  Performed at [facility], [place] [address], [place], [place] [address]   MRSA PCR Screening     Status: Abnormal   Collection Time: 08/28/20  5:25 AM   Specimen: Nasopharyngeal  Result Value Ref Range Status   MRSA by PCR POSITIVE (A) NEGATIVE Final    Comment:        The GeneXpert MRSA Assay (FDA approved for NASAL specimens only), is one component of a comprehensive MRSA colonization surveillance program. It is not intended to diagnose MRSA infection nor to guide or monitor treatment for MRSA infections. RESULT CALLED TO, READ BACK BY AND VERIFIED WITH: RN L.TYSON AT 0855 ON 08/2020 BY T.SAAD Performed at [facility], [place] [address], [place], [place] [address]   SARS CORONAVIRUS 2 (TAT 6-24 HRS) Nasopharyngeal Nasopharyngeal Swab     Status: None   Collection Time: 08/31/20 12:53 PM    Specimen: Nasopharyngeal Swab  Result Value Ref Range Status   SARS Coronavirus 2 NEGATIVE NEGATIVE Final    Comment: (NOTE) SARS-CoV-2 target nucleic  acids are NOT DETECTED.  The SARS-CoV-2 RNA is generally detectable in upper and lower respiratory specimens during the acute phase of infection. Negative results do not preclude SARS-CoV-2 infection, do not rule out co-infections with other pathogens, and should not be used as the sole basis for treatment or other patient management decisions. Negative results must be combined with clinical observations, patient history, and epidemiological information. The expected result is Negative.  Fact Sheet for Patients: SugarRoll.be  Fact Sheet for Healthcare Providers: https://www.woods-mathews.com/  This test is not yet approved or cleared by the [place] FDA and  has been authorized for detection and/or diagnosis of SARS-CoV-2 by FDA under an Emergency Use Authorization (EUA). This EUA will remain  in effect (meaning this test can be used) for the duration of the COVID-19 declaration under Se ction 564(b)(1) of the Act, 21 U.S.C. section 360bbb-3(b)(1), unless the authorization is terminated or revoked sooner.  Performed at [facility], [place] [address] [place], [place] [address]      Labs: BNP (last 3 results) Recent Labs    08/02/20 0157 08/11/20 0536 08/27/20 0748  BNP 1,239.4* 1,300.9* [id number]   Basic Metabolic Panel: Recent Labs  Lab 08/28/20 0137 08/29/20 0039 08/30/20 0246 08/31/20 0314 09/01/20 0254  NA 136 134* 136 135 136  K 5.2* 4.0 3.9 3.6 3.8  CL 104 101 102 100 101  CO2 '23 22 26 28 25  '$ GLUCOSE 88 108* 95 110* 113*  BUN 29* 29* 28* 30* 37*  CREATININE 2.25* 2.08* 1.94* 2.16* 2.33*  CALCIUM 9.5 9.3 9.5 9.5 9.3  MG 2.2  --   --  1.8 1.9   Liver Function Tests: Recent Labs  Lab 08/27/20 0748  AST 93*  ALT 60*  ALKPHOS 100  BILITOT 0.8  PROT 6.8   ALBUMIN 3.2*   No results for input(s): LIPASE, AMYLASE in the last 168 hours. No results for input(s): AMMONIA in the last 168 hours. CBC: Recent Labs  Lab 08/27/20 0748 08/27/20 0804 08/29/20 0039  WBC 15.4*  --  5.4  HGB 12.9 13.6 11.8*  HCT 42.9 40.0 35.7*  MCV 91.9  --  85.6  PLT 202  --  127*   Cardiac Enzymes: No results for input(s): CKTOTAL, CKMB, CKMBINDEX, TROPONINI in the last 168 hours. BNP: Invalid input(s): POCBNP CBG: Recent Labs  Lab 08/31/20 0748 08/31/20 1200 08/31/20 1601 08/31/20 2035 09/01/20 0810  GLUCAP 113* 117* 159* 141* 137*   D-Dimer No results for input(s): DDIMER in the last 72 hours. Hgb A1c No results for input(s): HGBA1C in the last 72 hours. Lipid Profile No results for input(s): CHOL, HDL, LDLCALC, TRIG, CHOLHDL, LDLDIRECT in the last 72 hours. Thyroid function studies No results for input(s): TSH, T4TOTAL, T3FREE, THYROIDAB in the last 72 hours.  Invalid input(s): FREET3 Anemia work up No results for input(s): VITAMINB12, FOLATE, FERRITIN, TIBC, IRON, RETICCTPCT in the last 72 hours. Urinalysis    Component Value Date/Time   COLORURINE YELLOW 07/30/2020 0906   APPEARANCEUR CLEAR 07/30/2020 0906   LABSPEC 1.006 07/30/2020 0906   PHURINE 5.0 07/30/2020 0906   GLUCOSEU NEGATIVE 07/30/2020 0906   HGBUR NEGATIVE 07/30/2020 0906   BILIRUBINUR NEGATIVE 07/30/2020 0906   KETONESUR NEGATIVE 07/30/2020 0906   PROTEINUR NEGATIVE 07/30/2020 0906   NITRITE NEGATIVE 07/30/2020 0906   LEUKOCYTESUR NEGATIVE 07/30/2020 0906   Sepsis Labs Invalid input(s): PROCALCITONIN,  WBC,  LACTICIDVEN Microbiology Recent Results (from the past 240 hour(s))  Resp Panel by RT-PCR (Flu A&B,  Covid) Nasopharyngeal Swab     Status: None   Collection Time: 08/27/20  8:29 AM   Specimen: Nasopharyngeal Swab; Nasopharyngeal(NP) swabs in vial transport medium  Result Value Ref Range Status   SARS Coronavirus 2 by RT PCR NEGATIVE NEGATIVE Final     Comment: (NOTE) SARS-CoV-2 target nucleic acids are NOT DETECTED.  The SARS-CoV-2 RNA is generally detectable in upper respiratory specimens during the acute phase of infection. The lowest concentration of SARS-CoV-2 viral copies this assay can detect is 138 copies/mL. A negative result does not preclude SARS-Cov-2 infection and should not be used as the sole basis for treatment or other patient management decisions. A negative result may occur with  improper specimen collection/handling, submission of specimen other than nasopharyngeal swab, presence of viral mutation(s) within the areas targeted by this assay, and inadequate number of viral copies(<138 copies/mL). A negative result must be combined with clinical observations, patient history, and epidemiological information. The expected result is Negative.  Fact Sheet for Patients:  EntrepreneurPulse.com.au  Fact Sheet for Healthcare Providers:  IncredibleEmployment.be  This test is no t yet approved or cleared by the [place] FDA and  has been authorized for detection and/or diagnosis of SARS-CoV-2 by FDA under an Emergency Use Authorization (EUA). This EUA will remain  in effect (meaning this test can be used) for the duration of the COVID-19 declaration under Section 564(b)(1) of the Act, 21 U.S.C.section 360bbb-3(b)(1), unless the authorization is terminated  or revoked sooner.       Influenza A by PCR NEGATIVE NEGATIVE Final   Influenza B by PCR NEGATIVE NEGATIVE Final    Comment: (NOTE) The Xpert Xpress SARS-CoV-2/FLU/RSV plus assay is intended as an aid in the diagnosis of influenza from Nasopharyngeal swab specimens and should not be used as a sole basis for treatment. Nasal washings and aspirates are unacceptable for Xpert Xpress SARS-CoV-2/FLU/RSV testing.  Fact Sheet for Patients: EntrepreneurPulse.com.au  Fact Sheet for Healthcare  Providers: IncredibleEmployment.be  This test is not yet approved or cleared by the [place] FDA and has been authorized for detection and/or diagnosis of SARS-CoV-2 by FDA under an Emergency Use Authorization (EUA). This EUA will remain in effect (meaning this test can be used) for the duration of the COVID-19 declaration under Section 564(b)(1) of the Act, 21 U.S.C. section 360bbb-3(b)(1), unless the authorization is terminated or revoked.  Performed at [facility], [place] [address], [place], [place] [address]   MRSA PCR Screening     Status: Abnormal   Collection Time: 08/28/20  5:25 AM   Specimen: Nasopharyngeal  Result Value Ref Range Status   MRSA by PCR POSITIVE (A) NEGATIVE Final    Comment:        The GeneXpert MRSA Assay (FDA approved for NASAL specimens only), is one component of a comprehensive MRSA colonization surveillance program. It is not intended to diagnose MRSA infection nor to guide or monitor treatment for MRSA infections. RESULT CALLED TO, READ BACK BY AND VERIFIED WITH: RN L.TYSON AT 0855 ON 08/2020 BY T.SAAD Performed at [facility], [place] [address], [place], [place] [address]   SARS CORONAVIRUS 2 (TAT 6-24 HRS) Nasopharyngeal Nasopharyngeal Swab     Status: None   Collection Time: 08/31/20 12:53 PM   Specimen: Nasopharyngeal Swab  Result Value Ref Range Status   SARS Coronavirus 2 NEGATIVE NEGATIVE Final    Comment: (NOTE) SARS-CoV-2 target nucleic acids are NOT DETECTED.  The SARS-CoV-2 RNA is generally detectable in upper and  lower respiratory specimens during the acute phase of infection. Negative results do not preclude SARS-CoV-2 infection, do not rule out co-infections with other pathogens, and should not be used as the sole basis for treatment or other patient management decisions. Negative results must be combined with clinical observations, patient history, and epidemiological information. The  expected result is Negative.  Fact Sheet for Patients: SugarRoll.be  Fact Sheet for Healthcare Providers: https://www.woods-mathews.com/  This test is not yet approved or cleared by the [place] FDA and  has been authorized for detection and/or diagnosis of SARS-CoV-2 by FDA under an Emergency Use Authorization (EUA). This EUA will remain  in effect (meaning this test can be used) for the duration of the COVID-19 declaration under Se ction 564(b)(1) of the Act, 21 U.S.C. section 360bbb-3(b)(1), unless the authorization is terminated or revoked sooner.  Performed at [facility], [place] [address] [place], [place] [address]      Time coordinating discharge: Over 30 minutes  SIGNED:   Donnamarie Poag [place] DO  [facility] 09/01/2020, 10:09 AM

## 2020-09-01 NOTE — NC FL2 (Addendum)
[place] MEDICAID FL2 LEVEL OF CARE SCREENING TOOL     IDENTIFICATION  Patient Name: Kristin Adkins Birthdate: Aug 21, 1920 Sex: female Admission Date (Current Location): 08/27/2020  [facility] and [place] Number:  Herbalist and Address:  [facility] [place], [place] [address] [place], [place] [address]      Provider Number: [id number]  Attending Physician Name and Address:  [place] Eric J, DO  Relative Name and Phone Number:  AUTREY, MUSHTAQ)   [phone] [facility])    Current Level of Care: SNF Recommended Level of Care: [place] Prior Approval Number:    Date Approved/Denied:   PASRR Number: [id number]  Discharge Plan: SNF    Current Diagnoses: Patient Active Problem List   Diagnosis Date Noted  . NSTEMI (non-ST elevated myocardial infarction) ([place]) 08/15/2020  . Pressure injury of skin 08/12/2020  . SIRS (systemic inflammatory response syndrome) ([place]) 08/11/2020  . Acute kidney injury superimposed on chronic kidney disease ([place]) 08/11/2020  . Acute on chronic combined systolic and diastolic CHF (congestive heart failure) ([place]) 07/30/2020  . DNR (do not resuscitate) 07/30/2020  . Acute on chronic respiratory failure with hypoxia and hypercapnia ([place]) 07/30/2020  . AF (paroxysmal atrial fibrillation) ([place]) 07/30/2020  . Chronic anticoagulation 07/30/2020  . History of COVID-19 07/30/2020  . Type 2 diabetes mellitus without complication ([place]) [id number]  . Prolonged QT interval 07/30/2020  . Acute lower UTI 07/30/2020  . Respiratory failure ([place])     Orientation RESPIRATION BLADDER Height & Weight     Self,Place  O2 Incontinent Weight: 65.8 kg Height:  '5\' 2"'$  (157.5 cm)  BEHAVIORAL SYMPTOMS/MOOD NEUROLOGICAL BOWEL NUTRITION STATUS   (n/a)  (n/a) Continent Diet (2 gram sodium)  AMBULATORY STATUS COMMUNICATION OF NEEDS Skin   Limited Assist Verbally Other (Comment) (ecchymosis)                       Personal  Care Assistance Level of Assistance  Bathing,Feeding,Dressing Bathing Assistance: Limited assistance Feeding assistance: Independent Dressing Assistance: Limited assistance     Functional Limitations Info  Sight,Hearing,Speech Sight Info: Impaired Hearing Info: Impaired Speech Info: Adequate    SPECIAL CARE FACTORS FREQUENCY  PT (By licensed PT),OT (By licensed OT)     PT Frequency: 5X/week OT Frequency: 5X/week            Contractures      Additional Factors Info  Code Status,Allergies,Psychotropic,Isolation Precautions,Insulin Sliding Scale,Suctioning Needs Code Status Info: DNR Allergies Info: Codeine, Nitrofurantoin. Penicillins   Insulin Sliding Scale Info: see discharge summary for sliding scale Isolation Precautions Info: n/a Suctioning Needs: n/a   Current Medications (09/01/2020):  This is the current hospital active medication list Current Facility-Administered Medications  Medication Dose Route Frequency Provider Last Rate Last Admin  . 0.9 %  sodium chloride infusion  250 mL Intravenous PRN Bonnell Public Tublu, MD      . acetaminophen (TYLENOL) tablet 650 mg  650 mg Oral Q4H PRN Vashti Hey, MD   650 mg at 08/27/20 2140  . albuterol (PROVENTIL) (2.5 MG/3ML) 0.083% nebulizer solution 2.5 mg  2.5 mg Nebulization Q6H PRN Bonnell Public Tublu, MD      . amiodarone (PACERONE) tablet 100 mg  100 mg Oral Daily Bonnell Public Tublu, MD   100 mg at 09/01/20 [id number]  . apixaban (ELIQUIS) tablet 2.5 mg  2.5 mg Oral BID Bonnell Public Tublu, MD   2.5 mg at 09/01/20 0929  . Chlorhexidine Gluconate Cloth 2 % PADS  6 each  6 each Topical Q0600 Domenic Polite, MD   6 each at 09/01/20 [phone]  . famotidine (PEPCID) tablet 10 mg  10 mg Oral Daily Bonnell Public Tublu, MD   10 mg at 09/01/20 0929  . insulin aspart (novoLOG) injection 0-6 Units  0-6 Units Subcutaneous TID WC Vashti Hey, MD   1 Units at 08/31/20 1734  . isosorbide  mononitrate (IMDUR) 24 hr tablet 15 mg  15 mg Oral Daily [place] Donnamarie Poag, DO   15 mg at 09/01/20 [id number]  . LORazepam (ATIVAN) injection 0.5 mg  0.5 mg Intravenous Once Bonnell Public Tublu, MD      . LORazepam (ATIVAN) injection 0.5 mg  0.5 mg Intravenous Q6H PRN [place] Eric J, DO      . melatonin tablet 3 mg  3 mg Oral QHS [place] Donnamarie Poag, DO   3 mg at 08/31/20 2036  . mupirocin ointment (BACTROBAN) 2 % 1 application  1 application Nasal BID Domenic Polite, MD   1 application at [id number] [phone]  . sodium chloride flush (NS) 0.9 % injection 3 mL  3 mL Intravenous Q12H Bonnell Public Tublu, MD   3 mL at 09/01/20 0934  . sodium chloride flush (NS) 0.9 % injection 3 mL  3 mL Intravenous PRN Bonnell Public Tublu, MD      . spironolactone (ALDACTONE) tablet 25 mg  25 mg Oral Daily [place] Donnamarie Poag, DO   25 mg at 09/01/20 [id number]  . torsemide (DEMADEX) tablet 20 mg  20 mg Oral Daily [place] Donnamarie Poag, DO   20 mg at 09/01/20 [id number]     Discharge Medications: Please see discharge summary for a list of discharge medications.  Relevant Imaging Results:  Relevant Lab Results:   Additional Information [id number]  Angelita Ingles, RN

## 2020-09-01 NOTE — Progress Notes (Signed)
Pt resting comfortably on 2L  SpO2 95 RR 24. No bipap needed at this time. RN made aware

## 2020-09-01 NOTE — Progress Notes (Signed)
Physical Therapy Treatment Patient Details Name: Kristin Adkins MRN: [id number] DOB: 1920-11-30 Today's Date: 09/01/2020    History of Present Illness 87 y/o female admitted 3/20 from [facility] for respiratory failure with flash pulmonary edema. 4th hospitalization in 2 months for the same. PMHx: HTN, CHF, Afib, CKD stage IV, dementia, solitary kidney, COVID-19 pneumonia on 1/16.    PT Comments    Pt making steady progress with mobility. Continue to recommend return to SNF.    Follow Up Recommendations  SNF;Supervision/Assistance - 24 hour     Equipment Recommendations  None recommended by PT    Recommendations for Other Services       Precautions / Restrictions Precautions Precautions: Fall;Other (comment) Precaution Comments: watch O2    Mobility  Bed Mobility               General bed mobility comments: Pt up in chair    Transfers Overall transfer level: Needs assistance Equipment used: Rolling walker (2 wheeled) Transfers: Sit to/from Stand;Stand Pivot Transfers Sit to Stand: Min assist         General transfer comment: Assist to steady  Ambulation/Gait Ambulation/Gait assistance: Min guard Gait Distance (Feet): 75 Feet Assistive device: Rolling walker (2 wheeled) Gait Pattern/deviations: Step-through pattern;Trunk flexed;Decreased stride length Gait velocity: decr Gait velocity interpretation: <1.31 ft/sec, indicative of household ambulator General Gait Details: Assist for Barrister's clerk    Modified Rankin (Stroke Patients Only)       Balance Overall balance assessment: Needs assistance Sitting-balance support: Feet supported;No upper extremity supported Sitting balance-Leahy Scale: Fair     Standing balance support: Bilateral upper extremity supported;During functional activity Standing balance-Leahy Scale: Poor Standing balance comment: walker and min guard for static standing                             Cognition Arousal/Alertness: Awake/alert Behavior During Therapy: WFL for tasks assessed/performed;Impulsive Overall Cognitive Status: History of cognitive impairments - at baseline                                 General Comments: history of dementia      Exercises      General Comments        Pertinent Vitals/Pain Pain Assessment: No/denies pain    Home Living                      Prior Function            PT Goals (current goals can now be found in the care plan section) Acute Rehab PT Goals Patient Stated Goal: go home Progress towards PT goals: Progressing toward goals    Frequency    Min 2X/week      PT Plan Current plan remains appropriate    Co-evaluation              AM-PAC PT "6 Clicks" Mobility   Outcome Measure  Help needed turning from your back to your side while in a flat bed without using bedrails?: A Little Help needed moving from lying on your back to sitting on the side of a flat bed without using bedrails?: A Little Help needed moving to and from a bed to a chair (including a wheelchair)?: A Little Help needed standing up from a  chair using your arms (e.g., wheelchair or bedside chair)?: A Little Help needed to walk in hospital room?: A Little Help needed climbing 3-5 steps with a railing? : A Lot 6 Click Score: 17    End of Session Equipment Utilized During Treatment: Gait belt Activity Tolerance: Patient tolerated treatment well Patient left: in chair;with call bell/phone within reach;with chair alarm set Nurse Communication: Mobility status PT Visit Diagnosis: Unsteadiness on feet (R26.81);Other abnormalities of gait and mobility (R26.89);History of falling (Z91.81);Muscle weakness (generalized) (M62.81);Difficulty in walking, not elsewhere classified (R26.2)     Time: [id number] PT Time Calculation (min) (ACUTE ONLY): 14 min  Charges:  $Gait Training: 8-22 mins                      [place] Pager [phone] Office [place] 09/01/2020, 4:28 PM

## 2020-09-02 DIAGNOSIS — N179 Acute kidney failure, unspecified: Secondary | ICD-10-CM | POA: Diagnosis not present

## 2020-09-02 DIAGNOSIS — R5381 Other malaise: Secondary | ICD-10-CM

## 2020-09-02 DIAGNOSIS — F0390 Unspecified dementia without behavioral disturbance: Secondary | ICD-10-CM

## 2020-09-02 DIAGNOSIS — I480 Paroxysmal atrial fibrillation: Secondary | ICD-10-CM | POA: Diagnosis not present

## 2020-09-02 DIAGNOSIS — E119 Type 2 diabetes mellitus without complications: Secondary | ICD-10-CM

## 2020-09-02 DIAGNOSIS — J9601 Acute respiratory failure with hypoxia: Secondary | ICD-10-CM | POA: Diagnosis not present

## 2020-09-02 DIAGNOSIS — I5043 Acute on chronic combined systolic (congestive) and diastolic (congestive) heart failure: Secondary | ICD-10-CM | POA: Diagnosis not present

## 2020-09-02 DIAGNOSIS — Z7901 Long term (current) use of anticoagulants: Secondary | ICD-10-CM

## 2020-09-02 LAB — GLUCOSE, CAPILLARY
Glucose-Capillary: 113 mg/dL — ABNORMAL HIGH (ref 70–99)
Glucose-Capillary: 157 mg/dL — ABNORMAL HIGH (ref 70–99)
Glucose-Capillary: 175 mg/dL — ABNORMAL HIGH (ref 70–99)

## 2020-09-02 NOTE — Progress Notes (Signed)
Pt very anxious and confused. Very difficult to redirect for more than a few minutes. Pt assisted in calling her son, [name]. No PO PRN meds available for anxiety. MD paged.

## 2020-09-02 NOTE — Progress Notes (Signed)
PROGRESS NOTE  Kristin Adkins [id number] DOB: 1920-08-17   PCP: Raelene Bott, MD  Patient is from: Home  DOA: 08/27/2020 LOS: 6  Chief complaints: Shortness of breath  Brief Narrative / Interim history: 87 year old F with PMH of dementia, combined CHF, PAF not on AC, CKD-4 and solitary kidney presenting with shortness of breath and admitted for acute hypoxemic respiratory failure due to acute on chronic CHF/recurrent pulmonary edema for the fifth time since 07/15/2020.  Treated with BiPAP and IV Lasix with improvement in his symptoms.   Subjective: Seen and examined earlier this morning.  No major events overnight of this morning.  She was sitting on bedside chair.  No complaints but not a reliable historian.  She is awake and alert but only oriented to self.  She thinks she is at rest on.  She responds no to pain or trouble breathing.  Objective: Vitals:   09/01/20 2049 09/02/20 0446 09/02/20 0500 09/02/20 1351  BP: 120/84 (!) 117/45  (!) 116/49  Pulse: 84 75  79  Resp: '18 18  16  '$ Temp: 98.4 F (36.9 C) 98.4 F (36.9 C)    TempSrc: Oral     SpO2: 95% 90%  96%  Weight:   66.1 kg   Height:        Intake/Output Summary (Last 24 hours) at 09/02/2020 1812 Last data filed at 09/02/2020 0820 Gross per 24 hour  Intake 460 ml  Output -  Net 460 ml   Filed Weights   08/27/20 1607 08/28/20 0314 09/02/20 0500  Weight: 68.9 kg 65.8 kg 66.1 kg    Examination:  GENERAL: No apparent distress.  Sitting on bedside chair. HEENT: MMM.  Vision and hearing grossly intact.  NECK: Supple.  No apparent JVD.  RESP: On RA.  No IWOB.  Fair aeration bilaterally. CVS:  RRR. Heart sounds normal.  ABD/GI/GU: BS+. Abd soft, NTND.  MSK/EXT:  Moves extremities. No apparent deformity. No edema.  SKIN: no apparent skin lesion or wound NEURO: Awake and alert.  Oriented only to self.  She thinks she is at risk home.  Follows commands. PSYCH: Calm. Normal affect.   Procedures:   None  Microbiology summarized: [id number] and influenza PCR nonreactive.  Assessment & Plan: Acute hypoxic respiratory failure due to acute on chronic combined CHF/recurrent pulmonary edema -TTE on 3/5 with LVEF of 30 to 35%, G1-DD, RVSP of 55.4. -Diuresed with IV Lasix with appropriate response. -Continue home torsemide, Imdur and Aldactone -Not a candidate for ACE inhibitor/ARB/ARNi due to CKD-4. -Monitor fluid status  Essential hypertension: BP low normal. -Continue low-dose torsemide, Imdur and Aldactone  Paroxysmal A. fib: Rate controlled. -On amiodarone and Eliquis  Uncontrolled NIDDM-2 with hyperglycemia: A1c 11.8%.  Not on meds at home. No results for input(s): HGBA1C in the last 72 hours. Recent Labs  Lab 09/01/20 1623 09/01/20 2056 09/02/20 0808 09/02/20 1123 09/02/20 1621  GLUCAP 111* 141* 113* 157* 175*  -Continue renal dose insulin -May consider starting oral med compatible with advanced renal function   CKD stage IV/solitary kidney Recent Labs    08/13/20 0116 08/14/20 0207 08/15/20 0053 08/24/20 1600 08/27/20 0748 08/28/20 0137 08/29/20 0039 08/30/20 0246 08/31/20 0314 09/01/20 0254  BUN 28* 31* 31* 24 25* 29* 29* 28* 30* 37*  CREATININE 2.24* 2.09* 2.08* 1.94* 2.39* 2.25* 2.08* 1.94* 2.16* 2.33*  -Renal function more or less stable. -Continue monitoring  Dementia without behavioral disturbance -Reorientation and delirium precautions.  Goal of care: Appropriately DNR/DNI.  Debility/physical deconditioning -Therapy recommended  SNF   Pressure injury sacrum, heels, nose, POA Continue local wound care, offloading Pressure Injury 08/27/20 Heel Right Stage 1 -  Intact skin with non-blanchable redness of a localized area usually over a bony prominence. (Active)  08/27/20 1600  Location: Heel  Location Orientation: Right  Staging: Stage 1 -  Intact skin with non-blanchable redness of a localized area usually over a bony prominence.  Wound  Description (Comments):   Present on Admission: Yes     Pressure Injury 08/27/20 Sacrum Medial Stage 1 -  Intact skin with non-blanchable redness of a localized area usually over a bony prominence. (Active)  08/27/20 1600  Location: Sacrum  Location Orientation: Medial  Staging: Stage 1 -  Intact skin with non-blanchable redness of a localized area usually over a bony prominence.  Wound Description (Comments):   Present on Admission: Yes     Pressure Injury 08/27/20 Nose Mid;Anterior Stage 1 -  Intact skin with non-blanchable redness of a localized area usually over a bony prominence. pink from previous bipap (Active)  08/27/20 1900  Location: Nose  Location Orientation: Mid;Anterior  Staging: Stage 1 -  Intact skin with non-blanchable redness of a localized area usually over a bony prominence.  Wound Description (Comments): pink from previous bipap  Present on Admission: Yes   DVT prophylaxis:  apixaban (ELIQUIS) tablet 2.5 mg Start: 08/27/20 1545 apixaban (ELIQUIS) tablet 2.5 mg  Code Status: DNR/DNI Family Communication: Patient and/or RN. Available if any question.  Level of care: Telemetry Medical Status is: Inpatient  Remains inpatient appropriate because:Unsafe d/c plan   Dispo:  Patient From: [place]  Planned Disposition: [place]  Medically stable for discharge: No         Consultants:  None   Sch Meds:  Scheduled Meds: . amiodarone  100 mg Oral Daily  . apixaban  2.5 mg Oral BID  . famotidine  10 mg Oral Daily  . insulin aspart  0-6 Units Subcutaneous TID WC  . isosorbide mononitrate  15 mg Oral Daily  . LORazepam  0.5 mg Intravenous Once  . melatonin  3 mg Oral QHS  . sodium chloride flush  3 mL Intravenous Q12H  . spironolactone  25 mg Oral Daily  . torsemide  20 mg Oral Daily   Continuous Infusions: . sodium chloride     PRN Meds:.sodium chloride, acetaminophen, albuterol, LORazepam, sodium chloride  flush  Antimicrobials: Anti-infectives (From admission, onward)   None       I have personally reviewed the following labs and images: CBC: Recent Labs  Lab 08/27/20 0748 08/27/20 0804 08/29/20 0039  WBC 15.4*  --  5.4  HGB 12.9 13.6 11.8*  HCT 42.9 40.0 35.7*  MCV 91.9  --  85.6  PLT 202  --  127*   BMP &GFR Recent Labs  Lab 08/28/20 0137 08/29/20 0039 08/30/20 0246 08/31/20 0314 09/01/20 0254  NA 136 134* 136 135 136  K 5.2* 4.0 3.9 3.6 3.8  CL 104 101 102 100 101  CO2 '23 22 26 28 25  '$ GLUCOSE 88 108* 95 110* 113*  BUN 29* 29* 28* 30* 37*  CREATININE 2.25* 2.08* 1.94* 2.16* 2.33*  CALCIUM 9.5 9.3 9.5 9.5 9.3  MG 2.2  --   --  1.8 1.9   Estimated Creatinine Clearance: 12.3 mL/min (A) (by C-G formula based on SCr of 2.33 mg/dL (H)). Liver & Pancreas: Recent Labs  Lab 08/27/20 0748  AST 93*  ALT 60*  ALKPHOS 100  BILITOT 0.8  PROT 6.8  ALBUMIN 3.2*   No results for input(s): LIPASE, AMYLASE in the last 168 hours. No results for input(s): AMMONIA in the last 168 hours. Diabetic: No results for input(s): HGBA1C in the last 72 hours. Recent Labs  Lab 09/01/20 1623 09/01/20 2056 09/02/20 0808 09/02/20 1123 09/02/20 1621  GLUCAP 111* 141* 113* 157* 175*   Cardiac Enzymes: No results for input(s): CKTOTAL, CKMB, CKMBINDEX, TROPONINI in the last 168 hours. No results for input(s): PROBNP in the last 8760 hours. Coagulation Profile: No results for input(s): INR, PROTIME in the last 168 hours. Thyroid Function Tests: No results for input(s): TSH, T4TOTAL, FREET4, T3FREE, THYROIDAB in the last 72 hours. Lipid Profile: No results for input(s): CHOL, HDL, LDLCALC, TRIG, CHOLHDL, LDLDIRECT in the last 72 hours. Anemia Panel: No results for input(s): VITAMINB12, FOLATE, FERRITIN, TIBC, IRON, RETICCTPCT in the last 72 hours. Urine analysis:    Component Value Date/Time   COLORURINE YELLOW 07/30/2020 0906   APPEARANCEUR CLEAR 07/30/2020 0906   LABSPEC  1.006 07/30/2020 0906   PHURINE 5.0 07/30/2020 0906   GLUCOSEU NEGATIVE 07/30/2020 0906   HGBUR NEGATIVE 07/30/2020 0906   BILIRUBINUR NEGATIVE 07/30/2020 0906   KETONESUR NEGATIVE 07/30/2020 0906   PROTEINUR NEGATIVE 07/30/2020 0906   NITRITE NEGATIVE 07/30/2020 0906   LEUKOCYTESUR NEGATIVE 07/30/2020 0906   Sepsis Labs: Invalid input(s): PROCALCITONIN, [place]  Microbiology: Recent Results (from the past 240 hour(s))  Resp Panel by RT-PCR (Flu A&B, Covid) Nasopharyngeal Swab     Status: None   Collection Time: 08/27/20  8:29 AM   Specimen: Nasopharyngeal Swab; Nasopharyngeal(NP) swabs in vial transport medium  Result Value Ref Range Status   SARS Coronavirus 2 by RT PCR NEGATIVE NEGATIVE Final    Comment: (NOTE) SARS-CoV-2 target nucleic acids are NOT DETECTED.  The SARS-CoV-2 RNA is generally detectable in upper respiratory specimens during the acute phase of infection. The lowest concentration of SARS-CoV-2 viral copies this assay can detect is 138 copies/mL. A negative result does not preclude SARS-Cov-2 infection and should not be used as the sole basis for treatment or other patient management decisions. A negative result may occur with  improper specimen collection/handling, submission of specimen other than nasopharyngeal swab, presence of viral mutation(s) within the areas targeted by this assay, and inadequate number of viral copies(<138 copies/mL). A negative result must be combined with clinical observations, patient history, and epidemiological information. The expected result is Negative.  Fact Sheet for Patients:  EntrepreneurPulse.com.au  Fact Sheet for Healthcare Providers:  IncredibleEmployment.be  This test is no t yet approved or cleared by the [place] FDA and  has been authorized for detection and/or diagnosis of SARS-CoV-2 by FDA under an Emergency Use Authorization (EUA). This EUA will remain  in effect  (meaning this test can be used) for the duration of the COVID-19 declaration under Section 564(b)(1) of the Act, 21 U.S.C.section 360bbb-3(b)(1), unless the authorization is terminated  or revoked sooner.       Influenza A by PCR NEGATIVE NEGATIVE Final   Influenza B by PCR NEGATIVE NEGATIVE Final    Comment: (NOTE) The Xpert Xpress SARS-CoV-2/FLU/RSV plus assay is intended as an aid in the diagnosis of influenza from Nasopharyngeal swab specimens and should not be used as a sole basis for treatment. Nasal washings and aspirates are unacceptable for Xpert Xpress SARS-CoV-2/FLU/RSV testing.  Fact Sheet for Patients: EntrepreneurPulse.com.au  Fact Sheet for Healthcare Providers: IncredibleEmployment.be  This test is not yet approved or cleared by the  [place] Architectural technologist and has been authorized for detection and/or diagnosis of SARS-CoV-2 by FDA under an Print production planner (EUA). This EUA will remain in effect (meaning this test can be used) for the duration of the COVID-19 declaration under Section 564(b)(1) of the Act, 21 U.S.C. section 360bbb-3(b)(1), unless the authorization is terminated or revoked.  Performed at [facility], [place] [address], [place], [place] [address]   MRSA PCR Screening     Status: Abnormal   Collection Time: 08/28/20  5:25 AM   Specimen: Nasopharyngeal  Result Value Ref Range Status   MRSA by PCR POSITIVE (A) NEGATIVE Final    Comment:        The GeneXpert MRSA Assay (FDA approved for NASAL specimens only), is one component of a comprehensive MRSA colonization surveillance program. It is not intended to diagnose MRSA infection nor to guide or monitor treatment for MRSA infections. RESULT CALLED TO, READ BACK BY AND VERIFIED WITH: RN L.TYSON AT 0855 ON 08/2020 BY T.SAAD Performed at [facility], [place] [address] [place], [place] [address]   SARS CORONAVIRUS 2 (TAT 6-24 HRS) Nasopharyngeal  Nasopharyngeal Swab     Status: None   Collection Time: 08/31/20 12:53 PM   Specimen: Nasopharyngeal Swab  Result Value Ref Range Status   SARS Coronavirus 2 NEGATIVE NEGATIVE Final    Comment: (NOTE) SARS-CoV-2 target nucleic acids are NOT DETECTED.  The SARS-CoV-2 RNA is generally detectable in upper and lower respiratory specimens during the acute phase of infection. Negative results do not preclude SARS-CoV-2 infection, do not rule out co-infections with other pathogens, and should not be used as the sole basis for treatment or other patient management decisions. Negative results must be combined with clinical observations, patient history, and epidemiological information. The expected result is Negative.  Fact Sheet for Patients: SugarRoll.be  Fact Sheet for Healthcare Providers: https://www.woods-mathews.com/  This test is not yet approved or cleared by the [place] FDA and  has been authorized for detection and/or diagnosis of SARS-CoV-2 by FDA under an Emergency Use Authorization (EUA). This EUA will remain  in effect (meaning this test can be used) for the duration of the COVID-19 declaration under Se ction 564(b)(1) of the Act, 21 U.S.C. section 360bbb-3(b)(1), unless the authorization is terminated or revoked sooner.  Performed at [facility], [place] [address], [place], [place] [address]     Radiology Studies: No results found.    Taye T. [place]  If 7PM-7AM, please contact night-coverage [email or web address] 09/02/2020, 6:12 PM

## 2020-09-02 NOTE — TOC Progression Note (Signed)
Transition of Care [facility]) - Progression Note    Patient Details  Name: Kristin Adkins MRN: [id number] Date of Birth: 21-May-1921  Transition of Care [facility]) CM/SW [place], [place] Phone Number: 09/02/2020, 12:34 PM  Clinical Narrative:    CSW followed up with Navi this morning who noted that pt's Josem Kaufmann is still under MD review at this time. SW will continue to follow for DC planning needs.        Expected Discharge Plan and Services           Expected Discharge Date: 09/01/20                                     Social Determinants of Health (SDOH) Interventions    Readmission Risk Interventions No flowsheet data found.

## 2020-09-02 NOTE — Plan of Care (Signed)

## 2020-09-03 DIAGNOSIS — I5043 Acute on chronic combined systolic (congestive) and diastolic (congestive) heart failure: Secondary | ICD-10-CM | POA: Diagnosis not present

## 2020-09-03 DIAGNOSIS — J9601 Acute respiratory failure with hypoxia: Secondary | ICD-10-CM | POA: Diagnosis not present

## 2020-09-03 DIAGNOSIS — N179 Acute kidney failure, unspecified: Secondary | ICD-10-CM | POA: Diagnosis not present

## 2020-09-03 DIAGNOSIS — I480 Paroxysmal atrial fibrillation: Secondary | ICD-10-CM | POA: Diagnosis not present

## 2020-09-03 LAB — GLUCOSE, CAPILLARY
Glucose-Capillary: 108 mg/dL — ABNORMAL HIGH (ref 70–99)
Glucose-Capillary: 129 mg/dL — ABNORMAL HIGH (ref 70–99)
Glucose-Capillary: 113 mg/dL — ABNORMAL HIGH (ref 70–99)
Glucose-Capillary: 152 mg/dL — ABNORMAL HIGH (ref 70–99)

## 2020-09-03 LAB — MAGNESIUM: Magnesium: 1.8 mg/dL (ref 1.7–2.4)

## 2020-09-03 LAB — HEMOGLOBIN A1C
Hgb A1c MFr Bld: 5.6 % (ref 4.8–5.6)
Mean Plasma Glucose: 114.02 mg/dL

## 2020-09-03 LAB — RENAL FUNCTION PANEL
Albumin: 2.9 g/dL — ABNORMAL LOW (ref 3.5–5.0)
Anion gap: 7 (ref 5–15)
BUN: 38 mg/dL — ABNORMAL HIGH (ref 8–23)
CO2: 26 mmol/L (ref 22–32)
Calcium: 9.7 mg/dL (ref 8.9–10.3)
Chloride: 101 mmol/L (ref 98–111)
Creatinine, Ser: 1.99 mg/dL — ABNORMAL HIGH (ref 0.44–1.00)
GFR, Estimated: 22 mL/min — ABNORMAL LOW (ref >60)
Glucose, Bld: 111 mg/dL — ABNORMAL HIGH (ref 70–99)
Phosphorus: 4.2 mg/dL (ref 2.5–4.6)
Potassium: 4.3 mmol/L (ref 3.5–5.1)
Sodium: 134 mmol/L — ABNORMAL LOW (ref 135–145)

## 2020-09-03 MED ORDER — SPIRONOLACTONE 12.5 MG HALF TABLET
12.5000 mg | ORAL_TABLET | Freq: Every day | ORAL | Status: DC
Start: 2020-09-04 — End: 2020-09-05
  Administered 2020-09-04 – 2020-09-05 (×2): 12.5 mg via ORAL
  Filled 2020-09-03 (×2): qty 1

## 2020-09-03 NOTE — Plan of Care (Signed)

## 2020-09-03 NOTE — Progress Notes (Addendum)
PROGRESS NOTE  Kristin Adkins [id number] DOB: April 26, 1921   PCP: Raelene Bott, MD  Patient is from: SNF   DOA: 08/27/2020 LOS: 7  Chief complaints: Shortness of breath  Brief Narrative / Interim history: 87 year old F with PMH of dementia, combined CHF, PAF not on AC, CKD-4 and solitary kidney presenting with shortness of breath and admitted for acute hypoxemic respiratory failure due to acute on chronic CHF/recurrent pulmonary edema for the fifth time since 07/15/2020.  Treated with BiPAP and IV Lasix with improvement in her symptoms.  Transition to p.o. diuretics and remained stable.  Therapy recommended SNF.  Waiting on insurance authorization.  Subjective: Seen and examined earlier this morning.  Reportedly confused and anxious yesterday evening about 8 PM but remained stable for the rest of the night.  No complaint this morning.  Sitting on bedside chair comfortably.  She denies pain, shortness of breath, GI or UTI symptoms but not a reliable historian.  She is awake and alert but only oriented to self.  Objective: Vitals:   09/02/20 1351 09/02/20 2216 09/03/20 0509 09/03/20 0916  BP: (!) 116/49 (!) 113/98 (!) 118/53 (!) 119/53  Pulse: 79 79 84 80  Resp: '16 16 18 16  '$ Temp:  98.3 F (36.8 C) 98.6 F (37 C) 97.9 F (36.6 C)  TempSrc:  Oral Oral Oral  SpO2: 96% 95% 90% 96%  Weight:      Height:       No intake or output data in the 24 hours ending 09/03/20 1202 Filed Weights   08/27/20 1607 08/28/20 0314 09/02/20 0500  Weight: 68.9 kg 65.8 kg 66.1 kg    Examination:  GENERAL: No apparent distress.  Nontoxic. HEENT: MMM.  Vision and hearing grossly intact.  NECK: Supple.  No apparent JVD.  RESP: On RA.  No IWOB.  Fair aeration bilaterally. CVS:  RRR. Heart sounds normal.  ABD/GI/GU: BS+. Abd soft, NTND.  MSK/EXT:  Moves extremities. No apparent deformity. No edema.  SKIN: no apparent skin lesion or wound NEURO: Awake and alert.  Oriented only to self.   She  thinks she is at rest home.  Follows commands.  PSYCH: Calm. Normal affect.  Procedures:  None  Microbiology summarized: [id number] and influenza PCR nonreactive.  Assessment & Plan: Acute hypoxic respiratory failure due to acute on chronic combined CHF/recurrent pulmonary edema.  TTE on 3/5 with LVEF of 30 to 35%, G1-DD, RVSP of 55.4. Diuresed with IV Lasix and improved.  Transition to p.o. diuretics.  I&O incomplete.  Remains stable on room air.  Soft blood pressures. -Continue low-dose torsemide and Imdur-may have to discontinue Imdur if BP remains soft -Decrease Aldactone to 12.5 mg daily. -Not a candidate for ACE inhibitor/ARB/ARNi due to CKD-4. -Monitor fluid status  Essential hypertension: BP low normal. -Diuretics and Imdur as above  Paroxysmal A. fib: Rate controlled. -On amiodarone and Eliquis  Controlled NIDDM-2 with hyperglycemia: A1c 5.6%.  Does not seems to be on medication at home. Recent Labs  Lab 09/02/20 0808 09/02/20 1123 09/02/20 1621 09/03/20 0734 09/03/20 1148  GLUCAP 113* 157* 175* 108* 129*  -Continue renal dose SSI.   AKI on CKD-4 and patient with solitary kidney/azotemia: Improved. Recent Labs    08/14/20 0207 08/15/20 0053 08/24/20 1600 08/27/20 0748 08/28/20 0137 08/29/20 0039 08/30/20 0246 08/31/20 0314 09/01/20 0254 09/03/20 0034  BUN 31* 31* 24 25* 29* 29* 28* 30* 37* 38*  CREATININE 2.09* 2.08* 1.94* 2.39* 2.25* 2.08* 1.94* 2.16* 2.33* 1.99*  -Renal function more or  less stable. -Continue monitoring  Dementia without behavioral disturbance: Awake and alert but only oriented to self. -Reorientation and delirium precautions. -Avoid sedating medications  Goal of care: Appropriately DNR/DNI.  Debility/physical deconditioning: per son, some declines over the last three weeks but was able to walk about 250 lbs with walk and do her ADL's  -Therapy recommended SNF   Pressure injury sacrum, heels, nose, POA Continue local wound  care, offloading Pressure Injury 08/27/20 Heel Right Stage 1 -  Intact skin with non-blanchable redness of a localized area usually over a bony prominence. (Active)  08/27/20 1600  Location: Heel  Location Orientation: Right  Staging: Stage 1 -  Intact skin with non-blanchable redness of a localized area usually over a bony prominence.  Wound Description (Comments):   Present on Admission: Yes     Pressure Injury 08/27/20 Sacrum Medial Stage 1 -  Intact skin with non-blanchable redness of a localized area usually over a bony prominence. (Active)  08/27/20 1600  Location: Sacrum  Location Orientation: Medial  Staging: Stage 1 -  Intact skin with non-blanchable redness of a localized area usually over a bony prominence.  Wound Description (Comments):   Present on Admission: Yes     Pressure Injury 08/27/20 Nose Mid;Anterior Stage 1 -  Intact skin with non-blanchable redness of a localized area usually over a bony prominence. pink from previous bipap (Active)  08/27/20 1900  Location: Nose  Location Orientation: Mid;Anterior  Staging: Stage 1 -  Intact skin with non-blanchable redness of a localized area usually over a bony prominence.  Wound Description (Comments): pink from previous bipap  Present on Admission: Yes   DVT prophylaxis:  apixaban (ELIQUIS) tablet 2.5 mg Start: 08/27/20 1545 apixaban (ELIQUIS) tablet 2.5 mg    Code Status: DNR/DNI Family Communication: Updated patient's son over the phone. Level of care: Telemetry Medical Status is: Inpatient  Remains inpatient appropriate because:Unsafe d/c plan   Dispo:  Patient From: [facility]  Planned Disposition: [facility]  Medically stable for discharge: No         Consultants:  None   Sch Meds:  Scheduled Meds: . amiodarone  100 mg Oral Daily  . apixaban  2.5 mg Oral BID  . famotidine  10 mg Oral Daily  . insulin aspart  0-6 Units Subcutaneous TID WC  . isosorbide mononitrate   15 mg Oral Daily  . LORazepam  0.5 mg Intravenous Once  . melatonin  3 mg Oral QHS  . sodium chloride flush  3 mL Intravenous Q12H  . spironolactone  25 mg Oral Daily  . torsemide  20 mg Oral Daily   Continuous Infusions: . sodium chloride     PRN Meds:.sodium chloride, acetaminophen, albuterol, LORazepam, sodium chloride flush  Antimicrobials: Anti-infectives (From admission, onward)   None       I have personally reviewed the following labs and images: CBC: Recent Labs  Lab 08/29/20 0039  WBC 5.4  HGB 11.8*  HCT 35.7*  MCV 85.6  PLT 127*   BMP &GFR Recent Labs  Lab 08/28/20 0137 08/29/20 0039 08/30/20 0246 08/31/20 0314 09/01/20 0254 09/03/20 0034  NA 136 134* 136 135 136 134*  K 5.2* 4.0 3.9 3.6 3.8 4.3  CL 104 101 102 100 101 101  CO2 '23 22 26 28 25 26  '$ GLUCOSE 88 108* 95 110* 113* 111*  BUN 29* 29* 28* 30* 37* 38*  CREATININE 2.25* 2.08* 1.94* 2.16* 2.33* 1.99*  CALCIUM 9.5 9.3 9.5 9.5  9.3 9.7  MG 2.2  --   --  1.8 1.9 1.8  PHOS  --   --   --   --   --  4.2   Estimated Creatinine Clearance: 14.4 mL/min (A) (by C-G formula based on SCr of 1.99 mg/dL (H)). Liver & Pancreas: Recent Labs  Lab 09/03/20 0034  ALBUMIN 2.9*   No results for input(s): LIPASE, AMYLASE in the last 168 hours. No results for input(s): AMMONIA in the last 168 hours. Diabetic: Recent Labs    09/03/20 0034  HGBA1C 5.6   Recent Labs  Lab 09/02/20 0808 09/02/20 1123 09/02/20 1621 09/03/20 0734 09/03/20 1148  GLUCAP 113* 157* 175* 108* 129*   Cardiac Enzymes: No results for input(s): CKTOTAL, CKMB, CKMBINDEX, TROPONINI in the last 168 hours. No results for input(s): PROBNP in the last 8760 hours. Coagulation Profile: No results for input(s): INR, PROTIME in the last 168 hours. Thyroid Function Tests: No results for input(s): TSH, T4TOTAL, FREET4, T3FREE, THYROIDAB in the last 72 hours. Lipid Profile: No results for input(s): CHOL, HDL, LDLCALC, TRIG, CHOLHDL,  LDLDIRECT in the last 72 hours. Anemia Panel: No results for input(s): VITAMINB12, FOLATE, FERRITIN, TIBC, IRON, RETICCTPCT in the last 72 hours. Urine analysis:    Component Value Date/Time   COLORURINE YELLOW 07/30/2020 0906   APPEARANCEUR CLEAR 07/30/2020 0906   LABSPEC 1.006 07/30/2020 0906   PHURINE 5.0 07/30/2020 0906   GLUCOSEU NEGATIVE 07/30/2020 0906   HGBUR NEGATIVE 07/30/2020 0906   BILIRUBINUR NEGATIVE 07/30/2020 0906   KETONESUR NEGATIVE 07/30/2020 0906   PROTEINUR NEGATIVE 07/30/2020 0906   NITRITE NEGATIVE 07/30/2020 0906   LEUKOCYTESUR NEGATIVE 07/30/2020 0906   Sepsis Labs: Invalid input(s): PROCALCITONIN, [place]  Microbiology: Recent Results (from the past 240 hour(s))  Resp Panel by RT-PCR (Flu A&B, Covid) Nasopharyngeal Swab     Status: None   Collection Time: 08/27/20  8:29 AM   Specimen: Nasopharyngeal Swab; Nasopharyngeal(NP) swabs in vial transport medium  Result Value Ref Range Status   SARS Coronavirus 2 by RT PCR NEGATIVE NEGATIVE Final    Comment: (NOTE) SARS-CoV-2 target nucleic acids are NOT DETECTED.  The SARS-CoV-2 RNA is generally detectable in upper respiratory specimens during the acute phase of infection. The lowest concentration of SARS-CoV-2 viral copies this assay can detect is 138 copies/mL. A negative result does not preclude SARS-Cov-2 infection and should not be used as the sole basis for treatment or other patient management decisions. A negative result may occur with  improper specimen collection/handling, submission of specimen other than nasopharyngeal swab, presence of viral mutation(s) within the areas targeted by this assay, and inadequate number of viral copies(<138 copies/mL). A negative result must be combined with clinical observations, patient history, and epidemiological information. The expected result is Negative.  Fact Sheet for Patients:  EntrepreneurPulse.com.au  Fact Sheet for Healthcare  Providers:  IncredibleEmployment.be  This test is no t yet approved or cleared by the [place] FDA and  has been authorized for detection and/or diagnosis of SARS-CoV-2 by FDA under an Emergency Use Authorization (EUA). This EUA will remain  in effect (meaning this test can be used) for the duration of the COVID-19 declaration under Section 564(b)(1) of the Act, 21 U.S.C.section 360bbb-3(b)(1), unless the authorization is terminated  or revoked sooner.       Influenza A by PCR NEGATIVE NEGATIVE Final   Influenza B by PCR NEGATIVE NEGATIVE Final    Comment: (NOTE) The Xpert Xpress SARS-CoV-2/FLU/RSV plus assay is intended as an  aid in the diagnosis of influenza from Nasopharyngeal swab specimens and should not be used as a sole basis for treatment. Nasal washings and aspirates are unacceptable for Xpert Xpress SARS-CoV-2/FLU/RSV testing.  Fact Sheet for Patients: EntrepreneurPulse.com.au  Fact Sheet for Healthcare Providers: IncredibleEmployment.be  This test is not yet approved or cleared by the [place] FDA and has been authorized for detection and/or diagnosis of SARS-CoV-2 by FDA under an Emergency Use Authorization (EUA). This EUA will remain in effect (meaning this test can be used) for the duration of the COVID-19 declaration under Section 564(b)(1) of the Act, 21 U.S.C. section 360bbb-3(b)(1), unless the authorization is terminated or revoked.  Performed at [facility], [place] [address], [place], [place] [address]   MRSA PCR Screening     Status: Abnormal   Collection Time: 08/28/20  5:25 AM   Specimen: Nasopharyngeal  Result Value Ref Range Status   MRSA by PCR POSITIVE (A) NEGATIVE Final    Comment:        The GeneXpert MRSA Assay (FDA approved for NASAL specimens only), is one component of a comprehensive MRSA colonization surveillance program. It is not intended to diagnose MRSA infection  nor to guide or monitor treatment for MRSA infections. RESULT CALLED TO, READ BACK BY AND VERIFIED WITH: RN L.TYSON AT 0855 ON 08/2020 BY T.SAAD Performed at [facility], [place] [address] [place], [place] [address]   SARS CORONAVIRUS 2 (TAT 6-24 HRS) Nasopharyngeal Nasopharyngeal Swab     Status: None   Collection Time: 08/31/20 12:53 PM   Specimen: Nasopharyngeal Swab  Result Value Ref Range Status   SARS Coronavirus 2 NEGATIVE NEGATIVE Final    Comment: (NOTE) SARS-CoV-2 target nucleic acids are NOT DETECTED.  The SARS-CoV-2 RNA is generally detectable in upper and lower respiratory specimens during the acute phase of infection. Negative results do not preclude SARS-CoV-2 infection, do not rule out co-infections with other pathogens, and should not be used as the sole basis for treatment or other patient management decisions. Negative results must be combined with clinical observations, patient history, and epidemiological information. The expected result is Negative.  Fact Sheet for Patients: SugarRoll.be  Fact Sheet for Healthcare Providers: https://www.woods-mathews.com/  This test is not yet approved or cleared by the [place] FDA and  has been authorized for detection and/or diagnosis of SARS-CoV-2 by FDA under an Emergency Use Authorization (EUA). This EUA will remain  in effect (meaning this test can be used) for the duration of the COVID-19 declaration under Se ction 564(b)(1) of the Act, 21 U.S.C. section 360bbb-3(b)(1), unless the authorization is terminated or revoked sooner.  Performed at [facility], [place] [address], [place], [place] [address]     Radiology Studies: No results found.    Neysha Criado T. [place]  If 7PM-7AM, please contact night-coverage [email or web address] 09/03/2020, 12:02 PM

## 2020-09-04 ENCOUNTER — Other Ambulatory Visit: Payer: Self-pay

## 2020-09-04 ENCOUNTER — Inpatient Hospital Stay (HOSPITAL_COMMUNITY): Payer: Medicare Other

## 2020-09-04 DIAGNOSIS — I480 Paroxysmal atrial fibrillation: Secondary | ICD-10-CM | POA: Diagnosis not present

## 2020-09-04 DIAGNOSIS — N179 Acute kidney failure, unspecified: Secondary | ICD-10-CM | POA: Diagnosis not present

## 2020-09-04 DIAGNOSIS — J9601 Acute respiratory failure with hypoxia: Secondary | ICD-10-CM | POA: Diagnosis not present

## 2020-09-04 DIAGNOSIS — I5043 Acute on chronic combined systolic (congestive) and diastolic (congestive) heart failure: Secondary | ICD-10-CM | POA: Diagnosis not present

## 2020-09-04 LAB — POTASSIUM: Potassium: 5 mmol/L (ref 3.5–5.1)

## 2020-09-04 LAB — GLUCOSE, CAPILLARY
Glucose-Capillary: 111 mg/dL — ABNORMAL HIGH (ref 70–99)
Glucose-Capillary: 133 mg/dL — ABNORMAL HIGH (ref 70–99)
Glucose-Capillary: 110 mg/dL — ABNORMAL HIGH (ref 70–99)
Glucose-Capillary: 107 mg/dL — ABNORMAL HIGH (ref 70–99)

## 2020-09-04 LAB — CBC
HCT: 38.5 % (ref 36.0–46.0)
Hemoglobin: 12.6 g/dL (ref 12.0–15.0)
MCH: 27.9 pg (ref 26.0–34.0)
MCHC: 32.7 g/dL (ref 30.0–36.0)
MCV: 85.2 fL (ref 80.0–100.0)
Platelets: 133 10*3/uL — ABNORMAL LOW (ref 150–400)
RBC: 4.52 MIL/uL (ref 3.87–5.11)
RDW: 13.7 % (ref 11.5–15.5)
WBC: 5.9 10*3/uL (ref 4.0–10.5)
nRBC: 0 % (ref 0.0–0.2)

## 2020-09-04 LAB — MAGNESIUM: Magnesium: 1.9 mg/dL (ref 1.7–2.4)

## 2020-09-04 LAB — CREATININE, SERUM
Creatinine, Ser: 2.22 mg/dL — ABNORMAL HIGH (ref 0.44–1.00)
GFR, Estimated: 20 mL/min — ABNORMAL LOW (ref >60)

## 2020-09-04 LAB — BRAIN NATRIURETIC PEPTIDE: B Natriuretic Peptide: 1374.8 pg/mL — ABNORMAL HIGH (ref 0.0–100.0)

## 2020-09-04 MED ORDER — TORSEMIDE 20 MG PO TABS
20.0000 mg | ORAL_TABLET | Freq: Two times a day (BID) | ORAL | Status: DC
  Administered 2020-09-04 – 2020-09-05 (×2): 20 mg via ORAL
  Filled 2020-09-04 (×4): qty 1

## 2020-09-04 MED ORDER — QUETIAPINE FUMARATE 25 MG PO TABS
12.5000 mg | ORAL_TABLET | Freq: Once | ORAL | Status: AC
  Administered 2020-09-04: 12.5 mg via ORAL
  Filled 2020-09-04: qty 1

## 2020-09-04 NOTE — Progress Notes (Signed)
PROGRESS NOTE  Kristin Adkins [id number] DOB: [birth year]   PCP: Raelene Bott, MD  Patient is from: LTC at [facility]: 08/27/2020 LOS: 8  Chief complaints: Shortness of breath  Brief Narrative / Interim history: 87 year old F with PMH of dementia, combined CHF, PAF not on AC, CKD-4 and solitary kidney presenting with shortness of breath and admitted for acute hypoxemic respiratory failure due to acute on chronic CHF/recurrent pulmonary edema for the fifth time since 07/15/2020.  Treated with BiPAP and IV Lasix with improvement in her symptoms.  Transition to p.o. diuretics and remained stable.  Therapy recommended SNF but insurance recommended going back to LTC saying she is almost back to her recent baseline when she was moved from SNF to LTC at [facility] prior to this admission based on therapy reports. Patient will be discharged to [facility] on 3/29.  Subjective: Seen and examined earlier this morning.  Patient desaturated to 85% on RA earlier this morning.  Per rapid response RN "Pt lying in bed, AO. Disoriented to month. Able to speak in full sentences, although breathing appears heavy. No accessory muscle use. Lung sounds clear, delayed faint expiratory wheeze. Abdomen is soft. Skin is warm, dry, pink. Pt denies pain. Endorses breathing is heavy".  Chest x-ray ordered and reported as stable vascular congestion, pleural effusion and less interstitial edema.  On my evaluation, patient sitting on bed comfortably.  She denies chest pain, dyspnea, GI or UTI symptoms at rest but reports feeling short of breath when she moves.  She is awake and alert but only oriented to self.  Objective: Vitals:   09/04/20 0353 09/04/20 0812 09/04/20 0840 09/04/20 1200  BP: (!) 117/54 134/84 (!) 132/48   Pulse: 92 (!) 108 95 93  Resp: 18 20 (!) 22 20  Temp: 97.6 F (36.4 C) 97.8 F (36.6 C)    TempSrc:  Oral  Oral  SpO2: 91% 97% 93% 99%  Weight:      Height:        Intake/Output Summary (Last 24  hours) at 09/04/2020 1324 Last data filed at 09/03/2020 1835 Gross per 24 hour  Intake 175 ml  Output --  Net 175 ml   Filed Weights   08/27/20 1607 08/28/20 0314 09/02/20 0500  Weight: 68.9 kg 65.8 kg 66.1 kg    Examination:  GENERAL: No apparent distress.  Nontoxic. HEENT: MMM.  Vision and hearing grossly intact.  NECK: Supple.  No apparent JVD.  RESP: On room air.  No IWOB.  Fair aeration bilaterally. CVS:  RRR. Heart sounds normal.  ABD/GI/GU: BS+. Abd soft, NTND.  MSK/EXT:  Moves extremities. No apparent deformity.  Trace edema bilaterally. SKIN: no apparent skin lesion or wound NEURO: Awake and alert.  Oriented to self.  Follows commands.  No apparent focal neuro deficit. PSYCH: Calm. Normal affect.  Procedures:  None  Microbiology summarized: [id number] and influenza PCR nonreactive.  Assessment & Plan: Acute hypoxic respiratory failure due to acute on chronic combined CHF/recurrent pulmonary edema.  TTE on 3/5 with LVEF of 30 to 35%, G1-DD, RVSP of 55.4. Diuresed with IV Lasix and transitioned to Po torsemide and Aldactone.  I&O incomplete.  Reportedly had brief desaturation to 85% earlier this morning.  CXR with stable vascular congestion but appears a little worse on my review. -Check BNP, Cr, Mg and K -Increase torsemide to 20 mg twice daily.  Continue Aldactone 12.5 mg daily -GDMT-none due to soft BP and .  Discontinued Imdur -Monitor fluid status and renal  function -Encourage ongoing discussion with palliative care  Essential hypertension: Soft early in the morning but improved. -Diuretics as above  Paroxysmal A. fib: Rate controlled. -On amiodarone and Eliquis  Controlled NIDDM-2 with hyperglycemia: A1c 5.6%.  Does not seems to be on medication at home. Recent Labs  Lab 09/03/20 1148 09/03/20 1554 09/03/20 2015 09/04/20 0718 09/04/20 1153  GLUCAP 129* 113* 152* 111* 133*  -Continue renal dose SSI.   AKI on CKD-4 and patient with solitary  kidney/azotemia: Improved. Recent Labs    08/14/20 0207 08/15/20 0053 08/24/20 1600 08/27/20 0748 08/28/20 0137 08/29/20 0039 08/30/20 0246 08/31/20 0314 09/01/20 0254 09/03/20 0034  BUN 31* 31* 24 25* 29* 29* 28* 30* 37* 38*  CREATININE 2.09* 2.08* 1.94* 2.39* 2.25* 2.08* 1.94* 2.16* 2.33* 1.99*  -Renal function more or less stable. -Continue monitoring  Dementia without behavioral disturbance: Awake and alert but only oriented to self. -Reorientation and delirium precautions. -Avoid sedating medications  Goal of care: appropriately DNR/DNI. Patient with advanced CHF/NYHA-stage-4 and recurrent admission for pulmonary edema.  Also with CKD-4 and hypotension which precludes diuresis.  Poor long-term prognosis.  Candidate for hospice.  Per patient's son, she is seen by palliative care at [facility].   -Encouraged ongoing discussion with palliative care when she returns to [facility]  Debility/physical deconditioning: per son, some declines over the last three weeks but was able to walk about 250 lbs with walk and do her ADL's  -Therapy recommended SNF but insurance recommends LTC     Pressure injury sacrum, heels, nose, POA Continue local wound care, offloading Pressure Injury 08/27/20 Heel Right Stage 1 -  Intact skin with non-blanchable redness of a localized area usually over a bony prominence. (Active)  08/27/20 1600  Location: Heel  Location Orientation: Right  Staging: Stage 1 -  Intact skin with non-blanchable redness of a localized area usually over a bony prominence.  Wound Description (Comments):   Present on Admission: Yes     Pressure Injury 08/27/20 Sacrum Medial Stage 1 -  Intact skin with non-blanchable redness of a localized area usually over a bony prominence. (Active)  08/27/20 1600  Location: Sacrum  Location Orientation: Medial  Staging: Stage 1 -  Intact skin with non-blanchable redness of a localized area usually over a bony prominence.  Wound Description  (Comments):   Present on Admission: Yes     Pressure Injury 08/27/20 Nose Mid;Anterior Stage 1 -  Intact skin with non-blanchable redness of a localized area usually over a bony prominence. pink from previous bipap (Active)  08/27/20 1900  Location: Nose  Location Orientation: Mid;Anterior  Staging: Stage 1 -  Intact skin with non-blanchable redness of a localized area usually over a bony prominence.  Wound Description (Comments): pink from previous bipap  Present on Admission: Yes   DVT prophylaxis:  apixaban (ELIQUIS) tablet 2.5 mg Start: 08/27/20 1545 apixaban (ELIQUIS) tablet 2.5 mg    Code Status: DNR/DNI Family Communication: Updated patient's son at bedside Level of care: Med-Surg Status is: Inpatient  Remains inpatient appropriate because:Unsafe d/c plan   Dispo:  Patient From: Long-term care facility at [facility]  planned Disposition: Long-term care facility at [facility]  medically stable for discharge: No         Consultants:  None   Sch Meds:  Scheduled Meds: . amiodarone  100 mg Oral Daily  . apixaban  2.5 mg Oral BID  . famotidine  10 mg Oral Daily  . insulin aspart  0-6 Units Subcutaneous TID WC  .  melatonin  3 mg Oral QHS  . sodium chloride flush  3 mL Intravenous Q12H  . spironolactone  12.5 mg Oral Daily  . torsemide  20 mg Oral Daily   Continuous Infusions: . sodium chloride     PRN Meds:.sodium chloride, acetaminophen, albuterol, LORazepam, sodium chloride flush  Antimicrobials: Anti-infectives (From admission, onward)   None       I have personally reviewed the following labs and images: CBC: Recent Labs  Lab 08/29/20 0039 09/04/20 1156  WBC 5.4 5.9  HGB 11.8* 12.6  HCT 35.7* 38.5  MCV 85.6 85.2  PLT 127* 133*   BMP &GFR Recent Labs  Lab 08/29/20 0039 08/30/20 0246 08/31/20 0314 09/01/20 0254 09/03/20 0034  NA 134* 136 135 136 134*  K 4.0 3.9 3.6 3.8 4.3  CL 101 102 100 101 101  CO2 '22 26 28 25 26  '$ GLUCOSE 108* 95 110*  113* 111*  BUN 29* 28* 30* 37* 38*  CREATININE 2.08* 1.94* 2.16* 2.33* 1.99*  CALCIUM 9.3 9.5 9.5 9.3 9.7  MG  --   --  1.8 1.9 1.8  PHOS  --   --   --   --  4.2   Estimated Creatinine Clearance: 14.4 mL/min (A) (by C-G formula based on SCr of 1.99 mg/dL (H)). Liver & Pancreas: Recent Labs  Lab 09/03/20 0034  ALBUMIN 2.9*   No results for input(s): LIPASE, AMYLASE in the last 168 hours. No results for input(s): AMMONIA in the last 168 hours. Diabetic: Recent Labs    09/03/20 0034  HGBA1C 5.6   Recent Labs  Lab 09/03/20 1148 09/03/20 1554 09/03/20 2015 09/04/20 0718 09/04/20 1153  GLUCAP 129* 113* 152* 111* 133*   Cardiac Enzymes: No results for input(s): CKTOTAL, CKMB, CKMBINDEX, TROPONINI in the last 168 hours. No results for input(s): PROBNP in the last 8760 hours. Coagulation Profile: No results for input(s): INR, PROTIME in the last 168 hours. Thyroid Function Tests: No results for input(s): TSH, T4TOTAL, FREET4, T3FREE, THYROIDAB in the last 72 hours. Lipid Profile: No results for input(s): CHOL, HDL, LDLCALC, TRIG, CHOLHDL, LDLDIRECT in the last 72 hours. Anemia Panel: No results for input(s): VITAMINB12, FOLATE, FERRITIN, TIBC, IRON, RETICCTPCT in the last 72 hours. Urine analysis:    Component Value Date/Time   COLORURINE YELLOW 07/30/2020 0906   APPEARANCEUR CLEAR 07/30/2020 0906   LABSPEC 1.006 07/30/2020 0906   PHURINE 5.0 07/30/2020 0906   GLUCOSEU NEGATIVE 07/30/2020 0906   HGBUR NEGATIVE 07/30/2020 0906   BILIRUBINUR NEGATIVE 07/30/2020 0906   KETONESUR NEGATIVE 07/30/2020 0906   PROTEINUR NEGATIVE 07/30/2020 0906   NITRITE NEGATIVE 07/30/2020 0906   LEUKOCYTESUR NEGATIVE 07/30/2020 0906   Sepsis Labs: Invalid input(s): PROCALCITONIN, [place]  Microbiology: Recent Results (from the past 240 hour(s))  Resp Panel by RT-PCR (Flu A&B, Covid) Nasopharyngeal Swab     Status: None   Collection Time: 08/27/20  8:29 AM   Specimen:  Nasopharyngeal Swab; Nasopharyngeal(NP) swabs in vial transport medium  Result Value Ref Range Status   SARS Coronavirus 2 by RT PCR NEGATIVE NEGATIVE Final    Comment: (NOTE) SARS-CoV-2 target nucleic acids are NOT DETECTED.  The SARS-CoV-2 RNA is generally detectable in upper respiratory specimens during the acute phase of infection. The lowest concentration of SARS-CoV-2 viral copies this assay can detect is 138 copies/mL. A negative result does not preclude SARS-Cov-2 infection and should not be used as the sole basis for treatment or other patient management decisions. A negative result may occur  with  improper specimen collection/handling, submission of specimen other than nasopharyngeal swab, presence of viral mutation(s) within the areas targeted by this assay, and inadequate number of viral copies(<138 copies/mL). A negative result must be combined with clinical observations, patient history, and epidemiological information. The expected result is Negative.  Fact Sheet for Patients:  EntrepreneurPulse.com.au  Fact Sheet for Healthcare Providers:  IncredibleEmployment.be  This test is no t yet approved or cleared by the [place] FDA and  has been authorized for detection and/or diagnosis of SARS-CoV-2 by FDA under an Emergency Use Authorization (EUA). This EUA will remain  in effect (meaning this test can be used) for the duration of the COVID-19 declaration under Section 564(b)(1) of the Act, 21 U.S.C.section 360bbb-3(b)(1), unless the authorization is terminated  or revoked sooner.       Influenza A by PCR NEGATIVE NEGATIVE Final   Influenza B by PCR NEGATIVE NEGATIVE Final    Comment: (NOTE) The Xpert Xpress SARS-CoV-2/FLU/RSV plus assay is intended as an aid in the diagnosis of influenza from Nasopharyngeal swab specimens and should not be used as a sole basis for treatment. Nasal washings and aspirates are unacceptable for  Xpert Xpress SARS-CoV-2/FLU/RSV testing.  Fact Sheet for Patients: EntrepreneurPulse.com.au  Fact Sheet for Healthcare Providers: IncredibleEmployment.be  This test is not yet approved or cleared by the [place] FDA and has been authorized for detection and/or diagnosis of SARS-CoV-2 by FDA under an Emergency Use Authorization (EUA). This EUA will remain in effect (meaning this test can be used) for the duration of the COVID-19 declaration under Section 564(b)(1) of the Act, 21 U.S.C. section 360bbb-3(b)(1), unless the authorization is terminated or revoked.  Performed at [facility], [place] [address], [place],  [address]   MRSA PCR Screening     Status: Abnormal   Collection Time: 08/28/20  5:25 AM   Specimen: Nasopharyngeal  Result Value Ref Range Status   MRSA by PCR POSITIVE (A) NEGATIVE Final    Comment:        The GeneXpert MRSA Assay (FDA approved for NASAL specimens only), is one component of a comprehensive MRSA colonization surveillance program. It is not intended to diagnose MRSA infection nor to guide or monitor treatment for MRSA infections. RESULT CALLED TO, READ BACK BY AND VERIFIED WITH: RN L.TYSON AT 0855 ON 08/2020 BY T.SAAD Performed at [facility], [place] [address] [place], [place] [address]   SARS CORONAVIRUS 2 (TAT 6-24 HRS) Nasopharyngeal Nasopharyngeal Swab     Status: None   Collection Time: 08/31/20 12:53 PM   Specimen: Nasopharyngeal Swab  Result Value Ref Range Status   SARS Coronavirus 2 NEGATIVE NEGATIVE Final    Comment: (NOTE) SARS-CoV-2 target nucleic acids are NOT DETECTED.  The SARS-CoV-2 RNA is generally detectable in upper and lower respiratory specimens during the acute phase of infection. Negative results do not preclude SARS-CoV-2 infection, do not rule out co-infections with other pathogens, and should not be used as the sole basis for treatment or other patient  management decisions. Negative results must be combined with clinical observations, patient history, and epidemiological information. The expected result is Negative.  Fact Sheet for Patients: SugarRoll.be  Fact Sheet for Healthcare Providers: https://www.woods-mathews.com/  This test is not yet approved or cleared by the [place] FDA and  has been authorized for detection and/or diagnosis of SARS-CoV-2 by FDA under an Emergency Use Authorization (EUA). This EUA will remain  in effect (meaning this test can be used)  for the duration of the COVID-19 declaration under Se ction 564(b)(1) of the Act, 21 U.S.C. section 360bbb-3(b)(1), unless the authorization is terminated or revoked sooner.  Performed at [facility], [place] [address], [place], [place] [address]     Radiology Studies: DG Chest Port 1 View  Result Date: 09/04/2020 CLINICAL DATA:  Respiratory distress EXAM: PORTABLE CHEST 1 VIEW COMPARISON:  August 27, 2020 FINDINGS: There is cardiomegaly with pulmonary venous hypertension. There are pleural effusions bilaterally with bibasilar atelectasis. There is less interstitial edema compared to previous study, although there remains interstitial prominence felt to represent a degree of residual edema. No consolidation. There is aortic atherosclerosis. No adenopathy. No bone lesions. IMPRESSION: Cardiomegaly with pulmonary vascular congestion, stable. Pleural effusions, stable. Less interstitial edema. There may be partial but incomplete resolution of congestive heart failure. There is bibasilar atelectasis. Aortic Atherosclerosis (ICD10-I70.0). Electronically Signed   By: Lowella Grip III M.D.   On: 09/04/2020 09:17      Britney Captain T. [place]  If 7PM-7AM, please contact night-coverage [email or web address] 09/04/2020, 1:24 PM

## 2020-09-04 NOTE — Significant Event (Signed)
Rapid Response Event Note   Reason for Call :  Acute respiratory distress- SpO2 85% on room air  Initial Focused Assessment:  Pt lying in bed, AO. Disoriented to month. Able to speak in full sentences, although breathing appears heavy. No accessory muscle use. Lung sounds clear, delayed faint expiratory wheeze. Abdomen is soft. Skin is warm, dry, pink. Pt denies pain. Endorses breathing is heavy.   VS: T 97.48F, BP 132/48, HR 95, RR 22, SpO2 93% on 2LNC  Interventions:  -CXR  Plan of Care:  -Wean oxygen as pt tolerates -Suction at [facility] -Continuous pulse oximetry monitoring  Call rapid response for additional needs.   Event Summary:  MD Notified: Dr. Cyndia Skeeters Call Time: [phone] Arrival Time: [id number] End Time: 0840  Casimer Bilis, RN

## 2020-09-04 NOTE — TOC Progression Note (Addendum)
Transition of Care [facility]) - Progression Note    Patient Details  Name: Kristin Adkins MRN: [id number] Date of Birth: 1920/07/29  [facility] CM/SW Contact  Angelita Ingles, RN Phone Number: [phone]  09/04/2020, 10:13 AM  Clinical Narrative:    Insurance Josem Kaufmann is still in process. [place] is requesting a peer to peer with attending MD. Message has been sent to MD. [facility] will continue to follow.  [place] reports that claim for rehab has been denied. Appeal info has been given to son. Son states that he would want to appeal and he will call CM back later if he decides to appeal.  [place] with [facility] has been made aware of insurance denial. Facility will be able to accept the patient to a long term care bed on Tuesday 3/29.           Expected Discharge Plan and Services           Expected Discharge Date: 09/01/20                                     Social Determinants of Health (SDOH) Interventions    Readmission Risk Interventions No flowsheet data found.

## 2020-09-04 NOTE — Care Management Important Message (Signed)
Important Message  Patient Details  Name: Kristin Adkins MRN: [id number] Date of Birth: [birth year]   Medicare Important Message Given:  Yes     [name] 09/04/2020, 4:50 PM

## 2020-09-05 DIAGNOSIS — J9601 Acute respiratory failure with hypoxia: Secondary | ICD-10-CM | POA: Diagnosis not present

## 2020-09-05 LAB — GLUCOSE, CAPILLARY
Glucose-Capillary: 97 mg/dL (ref 70–99)
Glucose-Capillary: 108 mg/dL — ABNORMAL HIGH (ref 70–99)
Glucose-Capillary: 130 mg/dL — ABNORMAL HIGH (ref 70–99)

## 2020-09-05 LAB — SARS CORONAVIRUS 2 (TAT 6-24 HRS): SARS Coronavirus 2: NEGATIVE

## 2020-09-05 MED ORDER — TORSEMIDE 20 MG PO TABS: 20.0000 mg | ORAL_TABLET | Freq: Two times a day (BID) | ORAL | 0 refills | Status: AC

## 2020-09-05 NOTE — Progress Notes (Signed)
Report called to Hinton Dyer, RN at [facility].

## 2020-09-05 NOTE — Discharge Summary (Signed)
Physician Discharge Summary  Kristin Adkins [id number] DOB: [birth year] DOA: 08/27/2020  PCP: Raelene Bott, MD  Admit date: 08/27/2020 Discharge date: 09/05/2020  Admitted From: [facility] Disposition:  [facility]   Recommendations for Outpatient Follow-up:  1. Follow up with PCP in 1 week 2. Continue outpatient palliative care   Discharge Condition: Improved CODE STATUS: DNR  Diet recommendation: Heart healthy   Brief/Interim Summary: Kristin Adkins is a 87 year old F with PMH of dementia, combined CHF, PAF not on AC, CKD-4 and solitary kidney presenting with shortness of breath and admitted for acute hypoxemic respiratory failure due to acute on chronic CHF/recurrent pulmonary edema for the fifth time since 07/15/2020.  Treated with BiPAP and IV Lasix with improvement in her symptoms.  Transition to p.o. diuretics and remained stable.  Therapy recommended SNF but insurance recommended going back to LTC saying she is almost back to her recent baseline when she was moved from SNF to LTC at [facility] prior to this admission based on therapy reports. She had an event of desaturation on morning of 3/28 which improved. She overall has poor prognosis and should be followed closely by palliative care services, recommend transition to hospice to avoid repeat hospitalizations. On morning of discharge, she was asymptomatic and stable on room air.   Discharge Diagnoses:  Active Problems:   Acute on chronic combined systolic and diastolic CHF (congestive heart failure) (HCC)   DNR (do not resuscitate)   AF (paroxysmal atrial fibrillation) (HCC)   Chronic anticoagulation   Type 2 diabetes mellitus without complication ([place])   Acute kidney injury superimposed on chronic kidney disease ([place])   Pressure injury of skin   Acute hypoxic respiratory failure due to acute on chronic combined CHF/recurrent pulmonary edema.  TTE on 3/5 with LVEF of 30 to 35%, G1-DD, RVSP of 55.4. Diuresed with IV Lasix and  transitioned to PO torsemide and Aldactone. CXR with stable vascular congestion -Continue torsemide to 20 mg twice daily.  Continue Aldactone 12.5 mg daily -Encourage ongoing discussion with palliative care  Essential hypertension -Diuretics as above  Paroxysmal A. fib -On amiodarone and Eliquis  Controlled NIDDM-2 -A1c 5.6%  AKI on CKD-4 and patient with solitary kidney/azotemia -Improved  Dementia without behavioral disturbance -Reorientation and delirium precautions. -Avoid sedating medications  Goal of care -Appropriately DNR/DNI. Patient with advanced CHF/NYHA-stage-4 and recurrent admission for pulmonary edema.  Also with CKD-4 and hypotension which precludes diuresis.  Poor long-term prognosis.  Candidate for hospice.  Per patient's son, she is seen by palliative care at [facility].   -Encouraged ongoing discussion with palliative care when she returns to LTC      In agreement with assessment of the pressure ulcer as below:  Pressure Injury 08/27/20 Heel Right Stage 1 -  Intact skin with non-blanchable redness of a localized area usually over a bony prominence. (Active)  08/27/20 1600  Location: Heel  Location Orientation: Right  Staging: Stage 1 -  Intact skin with non-blanchable redness of a localized area usually over a bony prominence.  Wound Description (Comments):   Present on Admission: Yes     Pressure Injury 08/27/20 Sacrum Medial Stage 1 -  Intact skin with non-blanchable redness of a localized area usually over a bony prominence. (Active)  08/27/20 1600  Location: Sacrum  Location Orientation: Medial  Staging: Stage 1 -  Intact skin with non-blanchable redness of a localized area usually over a bony prominence.  Wound Description (Comments):   Present on Admission: Yes     Pressure  Injury 08/27/20 Nose Mid;Anterior Stage 1 -  Intact skin with non-blanchable redness of a localized area usually over a bony prominence. pink from previous bipap (Active)   08/27/20 1900  Location: Nose  Location Orientation: Mid;Anterior  Staging: Stage 1 -  Intact skin with non-blanchable redness of a localized area usually over a bony prominence.  Wound Description (Comments): pink from previous bipap  Present on Admission: Yes       Discharge Instructions  Discharge Instructions    (HEART FAILURE PATIENTS) Call MD:  Anytime you have any of the following symptoms: 1) 3 pound weight gain in 24 hours or 5 pounds in 1 week 2) shortness of breath, with or without a dry hacking cough 3) swelling in the hands, feet or stomach 4) if you have to sleep on extra pillows at night in order to breathe.   Complete by: As directed    Diet - low sodium heart healthy   Complete by: As directed    Increase activity slowly   Complete by: As directed    Increase activity slowly   Complete by: As directed    No wound care   Complete by: As directed    No wound care   Complete by: As directed      Allergies as of 09/05/2020      Reactions   Codeine    Other reaction(s): UNKNOWN   Nitrofurantoin    Other reaction(s): UNKNOWN   Penicillins Rash      Medication List    STOP taking these medications   hydrALAZINE 10 MG tablet Commonly known as: APRESOLINE   isosorbide mononitrate 30 MG 24 hr tablet Commonly known as: IMDUR     TAKE these medications   acetaminophen 500 MG tablet Commonly known as: TYLENOL Take 500 mg by mouth every 6 (six) hours as needed for fever or mild pain (discomfort).   albuterol (2.5 MG/3ML) 0.083% nebulizer solution Commonly known as: PROVENTIL Take 3 mLs (2.5 mg total) by nebulization every 6 (six) hours as needed for shortness of breath or wheezing.   amiodarone 100 MG tablet Commonly known as: PACERONE Take 100 mg by mouth daily.   apixaban 2.5 MG Tabs tablet Commonly known as: ELIQUIS Take 1 tablet (2.5 mg total) by mouth 2 (two) times daily.   DERMACLOUD EX Apply 1 application topically 3 (three) times daily. To  buttocks   famotidine 10 MG tablet Commonly known as: PEPCID Take 1 tablet (10 mg total) by mouth daily.   magnesium oxide 400 MG tablet Commonly known as: MAG-OX Take 400 mg by mouth daily.   melatonin 3 MG Tabs tablet Take 6 mg by mouth at bedtime.   multivitamin with minerals tablet Take 1 tablet by mouth daily.   NON FORMULARY Take 120 mLs by mouth 2 (two) times daily. Med Pass   NON FORMULARY Take 120 mLs by mouth in the morning, at noon, in the evening, and at bedtime. Fluids   potassium chloride SA 20 MEQ tablet Commonly known as: KLOR-CON Take 1 tablet (20 mEq total) by mouth daily.   spironolactone 25 MG tablet Commonly known as: ALDACTONE Take 12.5 mg by mouth daily.   torsemide 20 MG tablet Commonly known as: DEMADEX Take 1 tablet (20 mg total) by mouth 2 (two) times daily. What changed: when to take this       Follow-up Information    Raelene Bott, MD. Schedule an appointment as soon as possible for a visit in 1 week(s).  Specialty: Internal Medicine Contact information: [address] [place] [address] [phone]              Allergies  Allergen Reactions  . Codeine     Other reaction(s): UNKNOWN  . Nitrofurantoin     Other reaction(s): UNKNOWN  . Penicillins Rash    Consultations:  PCCM  Palliative care    Procedures/Studies: DG Chest Port 1 View  Result Date: 09/04/2020 CLINICAL DATA:  Respiratory distress EXAM: PORTABLE CHEST 1 VIEW COMPARISON:  August 27, 2020 FINDINGS: There is cardiomegaly with pulmonary venous hypertension. There are pleural effusions bilaterally with bibasilar atelectasis. There is less interstitial edema compared to previous study, although there remains interstitial prominence felt to represent a degree of residual edema. No consolidation. There is aortic atherosclerosis. No adenopathy. No bone lesions. IMPRESSION: Cardiomegaly with pulmonary vascular congestion, stable. Pleural  effusions, stable. Less interstitial edema. There may be partial but incomplete resolution of congestive heart failure. There is bibasilar atelectasis. Aortic Atherosclerosis (ICD10-I70.0). Electronically Signed   By: Lowella Grip III M.D.   On: 09/04/2020 09:17   DG Chest Portable 1 View  Result Date: 08/27/2020 CLINICAL DATA:  87 year old female with severe respiratory distress. EXAM: PORTABLE CHEST 1 VIEW COMPARISON:  Portable chest 08/11/2020 and earlier. FINDINGS: Portable AP semi upright view at 0732 hours. Stable cardiomegaly and mediastinal contours. Bilateral pleural effusions have not significantly changed. Diffuse pulmonary interstitial opacity has increased in both lungs. No pneumothorax. Visualized tracheal air column is within normal limits. No air bronchograms. Osteopenia. IMPRESSION: Increased diffuse pulmonary interstitial edema. Viral/atypical respiratory infection felt less likely. Bilateral pleural effusions with lung base atelectasis appear stable since 08/11/2020. Electronically Signed   By: Genevie Ann M.D.   On: 08/27/2020 07:50   DG Chest Portable 1 View  Result Date: 08/11/2020 CLINICAL DATA:  Shortness of breath EXAM: PORTABLE CHEST 1 VIEW COMPARISON:  07/31/2020 FINDINGS: Cardiac shadow is prominent but stable. Aortic calcifications are again identified. Bilateral pleural effusions are again identified relatively stable from the prior exam. Increasing vascular congestion and parenchymal edema is seen as well. No bony abnormality is noted. IMPRESSION: Changes consistent with increasing CHF and small bilateral pleural effusions. Electronically Signed   By: Inez Catalina M.D.   On: 08/11/2020 05:11   ECHOCARDIOGRAM COMPLETE  Result Date: 08/12/2020    ECHOCARDIOGRAM REPORT   Patient Name:   LAIMA BRUNKHORST Date of Exam: 08/12/2020 Medical Rec #:  [id number]      Height:       62.0 in Accession #:    [id number]     Weight:       157.4 lb Date of Birth:  12-03-1920     BSA:           1.727 m Patient Age:    41 years       BP:           126/56 mmHg Patient Gender: F              HR:           75 bpm. Exam Location:  Inpatient Procedure: 2D Echo, Cardiac Doppler and Color Doppler Indications:    CHF, CAD  History:        Patient has prior history of Echocardiogram examinations, most                 recent 07/18/2020. at [facility] CHF; CAD.  Sonographer:    Merrie Roof RDCS Referring Phys:  [id number] RAVI PAHWANI IMPRESSIONS  1. Left ventricular ejection fraction, by estimation, is 30 to 35%. The left ventricle has moderately decreased function. The left ventricle demonstrates global hypokinesis. The left ventricular internal cavity size was mildly dilated. Left ventricular diastolic parameters are consistent with Grade I diastolic dysfunction (impaired relaxation). Elevated left atrial pressure.  2. Right ventricular systolic function is normal. The right ventricular size is normal. There is moderately elevated pulmonary artery systolic pressure. The estimated right ventricular systolic pressure is 123XX123 mmHg.  3. Left atrial size was mild to moderately dilated.  4. The mitral valve is normal in structure. Mild to moderate mitral valve regurgitation.  5. Tricuspid valve regurgitation is mild to moderate.  6. The aortic valve is tricuspid. Aortic valve regurgitation is mild. Mild aortic valve sclerosis is present, with no evidence of aortic valve stenosis.  7. The inferior vena cava is normal in size with greater than 50% respiratory variability, suggesting right atrial pressure of 3 mmHg. Comparison(s): Prior images unable to be directly viewed, comparison made by report only. No significant change from prior study. FINDINGS  Left Ventricle: Left ventricular ejection fraction, by estimation, is 30 to 35%. The left ventricle has moderately decreased function. The left ventricle demonstrates global hypokinesis. The left ventricular internal cavity size was mildly dilated. There is no left ventricular  hypertrophy. Abnormal (paradoxical) septal motion, consistent with left bundle branch block. Left ventricular diastolic parameters are consistent with Grade I diastolic dysfunction (impaired relaxation). Elevated left atrial pressure. Right Ventricle: The right ventricular size is normal. No increase in right ventricular wall thickness. Right ventricular systolic function is normal. There is moderately elevated pulmonary artery systolic pressure. The tricuspid regurgitant velocity is 3.62 m/s, and with an assumed right atrial pressure of 3 mmHg, the estimated right ventricular systolic pressure is 123XX123 mmHg. Left Atrium: Left atrial size was mild to moderately dilated. Right Atrium: Right atrial size was normal in size. Pericardium: The pericardium was not well visualized. Mitral Valve: The mitral valve is normal in structure. Mild to moderate mitral valve regurgitation. Tricuspid Valve: The tricuspid valve is normal in structure. Tricuspid valve regurgitation is mild to moderate. Aortic Valve: The aortic valve is tricuspid. Aortic valve regurgitation is mild. Mild aortic valve sclerosis is present, with no evidence of aortic valve stenosis. Aortic valve mean gradient measures 4.0 mmHg. Aortic valve peak gradient measures 8.1 mmHg. Aortic valve area, by VTI measures 1.21 cm. Pulmonic Valve: The pulmonic valve was grossly normal. Pulmonic valve regurgitation is trivial. Aorta: The aortic root is normal in size and structure. Venous: The inferior vena cava is normal in size with greater than 50% respiratory variability, suggesting right atrial pressure of 3 mmHg. IAS/Shunts: No atrial level shunt detected by color flow Doppler.  LEFT VENTRICLE PLAX 2D LVIDd:         5.70 cm      Diastology LVIDs:         4.70 cm      LV e' medial:    3.26 cm/s LV PW:         1.10 cm      LV E/e' medial:  28.3 LV IVS:        1.10 cm      LV e' lateral:   4.68 cm/s LVOT diam:     2.00 cm      LV E/e' lateral: 19.7 LV SV:         38 LV  SV Index:   22 LVOT Area:  3.14 cm                              3D Volume EF: LV Volumes (MOD)            3D EF:        29 % LV vol d, MOD A2C: 116.0 ml LV EDV:       141 ml LV vol d, MOD A4C: 95.0 ml  LV ESV:       101 ml LV vol s, MOD A2C: 69.0 ml  LV SV:        40 ml LV vol s, MOD A4C: 83.6 ml LV SV MOD A2C:     47.0 ml LV SV MOD A4C:     95.0 ml LV SV MOD BP:      30.6 ml RIGHT VENTRICLE          IVC RV Basal diam:  3.70 cm  IVC diam: 1.30 cm LEFT ATRIUM             Index       RIGHT ATRIUM           Index LA diam:        3.80 cm 2.20 cm/m  RA Area:     10.80 cm LA Vol (A2C):   69.7 ml 40.37 ml/m RA Volume:   24.50 ml  14.19 ml/m LA Vol (A4C):   45.3 ml 26.24 ml/m LA Biplane Vol: 56.9 ml 32.95 ml/m  AORTIC VALVE AV Area (Vmax):    1.66 cm AV Area (Vmean):   1.39 cm AV Area (VTI):     1.21 cm AV Vmax:           142.00 cm/s AV Vmean:          97.500 cm/s AV VTI:            0.312 m AV Peak Grad:      8.1 mmHg AV Mean Grad:      4.0 mmHg LVOT Vmax:         75.10 cm/s LVOT Vmean:        43.200 cm/s LVOT VTI:          0.120 m LVOT/AV VTI ratio: 0.38  AORTA Ao Root diam: 2.80 cm MITRAL VALVE                 TRICUSPID VALVE MV Area (PHT): 4.06 cm      TR Peak grad:   52.4 mmHg MV Decel Time: 187 msec      TR Vmax:        362.00 cm/s MR Peak grad:    82.4 mmHg MR Vmax:         454.00 cm/s SHUNTS MR PISA:         1.01 cm    Systemic VTI:  0.12 m MR PISA Eff ROA: 7 mm       Systemic Diam: 2.00 cm MR PISA Radius:  0.40 cm MV E velocity: 92.10 cm/s MV A velocity: 118.00 cm/s MV E/A ratio:  0.78 Mihai Croitoru MD Electronically signed by Sanda Klein MD Signature Date/Time: 08/12/2020/5:26:21 PM    Final    VAS [place] LOWER EXTREMITY VENOUS (DVT)  Result Date: 08/14/2020  Lower Venous DVT Study Indications: Edema.  Limitations: Poor ultrasound/tissue interface. Comparison Study: No previous exams Performing Technologist: Rogelia Rohrer  Examination Guidelines: A complete evaluation includes B-mode imaging, spectral  Doppler, color  Doppler, and power Doppler as needed of all accessible portions of each vessel. Bilateral testing is considered an integral part of a complete examination. Limited examinations for reoccurring indications may be performed as noted. The reflux portion of the exam is performed with the patient in reverse Trendelenburg.  +---------+---------------+---------+-----------+----------+--------------+ RIGHT    CompressibilityPhasicitySpontaneityPropertiesThrombus Aging +---------+---------------+---------+-----------+----------+--------------+ CFV      Full           Yes      Yes                                 +---------+---------------+---------+-----------+----------+--------------+ SFJ      Full                                                        +---------+---------------+---------+-----------+----------+--------------+ FV Prox  Full           Yes      Yes                                 +---------+---------------+---------+-----------+----------+--------------+ FV Mid   Full           Yes      Yes                                 +---------+---------------+---------+-----------+----------+--------------+ FV DistalFull           Yes      Yes                                 +---------+---------------+---------+-----------+----------+--------------+ PFV      Full                                                        +---------+---------------+---------+-----------+----------+--------------+ POP      Full           Yes      Yes                                 +---------+---------------+---------+-----------+----------+--------------+ PTV      Full                                                        +---------+---------------+---------+-----------+----------+--------------+ PERO     Full                                                        +---------+---------------+---------+-----------+----------+--------------+    +---------+---------------+---------+-----------+----------+-------------------+ LEFT     CompressibilityPhasicitySpontaneityPropertiesThrombus Aging      +---------+---------------+---------+-----------+----------+-------------------+  CFV      Full           Yes      Yes                                      +---------+---------------+---------+-----------+----------+-------------------+ SFJ      Full                                                             +---------+---------------+---------+-----------+----------+-------------------+ FV Prox  Full           Yes      Yes                                      +---------+---------------+---------+-----------+----------+-------------------+ FV Mid   Full           Yes      Yes                                      +---------+---------------+---------+-----------+----------+-------------------+ FV DistalFull           Yes      Yes                                      +---------+---------------+---------+-----------+----------+-------------------+ PFV      Full                                                             +---------+---------------+---------+-----------+----------+-------------------+ POP      Full           Yes      Yes                                      +---------+---------------+---------+-----------+----------+-------------------+ PTV      Full                                         Not well visualized +---------+---------------+---------+-----------+----------+-------------------+   Left Technical Findings: Not visualized segments include Peroneal veins.   Summary: BILATERAL: - No evidence of deep vein thrombosis seen in the lower extremities, bilaterally. - No evidence of superficial venous thrombosis in the lower extremities, bilaterally. -No evidence of popliteal cyst, bilaterally.   *See table(s) above for measurements and observations. Electronically signed by Ruta Hinds MD  on 08/14/2020 at 9:19:04 PM.    Final        Discharge Exam: Vitals:   09/04/20 1200 09/04/20 1946  BP:  (!) 128/52  Pulse: 93 86  Resp: 20   Temp:  97.9 F (36.6 C)  SpO2: 99% 99%  General: Pt is alert, awake, not in acute distress Cardiovascular: S1/S2 +, no edema Respiratory: CTA bilaterally anteriorly, no wheezing, no rhonchi, no respiratory distress, no conversational dyspnea, on room air  Abdominal: Soft, NT, ND, bowel sounds + Extremities: no edema, no cyanosis Psych: Stable    The results of significant diagnostics from this hospitalization (including imaging, microbiology, ancillary and laboratory) are listed below for reference.     Microbiology: Recent Results (from the past 240 hour(s))  Resp Panel by RT-PCR (Flu A&B, Covid) Nasopharyngeal Swab     Status: None   Collection Time: 08/27/20  8:29 AM   Specimen: Nasopharyngeal Swab; Nasopharyngeal(NP) swabs in vial transport medium  Result Value Ref Range Status   SARS Coronavirus 2 by RT PCR NEGATIVE NEGATIVE Final    Comment: (NOTE) SARS-CoV-2 target nucleic acids are NOT DETECTED.  The SARS-CoV-2 RNA is generally detectable in upper respiratory specimens during the acute phase of infection. The lowest concentration of SARS-CoV-2 viral copies this assay can detect is 138 copies/mL. A negative result does not preclude SARS-Cov-2 infection and should not be used as the sole basis for treatment or other patient management decisions. A negative result may occur with  improper specimen collection/handling, submission of specimen other than nasopharyngeal swab, presence of viral mutation(s) within the areas targeted by this assay, and inadequate number of viral copies(<138 copies/mL). A negative result must be combined with clinical observations, patient history, and epidemiological information. The expected result is Negative.  Fact Sheet for Patients:  EntrepreneurPulse.com.au  Fact Sheet  for Healthcare Providers:  IncredibleEmployment.be  This test is no t yet approved or cleared by the [place] FDA and  has been authorized for detection and/or diagnosis of SARS-CoV-2 by FDA under an Emergency Use Authorization (EUA). This EUA will remain  in effect (meaning this test can be used) for the duration of the COVID-19 declaration under Section 564(b)(1) of the Act, 21 U.S.C.section 360bbb-3(b)(1), unless the authorization is terminated  or revoked sooner.       Influenza A by PCR NEGATIVE NEGATIVE Final   Influenza B by PCR NEGATIVE NEGATIVE Final    Comment: (NOTE) The Xpert Xpress SARS-CoV-2/FLU/RSV plus assay is intended as an aid in the diagnosis of influenza from Nasopharyngeal swab specimens and should not be used as a sole basis for treatment. Nasal washings and aspirates are unacceptable for Xpert Xpress SARS-CoV-2/FLU/RSV testing.  Fact Sheet for Patients: EntrepreneurPulse.com.au  Fact Sheet for Healthcare Providers: IncredibleEmployment.be  This test is not yet approved or cleared by the [place] FDA and has been authorized for detection and/or diagnosis of SARS-CoV-2 by FDA under an Emergency Use Authorization (EUA). This EUA will remain in effect (meaning this test can be used) for the duration of the COVID-19 declaration under Section 564(b)(1) of the Act, 21 U.S.C. section 360bbb-3(b)(1), unless the authorization is terminated or revoked.  Performed at [facility], [place] [address], [place], [place] [address]   MRSA PCR Screening     Status: Abnormal   Collection Time: 08/28/20  5:25 AM   Specimen: Nasopharyngeal  Result Value Ref Range Status   MRSA by PCR POSITIVE (A) NEGATIVE Final    Comment:        The GeneXpert MRSA Assay (FDA approved for NASAL specimens only), is one component of a comprehensive MRSA colonization surveillance program. It is not intended to diagnose  MRSA infection nor to guide or monitor treatment for MRSA infections. RESULT CALLED TO, READ BACK BY AND VERIFIED  WITH: RN L.TYSON AT [id number] ON 08/2020 BY T.SAAD Performed at [facility], [place] [address] [place], [place] [address]   SARS CORONAVIRUS 2 (TAT 6-24 HRS) Nasopharyngeal Nasopharyngeal Swab     Status: None   Collection Time: 08/31/20 12:53 PM   Specimen: Nasopharyngeal Swab  Result Value Ref Range Status   SARS Coronavirus 2 NEGATIVE NEGATIVE Final    Comment: (NOTE) SARS-CoV-2 target nucleic acids are NOT DETECTED.  The SARS-CoV-2 RNA is generally detectable in upper and lower respiratory specimens during the acute phase of infection. Negative results do not preclude SARS-CoV-2 infection, do not rule out co-infections with other pathogens, and should not be used as the sole basis for treatment or other patient management decisions. Negative results must be combined with clinical observations, patient history, and epidemiological information. The expected result is Negative.  Fact Sheet for Patients: SugarRoll.be  Fact Sheet for Healthcare Providers: https://www.woods-mathews.com/  This test is not yet approved or cleared by the [place] FDA and  has been authorized for detection and/or diagnosis of SARS-CoV-2 by FDA under an Emergency Use Authorization (EUA). This EUA will remain  in effect (meaning this test can be used) for the duration of the COVID-19 declaration under Se ction 564(b)(1) of the Act, 21 U.S.C. section 360bbb-3(b)(1), unless the authorization is terminated or revoked sooner.  Performed at [facility], [place] [address], [place], [place] [address]      Labs: BNP (last 3 results) Recent Labs    08/11/20 0536 08/27/20 0748 09/04/20 1156  BNP 1,300.9* 1,383.8* [id number]*   Basic Metabolic Panel: Recent Labs  Lab 08/30/20 0246 08/31/20 0314 09/01/20 0254 09/03/20 0034 09/04/20 1334   NA 136 135 136 134*  --   K 3.9 3.6 3.8 4.3 5.0  CL 102 100 101 101  --   CO2 '26 28 25 26  '$ --   GLUCOSE 95 110* 113* 111*  --   BUN 28* 30* 37* 38*  --   CREATININE 1.94* 2.16* 2.33* 1.99* 2.22*  CALCIUM 9.5 9.5 9.3 9.7  --   MG  --  1.8 1.9 1.8 1.9  PHOS  --   --   --  4.2  --    Liver Function Tests: Recent Labs  Lab 09/03/20 0034  ALBUMIN 2.9*   No results for input(s): LIPASE, AMYLASE in the last 168 hours. No results for input(s): AMMONIA in the last 168 hours. CBC: Recent Labs  Lab 09/04/20 1156  WBC 5.9  HGB 12.6  HCT 38.5  MCV 85.2  PLT 133*   Cardiac Enzymes: No results for input(s): CKTOTAL, CKMB, CKMBINDEX, TROPONINI in the last 168 hours. BNP: Invalid input(s): POCBNP CBG: Recent Labs  Lab 09/04/20 0718 09/04/20 1153 09/04/20 1605 09/04/20 1947 09/05/20 0742  GLUCAP 111* 133* 110* 107* 97   D-Dimer No results for input(s): DDIMER in the last 72 hours. Hgb A1c Recent Labs    09/03/20 0034  HGBA1C 5.6   Lipid Profile No results for input(s): CHOL, HDL, LDLCALC, TRIG, CHOLHDL, LDLDIRECT in the last 72 hours. Thyroid function studies No results for input(s): TSH, T4TOTAL, T3FREE, THYROIDAB in the last 72 hours.  Invalid input(s): FREET3 Anemia work up No results for input(s): VITAMINB12, FOLATE, FERRITIN, TIBC, IRON, RETICCTPCT in the last 72 hours. Urinalysis    Component Value Date/Time   COLORURINE YELLOW 07/30/2020 0906   APPEARANCEUR CLEAR 07/30/2020 0906   LABSPEC 1.006 07/30/2020 0906   PHURINE 5.0 07/30/2020 0906   GLUCOSEU  NEGATIVE 07/30/2020 0906   HGBUR NEGATIVE 07/30/2020 0906   BILIRUBINUR NEGATIVE 07/30/2020 0906   KETONESUR NEGATIVE 07/30/2020 0906   PROTEINUR NEGATIVE 07/30/2020 0906   NITRITE NEGATIVE 07/30/2020 0906   LEUKOCYTESUR NEGATIVE 07/30/2020 0906   Sepsis Labs Invalid input(s): PROCALCITONIN,  WBC,  LACTICIDVEN Microbiology Recent Results (from the past 240 hour(s))  Resp Panel by RT-PCR (Flu A&B,  Covid) Nasopharyngeal Swab     Status: None   Collection Time: 08/27/20  8:29 AM   Specimen: Nasopharyngeal Swab; Nasopharyngeal(NP) swabs in vial transport medium  Result Value Ref Range Status   SARS Coronavirus 2 by RT PCR NEGATIVE NEGATIVE Final    Comment: (NOTE) SARS-CoV-2 target nucleic acids are NOT DETECTED.  The SARS-CoV-2 RNA is generally detectable in upper respiratory specimens during the acute phase of infection. The lowest concentration of SARS-CoV-2 viral copies this assay can detect is 138 copies/mL. A negative result does not preclude SARS-Cov-2 infection and should not be used as the sole basis for treatment or other patient management decisions. A negative result may occur with  improper specimen collection/handling, submission of specimen other than nasopharyngeal swab, presence of viral mutation(s) within the areas targeted by this assay, and inadequate number of viral copies(<138 copies/mL). A negative result must be combined with clinical observations, patient history, and epidemiological information. The expected result is Negative.  Fact Sheet for Patients:  EntrepreneurPulse.com.au  Fact Sheet for Healthcare Providers:  IncredibleEmployment.be  This test is no t yet approved or cleared by the [place] FDA and  has been authorized for detection and/or diagnosis of SARS-CoV-2 by FDA under an Emergency Use Authorization (EUA). This EUA will remain  in effect (meaning this test can be used) for the duration of the COVID-19 declaration under Section 564(b)(1) of the Act, 21 U.S.C.section 360bbb-3(b)(1), unless the authorization is terminated  or revoked sooner.       Influenza A by PCR NEGATIVE NEGATIVE Final   Influenza B by PCR NEGATIVE NEGATIVE Final    Comment: (NOTE) The Xpert Xpress SARS-CoV-2/FLU/RSV plus assay is intended as an aid in the diagnosis of influenza from Nasopharyngeal swab specimens and should  not be used as a sole basis for treatment. Nasal washings and aspirates are unacceptable for Xpert Xpress SARS-CoV-2/FLU/RSV testing.  Fact Sheet for Patients: EntrepreneurPulse.com.au  Fact Sheet for Healthcare Providers: IncredibleEmployment.be  This test is not yet approved or cleared by the [place] FDA and has been authorized for detection and/or diagnosis of SARS-CoV-2 by FDA under an Emergency Use Authorization (EUA). This EUA will remain in effect (meaning this test can be used) for the duration of the COVID-19 declaration under Section 564(b)(1) of the Act, 21 U.S.C. section 360bbb-3(b)(1), unless the authorization is terminated or revoked.  Performed at [facility], [place] [address], [place], [place] [address]   MRSA PCR Screening     Status: Abnormal   Collection Time: 08/28/20  5:25 AM   Specimen: Nasopharyngeal  Result Value Ref Range Status   MRSA by PCR POSITIVE (A) NEGATIVE Final    Comment:        The GeneXpert MRSA Assay (FDA approved for NASAL specimens only), is one component of a comprehensive MRSA colonization surveillance program. It is not intended to diagnose MRSA infection nor to guide or monitor treatment for MRSA infections. RESULT CALLED TO, READ BACK BY AND VERIFIED WITH: RN L.TYSON AT 0855 ON 08/2020 BY T.SAAD Performed at [facility], [place] [address], [place], [place] [address]  SARS CORONAVIRUS 2 (TAT 6-24 HRS) Nasopharyngeal Nasopharyngeal Swab     Status: None   Collection Time: 08/31/20 12:53 PM   Specimen: Nasopharyngeal Swab  Result Value Ref Range Status   SARS Coronavirus 2 NEGATIVE NEGATIVE Final    Comment: (NOTE) SARS-CoV-2 target nucleic acids are NOT DETECTED.  The SARS-CoV-2 RNA is generally detectable in upper and lower respiratory specimens during the acute phase of infection. Negative results do not preclude SARS-CoV-2 infection, do not rule out co-infections with  other pathogens, and should not be used as the sole basis for treatment or other patient management decisions. Negative results must be combined with clinical observations, patient history, and epidemiological information. The expected result is Negative.  Fact Sheet for Patients: SugarRoll.be  Fact Sheet for Healthcare Providers: https://www.woods-mathews.com/  This test is not yet approved or cleared by the [place] FDA and  has been authorized for detection and/or diagnosis of SARS-CoV-2 by FDA under an Emergency Use Authorization (EUA). This EUA will remain  in effect (meaning this test can be used) for the duration of the COVID-19 declaration under Se ction 564(b)(1) of the Act, 21 U.S.C. section 360bbb-3(b)(1), unless the authorization is terminated or revoked sooner.  Performed at [facility], [place] [address] [place],  [address]      Patient was seen and examined on the day of discharge and was found to be in stable condition. Time coordinating discharge: 35 minutes including assessment and coordination of care, as well as examination of the patient.   SIGNED:  Dessa Phi, DO [facility] 09/05/2020, 10:19 AM

## 2020-09-05 NOTE — TOC Transition Note (Signed)
Transition of Care [facility]) - CM/SW Discharge Note   Patient Details  Name: Kristin Adkins MRN: [id number] Date of Birth: 1920/10/17  [facility]) CM/SW Contact:  Angelita Ingles, RN Phone Number: [phone]  09/05/2020, 3:54 PM   Clinical Narrative:    Patient to be discharged to [facility]. Transportation has been set up via [place]. Bedside nurse and son updated. Discharge packet at nurses station. No other needs noted. TOC will sign off.          Patient Goals and CMS Choice        Discharge Placement                       Discharge Plan and Services                                     Social Determinants of Health (SDOH) Interventions     Readmission Risk Interventions No flowsheet data found.

## 2020-09-19 ENCOUNTER — Other Ambulatory Visit: Payer: Self-pay

## 2020-09-19 ENCOUNTER — Non-Acute Institutional Stay: Payer: Medicare Other | Admitting: Hospice

## 2020-09-19 ENCOUNTER — Encounter: Payer: Self-pay | Admitting: Hospice

## 2020-09-19 VITALS — BP 118/87 | HR 63 | Temp 97.7°F | Resp 18 | Ht 62.0 in | Wt 142.4 lb

## 2020-09-19 DIAGNOSIS — F419 Anxiety disorder, unspecified: Secondary | ICD-10-CM

## 2020-09-19 DIAGNOSIS — I5043 Acute on chronic combined systolic (congestive) and diastolic (congestive) heart failure: Secondary | ICD-10-CM

## 2020-09-19 DIAGNOSIS — F0390 Unspecified dementia without behavioral disturbance: Secondary | ICD-10-CM

## 2020-09-19 DIAGNOSIS — Z515 Encounter for palliative care: Secondary | ICD-10-CM

## 2020-09-19 NOTE — Progress Notes (Signed)
[facility] Palliative Care Consult Note Telephone: [phone]  Fax: [phone]  PATIENT NAME: Kristin Adkins [address] [place] [address] [phone] (home)  DOB: [birth year] MRN: [id number]  PRIMARY CARE PROVIDER:    Raelene Bott, MD,  [address]  [address] [phone]  REFERRING PROVIDER:   Dr. Leanna Adkins  RESPONSIBLE PARTY:   Kristin Adkins is RP [phone] Contact Information    Name Relation Home Work Mobile   Kristin Adkins   [phone]      I met face to face with patient in SNF facility. Palliative Care was asked to follow this patient by consultation request of Dr. Leanna Adkins to address advance care planning and complex medical decision making. This is the initial visit.  NP called Kristin Adkins and updated him with visit.  He provided valuable information-past medical history, CODE STATUS, goals of care, and family history.  He endorsed palliative service and expressed appreciation for the call.    ASSESSMENT AND / RECOMMENDATIONS:   Advance Care Planning/Goals of Care: Goals include to maximize quality of life and symptom management. Our advance care planning conversation included a discussion about:     The value and importance of advance care planning   Difference between Hospice and Palliative care  Exploration of goals of care in the event of a sudden injury or illness   Identification and preparation of a healthcare agent   Review and updating or creation of an  advance directive document .  Decision not to resuscitate or to de-escalate disease focused treatments due to poor prognosis.  CODE STATUS: Patient is a DO NOT RESUSCITATE.  Signed DNR in epic.  I spent 46 minutes providing this initial consultation. More than 50% of the time in this consultation was spent on counseling and coordinating  communication. --------------------------------------------------------------------------------------------------------------------------------------  Symptom Management/Plan: Combined CHF: Continue torsemide 20 mg twice daily, spironolactone 25 mg daily.  If with weight increase of 3 pounds a day or 5 pounds in a week, give additional dose of torsemide 20 mg and notify provider.  Routine CBC BMP.check weights 3 times a week or more often as needed. Continue amiodarone, Imdur as ordered.  Elevation of extremities as much as possible during the day.  No added salt.  Cardiologist consult as needed. Weakness: Continue PT/OT.  Balance of rest and performance activity Anxiety: Stable.  Continue Ativan as ordered.  Encourage calm approach, distraction, de-escalation techniques. Dementia: Memory loss and confusion is ongoing in line with Dementia disease trajectory, FAST 6D, incontinent of bowel and bladder.  Continue ongoing supportive care.  Patient is compliant with care, per nursing staff.  Follow up Palliative Care Visit: Palliative care will continue to follow for complex medical decision making, advance care planning, and clarification of goals. Return 6 weeks or prn.  PPS: 40%  HOSPICE ELIGIBILITY/DIAGNOSIS: TBD  Chief Complaint: congestive heart failure  HISTORY OF PRESENT ILLNESS:  Kristin Adkins is a 87 y.o. year old female  with multiple medical problems including combined diastolic and systolic congestive heart failure which has been going on greater than 3 years, with exacerbation last month resulting in hospitalization 3/20-3/29/2022.  Patient was hospitalized for Acute hypoxemic respiratory failure related to congestive heart failure. Patient endorses weakness related to CHF, and reduced overall physical function and quality of life. Six hospitalizations for CHF related issues in the past 6 months. Ongoing diuretics and physical therapy is helpful.  History of dementia, CKD4, anxiety,  Paroxysmal A. Fib. History obtained from review of EMR, discussion with primary team, and interview with family, facility staff/caregiver and/or Kristin Adkins.   Review of lab tests/diagnostics   Results for Kristin Adkins (MRN [id number]) as of 09/19/2020 12:35  Ref. Range 09/03/2020 00:34 09/04/2020 13:34  Sodium Latest Ref Range: 135 - 145 mmol/L 134 (L)   Potassium Latest Ref Range: 3.5 - 5.1 mmol/L 4.3 5.0  Chloride Latest Ref Range: 98 - 111 mmol/L 101   CO2 Latest Ref Range: 22 - 32 mmol/L 26   Glucose Latest Ref Range: 70 - 99 mg/dL 111 (H)   Mean Plasma Glucose Latest Units: mg/dL 114.02   BUN Latest Ref Range: 8 - 23 mg/dL 38 (H)   Creatinine Latest Ref Range: 0.44 - 1.00 mg/dL 1.99 (H) 2.22 (H)  Calcium Latest Ref Range: 8.9 - 10.3 mg/dL 9.7   Anion gap Latest Ref Range: 5 - 15  7   Phosphorus Latest Ref Range: 2.5 - 4.6 mg/dL 4.2   Magnesium Latest Ref Range: 1.7 - 2.4 mg/dL 1.8 1.9  Albumin Latest Ref Range: 3.5 - 5.0 g/dL 2.9 (L)   GFR, Estimated Latest Ref Range: >60 mL/min 22 (L) 20 (L)   ROS General: NAD EYES: denies vision changes ENMT: denies dysphagia Cardiovascular: denies chest pain Pulmonary: denies cough, denies increased SOB Abdomen: endorses good appetite, denies constipation, endorses continence of bowel GU: denies dysuria MSK:  Endorsed weakness,  Skin: denies rashes or wounds Neurological: denies pain, denies insomnia Psych: Endorses positive mood Heme/lymph/immuno: denies bruises, abnormal bleeding  Physical Exam: Height/Weight: 5 feet 2 inches 142. 4 pounds Constitutional: NAD General: Well groomed EYES: anicteric sclera, lids intact, no discharge  ENMT: intact hearing, moist mucous membranes CV: S1 S2, RRR, 1+ pitting edema Pulmonary: LCTA, no increased work of breathing, on room air Abdomen: active BS + 4 quadrants, soft and non tender GU: No suprapubic tenderness MSK:  moves all extremities, gets around on her wheelchair Skin: warm and dry, no  rashes or wounds on visible skin Neuro:  weakness, otherwise non-focal, memory loss Psych: non-anxious affect,  Hem/lymph/immuno: no widespread bruising   CURRENT PROBLEM LIST:  Patient Active Problem List   Diagnosis Date Noted  . NSTEMI (non-ST elevated myocardial infarction) ([place]) 08/15/2020  . Pressure injury of skin 08/12/2020  . SIRS (systemic inflammatory response syndrome) ([place]) 08/11/2020  . Acute kidney injury superimposed on chronic kidney disease ([place]) 08/11/2020  . Acute on chronic combined systolic and diastolic CHF (congestive heart failure) ([place]) 07/30/2020  . DNR (do not resuscitate) 07/30/2020  . Acute on chronic respiratory failure with hypoxia and hypercapnia ([place]) 07/30/2020  . AF (paroxysmal atrial fibrillation) ([place]) 07/30/2020  . Chronic anticoagulation 07/30/2020  . History of COVID-19 07/30/2020  . Type 2 diabetes mellitus without complication ([place]) 67/89/3810  . Prolonged QT interval 07/30/2020  . Acute lower UTI 07/30/2020  . Respiratory failure ([place])    PAST MEDICAL HISTORY:  Active Ambulatory Problems    Diagnosis Date Noted  . Respiratory failure ([place])   . Acute on chronic combined systolic and diastolic CHF (congestive heart failure) ([place]) 07/30/2020  . DNR (do not resuscitate) 07/30/2020  . Acute on chronic respiratory failure with hypoxia and hypercapnia ([place]) 07/30/2020  . AF (paroxysmal atrial fibrillation) ([place]) 07/30/2020  . Chronic anticoagulation 07/30/2020  . History of COVID-19 07/30/2020  . Type 2 diabetes mellitus without complication ([place]) 17/51/0258  . Prolonged QT interval 07/30/2020  . Acute lower UTI 07/30/2020  . SIRS (systemic inflammatory  response syndrome) ([place]) 08/11/2020  . Acute kidney injury superimposed on chronic kidney disease ([place]) 08/11/2020  . Pressure injury of skin 08/12/2020  . NSTEMI (non-ST elevated myocardial infarction) ([place]) 08/15/2020   Resolved Ambulatory Problems    Diagnosis Date Noted  . Transient  hypotension 07/30/2020  . Acute respiratory failure with hypoxia ([place]) 08/11/2020  . Acute respiratory failure with hypoxia ([place]) 08/27/2020   Past Medical History:  Diagnosis Date  . AF (atrial fibrillation) ([place])   . CHF (congestive heart failure) ([place])   . CKD (chronic kidney disease), stage IV ([place])   . COPD (chronic obstructive pulmonary disease) ([place])   . Coronary artery disease   . Hypertension   . Myocardial infarction [facility])    SOCIAL HX:  Social History   Tobacco Use  . Smoking status: Never Smoker  . Smokeless tobacco: Never Used  Substance Use Topics  . Alcohol use: Never   FAMILY HX:  Father: Heart failure Mother: Type 2 DM   ALLERGIES:  Allergies  Allergen Reactions  . Codeine     Other reaction(s): UNKNOWN  . Nitrofurantoin     Other reaction(s): UNKNOWN  . Penicillins Rash     PERTINENT MEDICATIONS:  Outpatient Encounter Medications as of 09/19/2020  Medication Sig  . acetaminophen (TYLENOL) 500 MG tablet Take 500 mg by mouth every 6 (six) hours as needed for fever or mild pain (discomfort).  Marland Kitchen albuterol (PROVENTIL) (2.5 MG/3ML) 0.083% nebulizer solution Take 3 mLs (2.5 mg total) by nebulization every 6 (six) hours as needed for shortness of breath or wheezing.  Marland Kitchen amiodarone (PACERONE) 100 MG tablet Take 100 mg by mouth daily.  Marland Kitchen apixaban (ELIQUIS) 2.5 MG TABS tablet Take 1 tablet (2.5 mg total) by mouth 2 (two) times daily.  . famotidine (PEPCID) 10 MG tablet Take 1 tablet (10 mg total) by mouth daily.  . Infant Care Products (DERMACLOUD EX) Apply 1 application topically 3 (three) times daily. To buttocks  . magnesium oxide (MAG-OX) 400 MG tablet Take 400 mg by mouth daily.  . melatonin 3 MG TABS tablet Take 6 mg by mouth at bedtime.  . Multiple Vitamins-Minerals (MULTIVITAMIN WITH MINERALS) tablet Take 1 tablet by mouth daily.  . NON FORMULARY Take 120 mLs by mouth 2 (two) times daily. Med Pass  . NON FORMULARY Take 120 mLs by mouth in the morning, at  noon, in the evening, and at bedtime. Fluids  . potassium chloride SA (KLOR-CON) 20 MEQ tablet Take 1 tablet (20 mEq total) by mouth daily.  Marland Kitchen spironolactone (ALDACTONE) 25 MG tablet Take 12.5 mg by mouth daily.  Marland Kitchen torsemide (DEMADEX) 20 MG tablet Take 1 tablet (20 mg total) by mouth 2 (two) times daily.   No facility-administered encounter medications on file as of 09/19/2020.    Thank you for the opportunity to participate in the care of Ms. Bottger.  The palliative care team will continue to follow. Please call our office at [phone] if we can be of additional assistance.   Note: Portions of this note were generated with Lobbyist. Dictation errors may occur despite best attempts at proofreading.  Teodoro Spray, NP

## 2020-09-25 ENCOUNTER — Emergency Department (HOSPITAL_COMMUNITY): Payer: Medicare Other

## 2020-09-25 ENCOUNTER — Emergency Department (HOSPITAL_COMMUNITY)
Admission: EM | Admit: 2020-09-25 | Discharge: 2020-09-26 | Disposition: A | Discharge status: Home / Self Care | Payer: Medicare Other | Attending: Emergency Medicine | Admitting: Emergency Medicine

## 2020-09-25 DIAGNOSIS — I5043 Acute on chronic combined systolic (congestive) and diastolic (congestive) heart failure: Secondary | ICD-10-CM | POA: Diagnosis not present

## 2020-09-25 DIAGNOSIS — R0602 Shortness of breath: Secondary | ICD-10-CM | POA: Diagnosis present

## 2020-09-25 DIAGNOSIS — I252 Old myocardial infarction: Secondary | ICD-10-CM | POA: Insufficient documentation

## 2020-09-25 DIAGNOSIS — I2510 Atherosclerotic heart disease of native coronary artery without angina pectoris: Secondary | ICD-10-CM | POA: Insufficient documentation

## 2020-09-25 DIAGNOSIS — Z20822 Contact with and (suspected) exposure to covid-19: Secondary | ICD-10-CM | POA: Diagnosis not present

## 2020-09-25 DIAGNOSIS — Z8616 Personal history of COVID-19: Secondary | ICD-10-CM | POA: Diagnosis not present

## 2020-09-25 DIAGNOSIS — I130 Hypertensive heart and chronic kidney disease with heart failure and stage 1 through stage 4 chronic kidney disease, or unspecified chronic kidney disease: Secondary | ICD-10-CM | POA: Insufficient documentation

## 2020-09-25 DIAGNOSIS — E119 Type 2 diabetes mellitus without complications: Secondary | ICD-10-CM | POA: Diagnosis not present

## 2020-09-25 DIAGNOSIS — R079 Chest pain, unspecified: Secondary | ICD-10-CM | POA: Diagnosis not present

## 2020-09-25 DIAGNOSIS — I509 Heart failure, unspecified: Secondary | ICD-10-CM

## 2020-09-25 DIAGNOSIS — N184 Chronic kidney disease, stage 4 (severe): Secondary | ICD-10-CM | POA: Insufficient documentation

## 2020-09-25 DIAGNOSIS — J449 Chronic obstructive pulmonary disease, unspecified: Secondary | ICD-10-CM | POA: Diagnosis not present

## 2020-09-25 DIAGNOSIS — R112 Nausea with vomiting, unspecified: Secondary | ICD-10-CM | POA: Insufficient documentation

## 2020-09-25 LAB — COMPREHENSIVE METABOLIC PANEL
ALT: 16 U/L (ref 0–44)
AST: 25 U/L (ref 15–41)
Albumin: 3.1 g/dL — ABNORMAL LOW (ref 3.5–5.0)
Alkaline Phosphatase: 72 U/L (ref 38–126)
Anion gap: 8 (ref 5–15)
BUN: 36 mg/dL — ABNORMAL HIGH (ref 8–23)
CO2: 22 mmol/L (ref 22–32)
Calcium: 9.8 mg/dL (ref 8.9–10.3)
Chloride: 104 mmol/L (ref 98–111)
Creatinine, Ser: 2.49 mg/dL — ABNORMAL HIGH (ref 0.44–1.00)
GFR, Estimated: 17 mL/min — ABNORMAL LOW (ref >60)
Glucose, Bld: 150 mg/dL — ABNORMAL HIGH (ref 70–99)
Potassium: 4.7 mmol/L (ref 3.5–5.1)
Sodium: 134 mmol/L — ABNORMAL LOW (ref 135–145)
Total Bilirubin: 0.8 mg/dL (ref 0.3–1.2)
Total Protein: 6.4 g/dL — ABNORMAL LOW (ref 6.5–8.1)

## 2020-09-25 LAB — PROTIME-INR
INR: 1.2 (ref 0.8–1.2)
Prothrombin Time: 14.8 seconds (ref 11.4–15.2)

## 2020-09-25 LAB — CBC WITH DIFFERENTIAL/PLATELET
Abs Immature Granulocytes: 0.08 10*3/uL — ABNORMAL HIGH (ref 0.00–0.07)
Basophils Absolute: 0 10*3/uL (ref 0.0–0.1)
Basophils Relative: 0 %
Eosinophils Absolute: 0 10*3/uL (ref 0.0–0.5)
Eosinophils Relative: 0 %
HCT: 37.8 % (ref 36.0–46.0)
Hemoglobin: 11.8 g/dL — ABNORMAL LOW (ref 12.0–15.0)
Immature Granulocytes: 1 %
Lymphocytes Relative: 5 %
Lymphs Abs: 0.5 10*3/uL — ABNORMAL LOW (ref 0.7–4.0)
MCH: 27.6 pg (ref 26.0–34.0)
MCHC: 31.2 g/dL (ref 30.0–36.0)
MCV: 88.3 fL (ref 80.0–100.0)
Monocytes Absolute: 1.2 10*3/uL — ABNORMAL HIGH (ref 0.1–1.0)
Monocytes Relative: 11 %
Neutro Abs: 9.6 10*3/uL — ABNORMAL HIGH (ref 1.7–7.7)
Neutrophils Relative %: 83 %
Platelets: 109 10*3/uL — ABNORMAL LOW (ref 150–400)
RBC: 4.28 MIL/uL (ref 3.87–5.11)
RDW: 14.6 % (ref 11.5–15.5)
WBC: 11.4 10*3/uL — ABNORMAL HIGH (ref 4.0–10.5)
nRBC: 0 % (ref 0.0–0.2)

## 2020-09-25 LAB — TROPONIN I (HIGH SENSITIVITY): Troponin I (High Sensitivity): 309 ng/L (ref <18)

## 2020-09-25 LAB — LIPASE, BLOOD: Lipase: 30 U/L (ref 11–51)

## 2020-09-25 LAB — BRAIN NATRIURETIC PEPTIDE: B Natriuretic Peptide: 2203.3 pg/mL — ABNORMAL HIGH (ref 0.0–100.0)

## 2020-09-25 MED ORDER — FUROSEMIDE 10 MG/ML IJ SOLN
20.0000 mg | Freq: Once | INTRAMUSCULAR | Status: AC
  Administered 2020-09-25: 20 mg via INTRAVENOUS
  Filled 2020-09-25: qty 2

## 2020-09-25 NOTE — ED Notes (Signed)
IV attempted x2 - will put in IV team consult

## 2020-09-25 NOTE — Progress Notes (Signed)
IV team arrived to place IV after orders entered for IV meds.  ED staff placing PIV and IV team no longer needed for start.  Consult completed

## 2020-09-25 NOTE — ED Triage Notes (Signed)
Pt BIB EMS from [facility]. EMS reports that pt had nausea, vomiting, SOB, and CP that started a couple of hours ago. Pt had an episode of throwing up and that is when the CP started. Nursing home placed pt on 2L [place] since initial sats were 83% RA.  EMS gave Nitro and Phenergan en route.  Pt has hx of CHF - has bilateral edema in legs. Pt is confused at baseline but able to communicate needs - pt is also hard of hearing.  VS in EMS  118/60 HR 90  96% on 2L  CBG 189 - pt is diabetic

## 2020-09-25 NOTE — ED Notes (Signed)
Dr. Laverta [place] notified of troponin level

## 2020-09-25 NOTE — ED Notes (Signed)
Pt son at bedside - is POA - signed MSE

## 2020-09-25 NOTE — ED Provider Notes (Signed)
[facility] EMERGENCY DEPARTMENT Provider Note   CSN: [id number] Arrival date & time: 09/25/20  2034     History Chief Complaint  Patient presents with  . Chest Pain  . Shortness of Breath  . Nausea  . Emesis    Kristin Adkins is a 87 y.o. female.  HPI   87 year old female with past medical history of dementia, HTN, CHF on diuretic, CKD, paroxysmal atrial fibrillation on anticoagulation presents to the emergency department after a reported episode of chest pain, shortness of breath, hypoxia associated with nausea and 1 episode of vomiting.  Patient is from a facility.  She is hard of hearing, pleasant but a poor historian.  She is accompanied by family member.  Information per nursing documents and family member.  She reportedly after emesis was complaining of diffuse chest heaviness and shortness of breath, found to be hypoxic up to about 86% on room air which is her baseline.  She was placed on 2 L nasal cannula and referred here for evaluation.  On my initial evaluation patient states that she feels "back to normal".  Family member states that she does suffer from severe dementia, does not appear to be a reliable historian.  Patient had reportedly gained about 4 pounds in the past couple days with some trace foot edema.  No other recent illness/fever/cough.  Past Medical History:  Diagnosis Date  . AF (atrial fibrillation) ([place])   . CHF (congestive heart failure) ([place])   . CKD (chronic kidney disease), stage IV ([place])   . COPD (chronic obstructive pulmonary disease) ([place])   . Coronary artery disease   . Hypertension   . Myocardial infarction ([place])   . Respiratory failure [facility])     Patient Active Problem List   Diagnosis Date Noted  . NSTEMI (non-ST elevated myocardial infarction) ([place]) 08/15/2020  . Pressure injury of skin 08/12/2020  . SIRS (systemic inflammatory response syndrome) ([place]) 08/11/2020  . Acute kidney injury superimposed on chronic kidney  disease ([place]) 08/11/2020  . Acute on chronic combined systolic and diastolic CHF (congestive heart failure) ([place]) 07/30/2020  . DNR (do not resuscitate) 07/30/2020  . Acute on chronic respiratory failure with hypoxia and hypercapnia ([place]) 07/30/2020  . AF (paroxysmal atrial fibrillation) ([place]) 07/30/2020  . Chronic anticoagulation 07/30/2020  . History of COVID-19 07/30/2020  . Type 2 diabetes mellitus without complication ([place]) [id number]  . Prolonged QT interval 07/30/2020  . Acute lower UTI 07/30/2020  . Respiratory failure [facility])     Past Surgical History:  Procedure Laterality Date  . 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     Family History  Problem Relation Age of Onset  . Diabetes Mellitus II Mother   . Heart failure Father     Social History   Tobacco Use  . Smoking status: Never Smoker  . Smokeless tobacco: Never Used  Substance Use Topics  . Alcohol use: Never    Home Medications Prior to Admission medications   Medication Sig Start Date End Date Taking? Authorizing Provider  acetaminophen (TYLENOL) 500 MG tablet Take 500 mg by mouth every 6 (six) hours as needed for fever or mild pain (discomfort).    [provider]  albuterol (PROVENTIL) (2.5 MG/3ML) 0.083% nebulizer solution Take 3 mLs (2.5 mg total) by nebulization every 6 (six) hours as needed for shortness of breath or  wheezing. 08/02/20   Geradine Girt, DO  amiodarone (PACERONE) 100 MG tablet Take 100 mg by mouth daily. 07/19/20 08/01/21  [provider]  apixaban (ELIQUIS) 2.5 MG TABS tablet Take 1 tablet (2.5 mg total) by mouth 2 (two) times daily. 08/02/20   Geradine Girt, DO  famotidine (PEPCID) 10 MG tablet Take 1 tablet (10 mg total) by mouth daily. 08/02/20 08/02/21  Geradine Girt, DO  Inf[employer] [facility] EX) Apply 1 application topically 3 (three) times daily. To  buttocks    [provider]  magnesium oxide (MAG-OX) 400 MG tablet Take 400 mg by mouth daily. 07/20/20 07/20/21  [provider]  melatonin 3 MG TABS tablet Take 6 mg by mouth at bedtime. 07/19/20   [provider]  Multiple Vitamins-Minerals (MULTIVITAMIN WITH MINERALS) tablet Take 1 tablet by mouth daily.    [provider]  NON FORMULARY Take 120 mLs by mouth 2 (two) times daily. Med [place]    [provider]  NON FORMULARY Take 120 mLs by mouth in the morning, at noon, in the evening, and at bedtime. Fluids    [provider]  potassium chloride SA (KLOR-CON) 20 MEQ tablet Take 1 tablet (20 mEq total) by mouth daily. 08/02/20   Geradine Girt, DO  spironolactone (ALDACTONE) 25 MG tablet Take 12.5 mg by mouth daily.    [provider]  torsemide (DEMADEX) 20 MG tablet Take 1 tablet (20 mg total) by mouth 2 (two) times daily. 09/05/20 10/05/20  Dessa Phi, DO    Allergies    Codeine, Nitrofurantoin, and Penicillins  Review of Systems   Review of Systems  Constitutional: Negative for chills and fever.  HENT: Negative for congestion.   Eyes: Negative for visual disturbance.  Respiratory: Positive for chest tightness and shortness of breath.   Cardiovascular: Positive for leg swelling. Negative for chest pain.  Gastrointestinal: Positive for nausea and vomiting. Negative for abdominal pain and diarrhea.  Genitourinary: Negative for dysuria.  Skin: Negative for rash.  Neurological: Negative for headaches.    Physical Exam Updated Vital Signs BP (!) 115/49   Pulse 92   Resp (!) 22   Ht '5\' 2"'$  (1.575 m)   Wt 65.8 kg   SpO2 98%   BMI 26.52 kg/m   Physical Exam Vitals and nursing note reviewed.  Constitutional:      Appearance: Normal appearance.  HENT:     Head: Normocephalic.     Mouth/Throat:     Mouth: Mucous membranes are moist.  Cardiovascular:     Rate and Rhythm: Normal rate.  Pulmonary:     Effort:  Pulmonary effort is normal. No respiratory distress.     Breath sounds: Rales present.  Abdominal:     Palpations: Abdomen is soft.     Tenderness: There is no abdominal tenderness.  Musculoskeletal:     Comments: Trace pedal edema  Skin:    General: Skin is warm.  Neurological:     Mental Status: She is alert and oriented to person, place, and time. Mental status is at baseline.  Psychiatric:        Mood and Affect: Mood normal.     ED Results / Procedures / Treatments   Labs (all labs ordered are listed, but only abnormal results are displayed) Labs Reviewed  CBC WITH DIFFERENTIAL/PLATELET - Abnormal; Notable for the following components:      Result Value   WBC 11.4 (*)    Hemoglobin 11.8 (*)  Platelets 109 (*)    Neutro Abs 9.6 (*)    Lymphs Abs 0.5 (*)    Monocytes Absolute 1.2 (*)    Abs Immature Granulocytes 0.08 (*)    All other components within normal limits  COMPREHENSIVE METABOLIC PANEL - Abnormal; Notable for the following components:   Sodium 134 (*)    Glucose, Bld 150 (*)    BUN 36 (*)    Creatinine, Ser 2.49 (*)    Total Protein 6.4 (*)    Albumin 3.1 (*)    GFR, Estimated 17 (*)    All other components within normal limits  TROPONIN I (HIGH SENSITIVITY) - Abnormal; Notable for the following components:   Troponin I (High Sensitivity) 309 (*)    All other components within normal limits  RESP PANEL BY RT-PCR (FLU A&B, COVID) ARPGX2  LIPASE, BLOOD  PROTIME-INR  BRAIN NATRIURETIC PEPTIDE  TROPONIN I (HIGH SENSITIVITY)    EKG EKG Interpretation  Date/Time:  Monday September 25 2020 20:42:18 EDT Ventricular Rate:  89 PR Interval:  208 QRS Duration: 182 QT Interval:  416 QTC Calculation: 507 R Axis:   -45 Text Interpretation: Sinus or ectopic atrial rhythm Left bundle branch block LBBB is old, appears sinus with prolonged PR Confirmed by Lavenia Atlas [phone]) on 09/25/2020 8:46:14 PM   Radiology DG Chest Port 1 View  Result Date:  09/25/2020 CLINICAL DATA:  Shortness of breath, nausea, vomiting, and chest pain, history CHF EXAM: PORTABLE CHEST 1 VIEW COMPARISON:  Portable exam 2127 hours compared to 09/04/2020 FINDINGS: Enlargement of cardiac silhouette with pulmonary vascular congestion. Atherosclerotic calcification aorta. Mild diffuse interstitial infiltrate greater on RIGHT likely representing pulmonary edema and chronic failure. Bibasilar pleural effusions and atelectasis. No pneumothorax. Bones demineralized. IMPRESSION: Chronic CHF with bibasilar pleural effusions and atelectasis. Aortic Atherosclerosis (ICD10-I70.0). Electronically Signed   By: Lavonia Dana M.D.   On: 09/25/2020 21:41    Procedures Procedures   Medications Ordered in ED Medications  furosemide (LASIX) injection 20 mg (has no administration in time range)    ED Course  I have reviewed the triage vital signs and the nursing notes.  Pertinent labs & imaging results that were available during my care of the patient were reviewed by me and considered in my medical decision making (see chart for details).    MDM Rules/Calculators/A&P                          87 year old female presents the emergency department with reported hypoxia and associated chest pain/shortness of breath, weight gain, swelling of her lower extremities and an episode of vomiting.  Vitals are stable on arrival, she is currently on 2 L nasal cannula, will attempt to wean.  She has rales on bilateral lung auscultation and trace pedal edema, suspicious for CHF exacerbation.  Chest x-ray shows CHF findings with pleural effusion.  Will give IV diuretic.  Blood work shows baseline AKI, first troponin is in her baseline, currently patient is chest pain-free, will repeat the.  Patient is pending diuresis and repeat blood work, evaluation.  Patient signed out to Dr. Laverta [place].  Final Clinical Impression(s) / ED Diagnoses Final diagnoses:  None    Rx / DC Orders ED Discharge Orders     None       Lorelle Gibbs, DO 09/25/20 2338

## 2020-09-26 LAB — TROPONIN I (HIGH SENSITIVITY): Troponin I (High Sensitivity): 318 ng/L (ref <18)

## 2020-09-26 LAB — RESP PANEL BY RT-PCR (FLU A&B, COVID) ARPGX2
Influenza A by PCR: NEGATIVE
Influenza B by PCR: NEGATIVE
SARS Coronavirus 2 by RT PCR: NEGATIVE

## 2020-09-26 NOTE — ED Provider Notes (Signed)
Blood pressure (!) 111/49, pulse 90, resp. rate 20, height '5\' 2"'$  (1.575 m), weight 65.8 kg, SpO2 97 %.  Assuming care from Dr. Dina Rich.  In short, Kristin Adkins is a 87 y.o. female with a chief complaint of Chest Pain, Shortness of Breath, Nausea, and Emesis .  Refer to the original H&P for additional details.  The current plan of care is to f/u on delta troponin and remaining labs. Patient clinically presenting with mild CHF exacerbation. Weaning O2 and prior team has ordered lasix. Will reassess with initial troponin elevated but within range of prior values.    EKG Interpretation  Date/Time:  Monday September 25 2020 20:42:18 EDT Ventricular Rate:  89 PR Interval:  208 QRS Duration: 182 QT Interval:  416 QTC Calculation: 507 R Axis:   -45 Text Interpretation: Sinus or ectopic atrial rhythm Left bundle branch block LBBB is old, appears sinus with prolonged PR Confirmed by Lavenia Atlas [phone]) on 09/25/2020 8:46:14 PM      01:22 AM  Patient's repeat troponin is unchanged essentially. Patient is confused but at baseline. She is awake and alert. No longer requiring O2. No increased WOB. No significant pulmonary edema on CXR. BNP elevated and CKD near baseline in comparison to prior values. Prior BNP values nearly always > 1000. Spoke with the patient's son by phone. We both feel comfortable with the response here to lasix and relatively baseline labs to discharge back to SNF for continued mgmt and return with any new/worsening symptoms.     Margette Fast, MD 09/26/20 [phone]

## 2020-09-26 NOTE — ED Notes (Signed)
Pt son [name] given update that pt would be DC

## 2020-09-26 NOTE — ED Notes (Signed)
This RN spoke to [facility] in order to get pt back. This RN gave report/update to [employer]. Secretary to call [facility] for pt.

## 2020-09-26 NOTE — ED Notes (Signed)
[facility] called 01:56

## 2020-09-26 NOTE — ED Notes (Signed)
Patient verbalizes understanding of discharge instructions. Opportunity for questioning and answers were provided. Armband removed by staff, pt discharged from ED via [place] .

## 2020-09-26 NOTE — Discharge Instructions (Signed)
You were seen in the emergency room today with relatively mild congestive heart failure exacerbation.  Your lab work is similar to your baseline.  We gave an extra dose of IV Lasix and we will have you continue your current dose of all home medications.  Please call your primary care doctor today to schedule a 48-hour follow-up evaluation.  If you develop trouble breathing, increased confusion, fever, other sudden/severe symptoms please return to the emergency department immediately for evaluation.

## 2020-10-05 ENCOUNTER — Other Ambulatory Visit: Payer: Self-pay

## 2020-10-05 ENCOUNTER — Inpatient Hospital Stay (HOSPITAL_COMMUNITY)
Admission: EM | Admit: 2020-10-05 | Discharge: 2020-10-11 | DRG: 291 | Disposition: A | Discharge status: Skilled Nursing Facility | Payer: Medicare Other | Source: Skilled Nursing Facility | Attending: Internal Medicine | Admitting: Internal Medicine

## 2020-10-05 DIAGNOSIS — I480 Paroxysmal atrial fibrillation: Secondary | ICD-10-CM | POA: Diagnosis present

## 2020-10-05 DIAGNOSIS — J449 Chronic obstructive pulmonary disease, unspecified: Secondary | ICD-10-CM | POA: Diagnosis present

## 2020-10-05 DIAGNOSIS — Z905 Acquired absence of kidney: Secondary | ICD-10-CM

## 2020-10-05 DIAGNOSIS — I739 Peripheral vascular disease, unspecified: Secondary | ICD-10-CM | POA: Diagnosis not present

## 2020-10-05 DIAGNOSIS — Z7189 Other specified counseling: Secondary | ICD-10-CM | POA: Diagnosis not present

## 2020-10-05 DIAGNOSIS — N184 Chronic kidney disease, stage 4 (severe): Secondary | ICD-10-CM | POA: Diagnosis present

## 2020-10-05 DIAGNOSIS — Z8616 Personal history of COVID-19: Secondary | ICD-10-CM

## 2020-10-05 DIAGNOSIS — Z7901 Long term (current) use of anticoagulants: Secondary | ICD-10-CM

## 2020-10-05 DIAGNOSIS — I2510 Atherosclerotic heart disease of native coronary artery without angina pectoris: Secondary | ICD-10-CM | POA: Diagnosis present

## 2020-10-05 DIAGNOSIS — F0390 Unspecified dementia without behavioral disturbance: Secondary | ICD-10-CM | POA: Diagnosis present

## 2020-10-05 DIAGNOSIS — I252 Old myocardial infarction: Secondary | ICD-10-CM | POA: Diagnosis not present

## 2020-10-05 DIAGNOSIS — Z9981 Dependence on supplemental oxygen: Secondary | ICD-10-CM

## 2020-10-05 DIAGNOSIS — Z993 Dependence on wheelchair: Secondary | ICD-10-CM | POA: Diagnosis not present

## 2020-10-05 DIAGNOSIS — Z885 Allergy status to narcotic agent status: Secondary | ICD-10-CM | POA: Diagnosis not present

## 2020-10-05 DIAGNOSIS — Z79899 Other long term (current) drug therapy: Secondary | ICD-10-CM

## 2020-10-05 DIAGNOSIS — M25571 Pain in right ankle and joints of right foot: Secondary | ICD-10-CM | POA: Diagnosis not present

## 2020-10-05 DIAGNOSIS — E1151 Type 2 diabetes mellitus with diabetic peripheral angiopathy without gangrene: Secondary | ICD-10-CM | POA: Diagnosis present

## 2020-10-05 DIAGNOSIS — R197 Diarrhea, unspecified: Secondary | ICD-10-CM | POA: Diagnosis not present

## 2020-10-05 DIAGNOSIS — R0602 Shortness of breath: Secondary | ICD-10-CM | POA: Diagnosis present

## 2020-10-05 DIAGNOSIS — I5084 End stage heart failure: Secondary | ICD-10-CM | POA: Diagnosis present

## 2020-10-05 DIAGNOSIS — Z66 Do not resuscitate: Secondary | ICD-10-CM | POA: Diagnosis present

## 2020-10-05 DIAGNOSIS — Z515 Encounter for palliative care: Secondary | ICD-10-CM

## 2020-10-05 DIAGNOSIS — R32 Unspecified urinary incontinence: Secondary | ICD-10-CM | POA: Diagnosis present

## 2020-10-05 DIAGNOSIS — J9601 Acute respiratory failure with hypoxia: Secondary | ICD-10-CM | POA: Diagnosis present

## 2020-10-05 DIAGNOSIS — E785 Hyperlipidemia, unspecified: Secondary | ICD-10-CM | POA: Diagnosis present

## 2020-10-05 DIAGNOSIS — G8929 Other chronic pain: Secondary | ICD-10-CM | POA: Diagnosis not present

## 2020-10-05 DIAGNOSIS — Z833 Family history of diabetes mellitus: Secondary | ICD-10-CM | POA: Diagnosis not present

## 2020-10-05 DIAGNOSIS — I5043 Acute on chronic combined systolic (congestive) and diastolic (congestive) heart failure: Secondary | ICD-10-CM | POA: Diagnosis present

## 2020-10-05 DIAGNOSIS — I130 Hypertensive heart and chronic kidney disease with heart failure and stage 1 through stage 4 chronic kidney disease, or unspecified chronic kidney disease: Secondary | ICD-10-CM | POA: Diagnosis present

## 2020-10-05 DIAGNOSIS — R54 Age-related physical debility: Secondary | ICD-10-CM | POA: Diagnosis present

## 2020-10-05 LAB — SARS CORONAVIRUS 2 (TAT 6-24 HRS): SARS Coronavirus 2: NEGATIVE

## 2020-10-05 MED ORDER — LORAZEPAM 2 MG/ML PO CONC: 1.0000 mg | ORAL | Status: DC | PRN

## 2020-10-05 MED ORDER — LORAZEPAM 2 MG/ML IJ SOLN
1.0000 mg | Freq: Once | INTRAMUSCULAR | Status: AC
  Administered 2020-10-05: 1 mg via INTRAVENOUS
  Filled 2020-10-05: qty 1

## 2020-10-05 MED ORDER — LORAZEPAM 2 MG/ML IJ SOLN: 1.0000 mg | INTRAMUSCULAR | Status: DC | PRN

## 2020-10-05 MED ORDER — FUROSEMIDE 10 MG/ML IJ SOLN
40.0000 mg | Freq: Once | INTRAMUSCULAR | Status: AC
  Administered 2020-10-05: 40 mg via INTRAVENOUS
  Filled 2020-10-05: qty 4

## 2020-10-05 MED ORDER — LORAZEPAM 1 MG PO TABS
1.0000 mg | ORAL_TABLET | ORAL | Status: DC | PRN
  Administered 2020-10-11: 1 mg via ORAL
  Filled 2020-10-05: qty 1

## 2020-10-05 MED ORDER — FUROSEMIDE 10 MG/ML IJ SOLN
20.0000 mg | Freq: Once | INTRAMUSCULAR | Status: AC
  Administered 2020-10-05: 20 mg via INTRAVENOUS
  Filled 2020-10-05: qty 2

## 2020-10-05 NOTE — ED Triage Notes (Signed)
Pt BIB [facility] with complaints of SOB from [facility]. Staff reported the patient woke up this morning in respiratory distress, administered a nebulizer and there was some improvement, but patient still noted to have a lot of rhonchi and fluid in her lungs. EMS had a hard time getting an SpO2 on the patient but continued to administer another duoneb enroute. Otherwise, other VSS at this time. Dr. Dina Rich to bedside to evaluate.

## 2020-10-05 NOTE — ED Notes (Signed)
Patient's son at bedside.

## 2020-10-05 NOTE — Progress Notes (Signed)
Reported to ED to relieve Cristino Martes and continue support to patient and son at bedside. Provided active listening and emotional support.  Will follow as needed.  Jaclynn Major, [place], [facility], Pager [phone]

## 2020-10-05 NOTE — Progress Notes (Signed)
RRT called for bipap trial, MD at bedside. Pt's breaths are irregular, labored and occasionally apneic. Wide QRS noted and poor correlation with pulseox. Pt is pale and cool to touch. Discussion with ED MD to hold off on noninvasive ventilation at this time due to pt's status of DNR. 15L NRB placed. Pt's son at bedside.  Thank you for allowing Respiratory Care to be involved.

## 2020-10-05 NOTE — ED Notes (Signed)
Chaplain called.

## 2020-10-05 NOTE — ED Provider Notes (Signed)
[facility] EMERGENCY DEPARTMENT Provider Note   CSN: [id number] Arrival date & time: 10/05/20  [id number]     History Chief Complaint  Patient presents with  . Shortness of Breath    Kristin Adkins is a 87 y.o. female.  HPI   87 year old female past medical history of HTN, HLD, atrial fibrillation, CAD, CHF, respiratory failure presents the emergency department from nursing home with respiratory distress.  Patient is altered, tachypneic, diffuse rales and upper airway secretions with intermittent hypoxia.  Patient is noted to be DNR.  Son is at bedside, we clarified that she is DNI with out any further aggressive measures.  Patient appears to be having respiratory distress that does not seem to be survivable, recommended we explore options in terms of comfort care and the son has agreed.  Past Medical History:  Diagnosis Date  . AF (atrial fibrillation) ([place])   . CHF (congestive heart failure) ([place])   . CKD (chronic kidney disease), stage IV ([place])   . COPD (chronic obstructive pulmonary disease) ([place])   . Coronary artery disease   . Hypertension   . Myocardial infarction ([place])   . Respiratory failure [facility])     Patient Active Problem List   Diagnosis Date Noted  . NSTEMI (non-ST elevated myocardial infarction) ([place]) 08/15/2020  . Pressure injury of skin 08/12/2020  . SIRS (systemic inflammatory response syndrome) ([place]) 08/11/2020  . Acute kidney injury superimposed on chronic kidney disease ([place]) 08/11/2020  . Acute on chronic combined systolic and diastolic CHF (congestive heart failure) ([place]) 07/30/2020  . DNR (do not resuscitate) 07/30/2020  . Acute on chronic respiratory failure with hypoxia and hypercapnia ([place]) 07/30/2020  . AF (paroxysmal atrial fibrillation) ([place]) 07/30/2020  . Chronic anticoagulation 07/30/2020  . History of COVID-19 07/30/2020  . Type 2 diabetes mellitus without complication ([place]) [id number]  . Prolonged QT interval 07/30/2020  .  Acute lower UTI 07/30/2020  . Respiratory failure [facility])     Past Surgical History:  Procedure Laterality Date  . 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     Family History  Problem Relation Age of Onset  . Diabetes Mellitus II Mother   . Heart failure Father     Social History   Tobacco Use  . Smoking status: Never Smoker  . Smokeless tobacco: Never Used  Substance Use Topics  . Alcohol use: Never    Home Medications Prior to Admission medications   Medication Sig Start Date End Date Taking? Authorizing Provider  acetaminophen (TYLENOL) 500 MG tablet Take 500 mg by mouth every 6 (six) hours as needed for fever or mild pain (discomfort).    [provider]  albuterol (PROVENTIL) (2.5 MG/3ML) 0.083% nebulizer solution Take 3 mLs (2.5 mg total) by nebulization every 6 (six) hours as needed for shortness of breath or wheezing. 08/02/20   Geradine Girt, DO  amiodarone (PACERONE) 100 MG tablet Take 100 mg by mouth daily. 07/19/20 08/01/21  [provider]  apixaban (ELIQUIS) 2.5 MG TABS tablet Take 1 tablet (2.5 mg total) by mouth 2 (two) times daily. 08/02/20   Geradine Girt, DO  famotidine (PEPCID) 10 MG tablet Take 1 tablet (10 mg total) by mouth daily. 08/02/20 08/02/21  Geradine Girt, DO  isosorbide mononitrate (IMDUR) 30 MG 24 hr tablet Take 30 mg by mouth at bedtime.  [provider]  LORazepam (ATIVAN) 0.5 MG tablet Take 0.5 mg by mouth See admin instructions. Every 8 hours prn anxiety for 14 days Patient not taking: Reported on 09/25/2020    [provider]  magnesium oxide (MAG-OX) 400 MG tablet Take 400 mg by mouth daily. 07/20/20 07/20/21  [provider]  melatonin 3 MG TABS tablet Take 6 mg by mouth at bedtime. 07/19/20   [provider]  Multiple Vitamins-Minerals (MULTIVITAMIN WITH MINERALS) tablet Take 1 tablet by  mouth daily.    [provider]  OXYGEN Inhale 2 L into the lungs See admin instructions. Every evening and night shift to maintain O2 sat greater than 90%    [provider]  potassium chloride SA (KLOR-CON) 20 MEQ tablet Take 1 tablet (20 mEq total) by mouth daily. 08/02/20   Geradine Girt, DO  promethazine (PHENERGAN) 25 MG tablet Take 25 mg by mouth See admin instructions. 1 tab every 12 hours as needed for n/v x 1 day    [provider]  spironolactone (ALDACTONE) 25 MG tablet Take 12.5 mg by mouth daily.    [provider]  torsemide (DEMADEX) 20 MG tablet Take 1 tablet (20 mg total) by mouth 2 (two) times daily. 09/05/20 10/05/20  Dessa Phi, DO    Allergies    Codeine, Nitrofurantoin, and Penicillins  Review of Systems   Review of Systems  Unable to perform ROS: Mental status change    Physical Exam Updated Vital Signs BP (!) 125/48   Pulse (!) 48   Temp 98.5 F (36.9 C) (Rectal)   Resp (!) 32   Ht '5\' 2"'$  (1.575 m)   Wt 65.8 kg   SpO2 95%   BMI 26.53 kg/m   Physical Exam Vitals and nursing note reviewed.  Constitutional:      General: She is in acute distress.     Appearance: Normal appearance. She is ill-appearing and diaphoretic.  HENT:     Head: Normocephalic.     Mouth/Throat:     Mouth: Mucous membranes are moist.  Eyes:     Pupils: Pupils are equal, round, and reactive to light.  Cardiovascular:     Rate and Rhythm: Normal rate.  Pulmonary:     Effort: Tachypnea, accessory muscle usage and respiratory distress present.     Breath sounds: Stridor present. Rhonchi and rales present.  Abdominal:     Palpations: Abdomen is soft.  Musculoskeletal:     Right lower leg: Edema present.     Left lower leg: Edema present.  Skin:    General: Skin is warm.  Neurological:     Mental Status: She is alert. Mental status is at baseline. She is disoriented.     ED Results / Procedures / Treatments   Labs (all labs ordered  are listed, but only abnormal results are displayed) Labs Reviewed - No data to display  EKG None  Radiology No results found.  Procedures Procedures   Medications Ordered in ED Medications  furosemide (LASIX) injection 20 mg (20 mg Intravenous Given 10/05/20 0817)  LORazepam (ATIVAN) injection 1 mg (1 mg Intravenous Given 10/05/20 0818)    ED Course  I have reviewed the triage vital signs and the nursing notes.  Pertinent labs & imaging results that were available during my care of the patient were reviewed by me and considered in my medical decision making (see chart for details).    MDM Rules/Calculators/A&P  87 year old female presents emergency department in respiratory distress.  She is DNR/DNI without any other aggressive measures.  I had multiple long discussions with the son, we have decided for comfort care.  Patient was given a dose of Ativan and he is also requesting IV Lasix.  This was given, patient remains on a nonrebreather, shallow tachypneic breaths.  Chaplain was called, I anticipate the patient will naturally pass soon.  Palliative care was involved.  Patient has been evaluated for close to 3 hours, she continues to slowly decline but vitals remain rather stable.  Plan for palliative care consult and admission.  Son and his wife are at bedside.  Final Clinical Impression(s) / ED Diagnoses Final diagnoses:  Shortness of breath    Rx / DC Orders ED Discharge Orders    None       Lorelle Gibbs, DO 10/05/20 1010

## 2020-10-05 NOTE — H&P (Addendum)
Date: 10/05/2020               Patient Name:  Kristin Adkins MRN: [id number]  DOB: 1921/04/24 Age / Sex: 87 y.o., female   PCP: Raelene Bott, MD         Medical Service: Internal Medicine Teaching Service         Attending Physician: Dr. Sid Falcon, MD    First Contact: Dr. Alfonse Spruce Pager: [phone]  Second Contact: Dr. Darrick Meigs Pager: [phone]       After Hours (After 5p/  First Contact Pager: [phone]  weekends / holidays): Second Contact Pager: [phone]   Chief Complaint: respiratory distress   History of Present Illness: Kristin Adkins is a 87 y/o woman with history of HTN, combined systolic and diastolic heart failure, afib, CKD IV, dementia who presents from SNF with acute respiratory distress.  History was obtained from son and daughter-in-law at bedside. This is her 7th visit to the hospital in the last couple of months for the same issue. She was seen in the ED on 4/18 but responded fairly rapidly to IV lasix and was able to return to her nursing facility.  Son reports that she was doing well yesterday, and they had a good visit together.  At baseline, she uses a wheelchair to get around, is on 2L nasal cannula, is incontinent of urine, is able to converse but is confused at baseline, and eats 50-75% of her meals most of the time. Nursing notes she quickly desats if she happens to pull her oxygen off or get up without it.  Staff noted her to be in acute respiratory distress when she woke up this morning.   She is followed by palliative care through [facility] at her nursing facility. During her last hospitalization in March, the palliative care team discussed with her son, [name], her overall disease trajectory and that the frequent admissions were a sign that she could be approaching the end of her life. This was very challenging for him to accept, particularly when he sees her "bounce back" like she always seemed to do. Differences between hospice and palliative care were  discussed, and he was not ready to make the transition to hospice services at that time.   Meds:  Current Meds  Medication Sig  . acetaminophen (TYLENOL) 500 MG tablet Take 500 mg by mouth every 6 (six) hours as needed for fever or mild pain (discomfort).  Marland Kitchen albuterol (PROVENTIL) (2.5 MG/3ML) 0.083% nebulizer solution Take 3 mLs (2.5 mg total) by nebulization every 6 (six) hours as needed for shortness of breath or wheezing.  Marland Kitchen amiodarone (PACERONE) 100 MG tablet Take 100 mg by mouth daily.  Marland Kitchen apixaban (ELIQUIS) 2.5 MG TABS tablet Take 1 tablet (2.5 mg total) by mouth 2 (two) times daily.  . famotidine (PEPCID) 10 MG tablet Take 1 tablet (10 mg total) by mouth daily.  . isosorbide mononitrate (IMDUR) 60 MG 24 hr tablet Take 60 mg by mouth daily.  . magnesium oxide (MAG-OX) 400 MG tablet Take 400 mg by mouth daily.  . melatonin 3 MG TABS tablet Take 6 mg by mouth at bedtime.  . Multiple Vitamins-Minerals (MULTIVITAMIN WITH MINERALS) tablet Take 1 tablet by mouth daily.  . nitroGLYCERIN (NITROSTAT) 0.4 MG SL tablet Place 0.4 mg under the tongue every 5 (five) minutes as needed for chest pain.  Marland Kitchen ondansetron (ZOFRAN) 4 MG tablet Take 4 mg by mouth every 8 (eight) hours. Taking at 0600, 1400, and 2200  for 5 days starting 10/01/20.  Marland Kitchen OXYGEN Inhale 2 L into the lungs See admin instructions. Every evening and night shift to maintain O2 sat greater than 90%  . potassium chloride SA (KLOR-CON) 20 MEQ tablet Take 1 tablet (20 mEq total) by mouth daily.  Marland Kitchen spironolactone (ALDACTONE) 25 MG tablet Take 12.5 mg by mouth daily.  Marland Kitchen torsemide (DEMADEX) 20 MG tablet Take 1 tablet (20 mg total) by mouth 2 (two) times daily.     Allergies: Allergies as of 10/05/2020 - Review Complete 10/05/2020  Allergen Reaction Noted  . Codeine Other (See Comments) 09/29/2012  . Nitrofurantoin Other (See Comments) 09/29/2012  . Penicillins Rash 09/29/2012   Past Medical History:  Diagnosis Date  . AF (atrial  fibrillation) ([place])   . CHF (congestive heart failure) ([place])   . CKD (chronic kidney disease), stage IV ([place])   . COPD (chronic obstructive pulmonary disease) ([place])   . Coronary artery disease   . Hypertension   . Myocardial infarction ([place])   . Respiratory failure ([place])     Family History:  Family History  Problem Relation Age of Onset  . Diabetes Mellitus II Mother   . Heart failure Father      Social History: resident of [place]. Wheelchair bound. No tobacco, EtOH or illicit drug use.   Review of Systems: A complete ROS was negative except as per HPI.   Physical Exam: Blood pressure (!) 121/48, pulse 78, temperature 98.5 F (36.9 C), temperature source Rectal, resp. rate (!) 26, height '5\' 2"'$  (1.575 m), weight 65.8 kg, SpO2 99 %. General: somnolent, ill-appearing, in mild respiratory distress, intermittently tugging at non-rebreather  HEENT: [place]/AT; mild conjunctival injection bilaterally CV: distant heart sounds; irregular rhythm  Pulm: increased work of breathing, coarse breath sounds with diffuse crackles Abd: non-distended, soft  Ext: warm, trace edema bilaterally Neuro: somnolent, disoriented, intermittently pulls at mask. Non-focal  Skin: warm and dry    Assessment & Plan by Problem: Active Problems:   Acute respiratory failure with hypoxia [facility])  Kristin Adkins is a 87 y/o woman with history of HTN, combined systolic and diastolic heart failure, afib, CKD IV, dementia who presents from SNF with acute respiratory distress.   Acute Hypoxic respiratory failure  End-stage combined heart failure -patient presented from SNF in acute respiratory distress. It initially appeared that she was actively dying, but has stabilized on 68 L NRB  -I had another extensive conversation with the son about her disease course and that she may not survive this hospitalization.  -he was agreeable to a trial of basic intervention with IV lasix and supplemental oxygen support. NO escalation of  care, including BiPAP or pressors. If she does not show improvement over the next 24 hours or worsens despite current treatment, will transition to full comfort care.  -she has not had any urine output with 20 mg of IV lasix thus far; her blood pressure is stable so will give another dose of 40 mg IV. Monitor urine output and blood pressure closely. Will dose again this afternoon if able.  -try to wean NRB to nasal cannula as able -will use low-dose ativan prn for agitation or signs of distress  -case discussed with palliative care who is familiar with her and her family from previous admission. Greatly appreciate their assistance and any additional symptom management recommendations  -holding home PO meds: imdur, spironolactone and torsemide    PAF -on amiodarone, eliquis -holding at this time, as she is not awake enough to  take PO   CKD IV -son does not want lab draws despite potential for electrolyte abnormalities that may occur with diuresis  -tracking response to diuresis based on urine output    DVT ppx: SCDs Diet: comfort feeds  CODE: DNR  Dispo: Admit patient to Inpatient with expected length of stay greater than 2 midnights.  SignedDelice Bison, DO 10/05/2020, 11:29 AM  Pager: [phone] After 5pm on weekdays and 1pm on weekends: On Call pager: [phone]

## 2020-10-05 NOTE — ED Notes (Signed)
Floor reports room is still dirty.

## 2020-10-05 NOTE — ED Notes (Signed)
Family provided water, pt asleep breathing comfortably with 3 liters [place], spontaneous  symmetrical chest rise. NADN.

## 2020-10-05 NOTE — Progress Notes (Signed)
   10/05/20 0748  Clinical Encounter Type  Visited With Patient and family together  Visit Type Patient actively dying  Referral From Nurse  Consult/Referral To Chaplain  Spiritual Encounters  Spiritual Needs Emotional;Grief support   Chaplain responded to page for Pt actively dying. Pt's son, [name], was at bedside. Pt was present while Pt's nurse and doctor discussed comfort care and future options. Chaplain engaged active listening and provided emotional and spiritual support. This chaplain was relieved by Jobe Gibbon.  This note was prepared by Chaplain Resident, Dante Gang, MDiv. Chaplain remains available as needed through the on-call pager: [phone].

## 2020-10-05 NOTE — ED Notes (Signed)
This rn called floor, secretary reports room is still being cleaned.

## 2020-10-05 NOTE — Consult Note (Signed)
Consultation Note Date: 10/05/2020   Patient Name: Kristin Adkins  DOB: 03-25-1921  MRN: [id number]  Age / Sex: 87 y.o., female  PCP: Raelene Bott, MD Referring Physician: Sid Falcon, MD  Reason for Consultation: Establishing goals of care "end of life"  HPI/Patient Profile: 87 y.o. female  with past medical history of CHF, CKD stage IV with solitary kidney,dementia,  COPD, atrial fibrillation on anticoagulation, CAD, MI, and HTN. Presented to the emergency department on 10/05/2020 with acute respiratory distress.  ED Course: Patient with AMS, tachypnea, and intermittent hypoxia. Patient was given a dose of Ativan and IV lasix. Admitted to IMTS with acute hypoxic respiratory failure and end-stage combined heart failure.   In 2022, patient has had 5 inpatient admissions for similar issues (3 at [facility] and 2 at [facility]).   Clinical Assessment and Goals of Care: I have reviewed medical records including EPIC notes, labs and imaging, and examined the patient. She is unresponsive at the time of my assessment. On 4L nasal cannula. Noted purewick canister with 45m urine output.    I met at bedside with son/Dennis and daughter-in-law BHorris Latino to discuss diagnosis, prognosis, GOC, EOL wishes, disposition, and options.  Patient is known to PMT from her previous admission. PMT discussed extensively with DSimona Huhthe trajectory of end-stage heart failure and concern that frequent admissions suggest she is approaching end of life. This was very challenging for him to accept, especially when he sees her "bounce back" after each admission. Also extensively discussed the differences between palliative care and hospice - DSimona Huhwas not ready for hospice at that time but did agree to outpatient patient services.  Patient was seen by outpatient palliative with [facility] on 09/20/18.   As far as functional status at  baseline, patient is wheelchair bound, incontinent, and confused. She has continued to eat 50-70% of her meals. She uses 2L oxygen continuously, and quickly desats without it.    We discussed her current medical condition in the larger context of her ongoing co-morbidities.  Natural  trajectory at EOL was discussed. Expressed concern regarding regarding no urine output. Gently stated that Mrs BNorbeckappeared to be approaching end of life.   The difference between full scope medical intervention and comfort care was considered. For now, DSimona Huhwants to continue oxygen, cardiac monitoring, IV lasix, and Ativan. He does not want lab work or additional diagnostic testing. DSimona Huhinquires about BiPAP, and inquires if this could be an option since it is non-invasive. Discussed that unfortunately she would not be a candidate for BiPAP at this point due to her mental status as she is minimally responsive and currently unable to protect her airway.   I provided reassurance that current plan of care is certainly reasonable for now. Discussed that patient currently seemed comfortable, and that none of the current medical interventions should be causing discomfort. Discussed option to transition to full comfort at some point if she continues to decline. Provided education that IV lasix can be utilized as a comfort measure in  some cases. Discussed use of an opioid to help with pain and/or dyspnea. [name] shares that Ms Pizana has an allergy to codeine - he does not know what the reaction was. Discussed use of fentanyl if needed, which is safe to use in a patient with a codeine allergy.   Questions and concerns were addressed. PMT contact info was provided and family was encouraged to call with questions or concerns.    Primary decision maker: Son Yumiko Alkins    SUMMARY OF RECOMMENDATIONS    DNR/DNI as previously documented  Continue current medical care (oxygen, IV lasix, Ativan, cardiac monitoring)  No  escalation of care  Patient would not be a candidate for BiPAP with current mental status  If transition to full comfort, would recommend Fentanyl as patient has an allergy to codeine  PMT will continue to follow   Code Status/Advance Care Planning:  DNR  Symptom Management:   Lorazepam (ATIVAN) 1 mg every 4 hours PRN anxiety  Palliative Prophylaxis:   Oral Care and Turn Reposition  Additional Recommendations (Limitations, Scope, Preferences):  No Lab Draws  Psycho-social/Spiritual:   Created space and opportunity for family to express thoughts and feelings regarding patient's current medical situation.   Emotional support provided   Prognosis:   Very poor  Discharge Planning: To Be Determined      Primary Diagnoses: Present on Admission: . Acute respiratory failure with hypoxia ([place])   I have reviewed the medical record, interviewed the patient and family, and examined the patient. The following aspects are pertinent.  Past Medical History:  Diagnosis Date  . AF (atrial fibrillation) ([place])   . CHF (congestive heart failure) ([place])   . CKD (chronic kidney disease), stage IV ([place])   . COPD (chronic obstructive pulmonary disease) ([place])   . Coronary artery disease   . Hypertension   . Myocardial infarction ([place])   . Respiratory failure ([facility])     Family History  Problem Relation Age of Onset  . Diabetes Mellitus II Mother   . Heart failure Father    Scheduled Meds: Continuous Infusions: PRN Meds:.LORazepam **OR** LORazepam **OR** LORazepam Medications Prior to Admission:  Prior to Admission medications   Medication Sig Start Date End Date Taking? Authorizing Provider  acetaminophen (TYLENOL) 500 MG tablet Take 500 mg by mouth every 6 (six) hours as needed for fever or mild pain (discomfort).   Yes [provider]  albuterol (PROVENTIL) (2.5 MG/3ML) 0.083% nebulizer solution Take 3 mLs (2.5 mg total) by nebulization every 6 (six) hours as  needed for shortness of breath or wheezing. 08/02/20  Yes Geradine Girt, DO  amiodarone (PACERONE) 100 MG tablet Take 100 mg by mouth daily. 07/19/20 08/01/21 Yes [provider]  apixaban (ELIQUIS) 2.5 MG TABS tablet Take 1 tablet (2.5 mg total) by mouth 2 (two) times daily. 08/02/20  Yes Eulogio Bear U, DO  famotidine (PEPCID) 10 MG tablet Take 1 tablet (10 mg total) by mouth daily. 08/02/20 08/02/21 Yes Vann, Jessica U, DO  isosorbide mononitrate (IMDUR) 60 MG 24 hr tablet Take 60 mg by mouth daily. 10/01/20  Yes [provider]  magnesium oxide (MAG-OX) 400 MG tablet Take 400 mg by mouth daily. 07/20/20 07/20/21 Yes [provider]  melatonin 3 MG TABS tablet Take 6 mg by mouth at bedtime. 07/19/20  Yes [provider]  Multiple Vitamins-Minerals (MULTIVITAMIN WITH MINERALS) tablet Take 1 tablet by mouth daily.   Yes [provider]  nitroGLYCERIN (NITROSTAT) 0.4 MG SL tablet  Place 0.4 mg under the tongue every 5 (five) minutes as needed for chest pain. 10/01/20  Yes [provider]  ondansetron (ZOFRAN) 4 MG tablet Take 4 mg by mouth every 8 (eight) hours. Taking at 0600, 1400, and 2200 for 5 days starting 10/01/20. 10/01/20  Yes [provider]  OXYGEN Inhale 2 L into the lungs See admin instructions. Every evening and night shift to maintain O2 sat greater than 90%   Yes [provider]  potassium chloride SA (KLOR-CON) 20 MEQ tablet Take 1 tablet (20 mEq total) by mouth daily. 08/02/20  Yes Geradine Girt, DO  spironolactone (ALDACTONE) 25 MG tablet Take 12.5 mg by mouth daily.   Yes [provider]  torsemide (DEMADEX) 20 MG tablet Take 1 tablet (20 mg total) by mouth 2 (two) times daily. 09/05/20 10/05/20 Yes Dessa Phi, DO   Allergies  Allergen Reactions  . Codeine Other (See Comments)    UNKNOWN  . Nitrofurantoin Other (See Comments)    UNKNOWN  . Penicillins Rash   Review of Systems  Unable to perform ROS:  Patient unresponsive    Physical Exam Vitals reviewed.  Constitutional:      General: She is not in acute distress.    Appearance: She is ill-appearing.  Cardiovascular:     Rate and Rhythm: Normal rate.  Pulmonary:     Effort: Pulmonary effort is normal. Tachypnea present.  Neurological:     Mental Status: She is unresponsive.     Vital Signs: BP (!) 121/48   Pulse 78   Temp 98.5 F (36.9 C) (Rectal)   Resp (!) 26   Ht 5' 2"  (1.575 m)   Wt 65.8 kg   SpO2 100%   BMI 26.53 kg/m  Pain Scale: Faces      SpO2: SpO2: 100 % O2 Device:SpO2: 100 % O2 Flow Rate: .O2 Flow Rate (L/min): 4 L/min  IO: Intake/output summary: No intake or output data in the 24 hours ending 10/05/20 1214  LBM:   Baseline Weight: Weight: 65.8 kg Most recent weight: Weight: 65.8 kg      Palliative Assessment/Data: PPS 10%     Time In: 12:00 Time Out: 13:10 Time Total: 70 minutes Greater than 50%  of this time was spent counseling and coordinating care related to the above assessment and plan.  Signed by: Lavena Bullion, NP   Please contact Palliative Medicine Team phone at [phone] for questions and concerns.  For individual provider: See Shea Evans

## 2020-10-06 DIAGNOSIS — R0602 Shortness of breath: Secondary | ICD-10-CM

## 2020-10-06 DIAGNOSIS — F0390 Unspecified dementia without behavioral disturbance: Secondary | ICD-10-CM

## 2020-10-06 MED ORDER — FENTANYL CITRATE (PF) 100 MCG/2ML IJ SOLN
12.5000 ug | INTRAMUSCULAR | Status: DC | PRN
  Administered 2020-10-06: 12.5 ug via INTRAVENOUS
  Filled 2020-10-06: qty 2

## 2020-10-06 NOTE — Plan of Care (Signed)

## 2020-10-06 NOTE — Progress Notes (Signed)
Daily Progress Note   Patient Name: Kristin Adkins       Date: 10/06/2020 DOB: 12-20-1920  Age: 87 y.o. MRN#: [id number] Attending Physician: Sid Falcon, MD Primary Care Physician: Raelene Bott, MD Admit Date: 10/05/2020  Reason for Consultation/Follow-up: continued [place] discussions  Subjective: 17:00--Patient with improved mentation today. She is oriented to self and able to answer some simple questions. Denies pain or shortness of breath at present time. Note that oxygen is currently off - spo2 is 88-92% on room air.  18:48--Spoke with son Kristin Adkins by phone. Briefly discuss that patient has improved overall since yesterday. We plan to meet tomorrow in person.   Length of Stay: 1  Current Medications: Scheduled Meds:       PRN Meds: fentaNYL (SUBLIMAZE) injection, LORazepam **OR** LORazepam **OR** LORazepam  Physical Exam Vitals reviewed.  Constitutional:      General: She is not in acute distress.    Appearance: She is ill-appearing.     Comments: Frail  Cardiovascular:     Rate and Rhythm: Normal rate.  Pulmonary:     Effort: Pulmonary effort is normal.  Neurological:     Motor: Weakness present.  Psychiatric:        Cognition and Memory: Cognition is impaired.             Vital Signs: BP (!) 112/43   Pulse 81   Temp 98.7 F (37.1 C) (Oral)   Resp 17   Ht '5\' 2"'$  (1.575 m)   Wt 65.6 kg   SpO2 97%   BMI 26.45 kg/m  SpO2: SpO2: 97 % O2 Device: O2 Device: Nasal Cannula  Intake/output summary: No intake or output data in the 24 hours ending 10/06/20 1838 LBM: Last BM Date: 10/05/20 Baseline Weight: Weight: 65.8 kg Most recent weight: Weight: 65.6 kg       Palliative Assessment/Data: PPS 20%         Palliative Care Assessment & Plan   HPI/Patient  Profile: 87 y.o. female  with past medical history of CHF, CKD stage IV with solitary kidney,dementia,  COPD, atrial fibrillation on anticoagulation, CAD, MI, and HTN. Presented to the emergency department on 10/05/2020 with acute respiratory distress.  ED Course: Patient with AMS, tachypnea, and intermittent hypoxia. Patient was given a dose of Ativan and IV lasix. Admitted to  IMTS with acute hypoxic respiratory failure and end-stage combined heart failure.    In 2022, patient has had 5 inpatient admissions for similar issues (3 at [facility] and 2 at [facility]).   Assessment: - acute on chronic end stage combined heart failure - acute respiratory failure secondary to above - dementia - atrial fibrillation - CKD stage 4 with solitary kidney  Recommendations/Plan: DNR/DNI as previously documented Continue current medical care (oxygen, IV lasix, Ativan, cardiac monitoring) Continue Lorazepam (Ativan) 1 mg every 4 hours PRN anxiety Continue fentanyl 12.5 mcg IV every 1 hours PRN pain No BiPAP, pressors, lab draws PMT will continue to follow  Goals of Care and Additional Recommendations: Limitations on Scope of Treatment: No Lab Draws  Code Status: DNR/DNI  Prognosis:  very poor  Discharge Planning: [place] for rehab with Palliative care service follow-up    Thank you for allowing the Palliative Medicine Team to assist in the care of this patient.   Total Time 15 minutes Prolonged Time Billed  no       Greater than 50%  of this time was spent counseling and coordinating care related to the above assessment and plan.  Lavena Bullion, NP  Please contact Palliative Medicine Team phone at [phone] for questions and concerns.

## 2020-10-06 NOTE — Progress Notes (Signed)
Patient ID: Kristin Adkins, female   DOB: July 15, 1920, 87 y.o.   MRN: [id number]   Subjective: HD#1  Over night patient had 2 large, and 1 medium watery and mucous filled bowel movements that concerned the nurse for C. Diff based on smell and appearance. Patient does have history of numerous hospital admissions over the past few months, lives in a SNF, and was treated with ciprofloxacin during the month of February and Doxycycline in March prior to her last admission.  Patient reports feeling like she can breathe better this morning. But reports abdominal and foot pain. She asked to be left alone after palpation of her feet and would not allow me this remove her socks to see her feet. Overall, improved since admission and was able to converse which she was unable to do yesterday.   Objective:  Vital signs in last 24 hours: Vitals:   10/05/20 1645 10/05/20 2015 10/05/20 2147 10/06/20 0523  BP: (!) 114/39 (!) 106/39 (!) 104/43 (!) 104/45  Pulse: 80 79 75 82  Resp: (!) 47 (!) '23 20 20  '$ Temp:   98.6 F (37 C) 98.5 F (36.9 C)  TempSrc:   Oral Oral  SpO2: 98% 98% 100% 98%  Weight:   65.6 kg   Height:       Weight change:  No intake or output data in the 24 hours ending 10/06/20 [id number]  Physical Exam Constitutional:      Appearance: She is underweight. She is ill-appearing.  Cardiovascular:     Rate and Rhythm: Normal rate and regular rhythm.     Heart sounds: Normal heart sounds.  Pulmonary:     Effort: Tachypnea present.     Breath sounds: Examination of the right-upper field reveals decreased breath sounds. Examination of the left-upper field reveals decreased breath sounds. Decreased breath sounds present.  Abdominal:     General: Bowel sounds are normal.     Palpations: Abdomen is soft.     Tenderness: There is abdominal tenderness in the right lower quadrant and left lower quadrant.  Musculoskeletal:     Right lower leg: No edema.     Left lower leg: No edema.     Right foot:  Tenderness present.     Left foot: Tenderness present.  Skin:    General: Skin is warm and dry.  Neurological:     Mental Status: She is disoriented.  Psychiatric:        Behavior: Behavior is agitated.     Assessment/Plan:  Active Problems:   Acute respiratory failure with hypoxia (HCC)  Kristin Adkins is a 87 year old female with a history of HTN, combined systolic and diastolic heart failure, a fib, CKD IV, and dementia who presented to the ED from her SNF due to acute respiratory distress admitted for acute respiratory distress secondary to end stage heart failure.   Acute respiratory failure secondary to end stage combined heart failure Patient was last hospitalized just over a month ago for the same problem, and seen in the ED just over a week ago for this problem as well. Patient is followed by [facility] palliative care as outpatient. Family is not ready to transition to full hospice care at this time, but if patient decompensates will transition to complete comfort care. Appears to be euvolemic on exam.  Today patient is improved mentation and symptom wise.   - ativan as needed for irritation or distress - patient has received a total of 100 mg of lasix in last  24 hours with output of approximately 600-700 ml overnight. Appears euvolemic on exam, and so will not order additional lasix at this time.  - NO escalation of care including BiPAP and pressors  - NO routine labs for monitoring  - continue to hold home iumdur, spironolactone, torsemide, amiodarone, eliquis  Diarrhea Over night patient had 2 large, and 1 medium watery and mucous filled bowel movements that concerned the nurse for C. Diff based on smell and appearance. Patient does have history of numerous hospital admissions over the past few months, lives in a SNF, and was treated with ciprofloxacin during the month of February and Doxycycline in March prior to her last admission.  She is high risk for C diff given this  history. On exam today she was very tender in LLQ and RLQ and had another large bowel movement with foul smell while I was in the room.   - highly probable to be C diff. Will get C diff screening and place on enteric precautions - will contact son to discuss treatment with son if testing comes back positive  - fentanyl for pain control   DVT ppx: SCDs Diet: comfort feeds  CODE: DNR Dispo: pending C diff results and family discussion     LOS: 1 day   Mikael Spray, Medical Student 10/06/2020, 7:28 AM

## 2020-10-06 NOTE — Hospital Course (Addendum)
Heart Failure Exacerbation Kristin Adkins is a 87 year old female with a history of HTN, combined systolic and diastolic heart failure, a fib, CKD IV, and dementia who presented to the ED on 10/05/20 due to acute respiratory failure. This is her 7th hospitalization in the past few months for the same problem and was last seen in the ED on 09/25/2020 for this issue and responded well to lasix at that time.   At baseline, she uses a wheelchair to get around, is on 2L nasal cannula, is incontinent of urine, is able to converse but is confused at baseline, and eats 50-75% of her meals most of the time. Nursing notes she quickly desats if she happens to pull her oxygen off or get up without it.  Staff noted her to be in acute respiratory distress when she woke up this morning.   In the ED patient initially appeared to be actively dying, but stabilized on 16 L NRB. After conversations with the son he was agreeable to IV lasix and supplemental oxygen therapy. He requested no escalation of care including no BiPAP or pressors. He also requested no lab draws and understood the risk of electrolyte disturbances with lasix treatment.   Patient was able to be weaned to 3L [place] after a few hours on NRB. Overnight, patient was able to be weaned to 2.5L [place] and put out approximately 600-700 mL or urine. On exam. 10/06/2020 patient appeared euvolemic and no further lasix was ordered. (total lasix = 100 mg).   Diarrhea After admission patient had 3 large watery mucus filled foul smelling bowel movements. On exam 10/06/2020 patient had significant tenderness in lower abdomen with LLQ worse than RLQ. Patient was then tested for C difficile, which was negative.  She had no further loose bowel movements and abdominal tenderness had resolved by the afternoon of 04/29.    Morning 4/30: Notes feeling alright. Does have some leg pain from lying in bed. Denies any trouble breathing.

## 2020-10-06 NOTE — Progress Notes (Signed)
  Date: 10/06/2020  Patient name: Kristin Adkins  Medical record number: [id number]  Date of birth: 11-15-1920   I have seen and evaluated Kristin Adkins and discussed their care with the Residency Team. Briefly, Kristin Adkins is a 87 year old woman with PMH of HTN, combined heart failure, Aifb, CKD 4, dementia who presented with respiratory distress.  This is her 7th admission for the past few months for the same issue.  She has responded well to IV lasix and been abl to be discharged home.  She is needing more oxygen.  She has also had some diarrhea.  She is followed by palliative care at her nursing facility.  We had a long discussion this morning about end of life care, particularly about feeding.  Her son is very concerned about her lack of interest in food and we discussed how this might be a sign that she is near the end of life.  He expressed understanding, but also would like to think about IV nutrition.  I also explained to him that IV nutrition would cause more harm than good (risk of infection, volume overload, etc). Today she was noted to be crying in pain, particularly in the right leg.   Vitals:   10/06/20 1600 10/06/20 1900  BP: (!) 111/49 (!) 123/92  Pulse: 84 90  Resp: 15 18  Temp:    SpO2: 93% 99%   Gen: Elderly woman, lying in bed, confused CV: RR, NR, no murmur noted, no LE edema Pulm: Breathing comfortably on 2LNC, no wheezing Abd: Soft, NT, ND, +BS MSK: She has cool extremity on the right, pain to palpation on the right.  She has a large bruise on the lateral right calf Psych: Crying in pain, unable to lay still.   Assessment and Plan: I have seen and evaluated the patient as outlined above. I agree with the formulated Assessment and Plan as detailed in the residents' note, with the following changes:   Kristin Adkins has end stage heart failure, CKD 4 and hypoxic respiratory failure and volume overload.  She has responded to IV diuresis.  Her family has requested no blood work,  so not able to follow course of her renal function at this time.  She had diarrhea on admission which has slowed down and she only had one reported BM today.  We will treat her pain with fentanyl IV at low doses.  I believe she may have PAD, however, would not benefit from further workup.  Pain control is warranted.   Sid Falcon, MD 4/29/20227:13 PM

## 2020-10-06 NOTE — Progress Notes (Signed)
MD paged to make aware that pt O2 sats decreased into the 70's-placed on 3L. Noted qtc read high. Verified with tele.  QTC .53 per tele.

## 2020-10-07 DIAGNOSIS — I739 Peripheral vascular disease, unspecified: Secondary | ICD-10-CM

## 2020-10-07 DIAGNOSIS — R197 Diarrhea, unspecified: Secondary | ICD-10-CM

## 2020-10-07 LAB — C DIFFICILE QUICK SCREEN W PCR REFLEX
C Diff antigen: NEGATIVE
C Diff interpretation: NOT DETECTED
C Diff toxin: NEGATIVE

## 2020-10-07 NOTE — Plan of Care (Signed)

## 2020-10-07 NOTE — Progress Notes (Addendum)
Patient ID: Kristin Adkins, female   DOB: [birth year], 87 y.o.   MRN: [id number]   Subjective: HD#2  Overnight: Patient's O2 sats dropped to 70s and was placed back on 2L of oxygen [place]. When evaluated at that time mild edema was noted in her legs bilaterally.   This morning she reports no abdominal pain, difficulty breathing, chest pain. She asks for water and if it time to get up yet. Over all, patient is more conversant and aware today than yesterday.  She still endorses pain in her R leg.   Spoke at length with patient's son about goals of care and the family does not wish to transition to hospice. Family is amenable to non-invasive interventions, but does not wish to escalate care.   The son feels that the patient is still sleeping more than normal for her. He also reports that at baseline she only needs oxygen at night not during the day and wishes her to stay for further monitoring as we try to wean oxygen.   Objective:  Vital signs in last 24 hours: Vitals:   10/06/20 1600 10/06/20 1900 10/06/20 2100 10/07/20 0500  BP: (!) 111/49 (!) 123/92    Pulse: 84 90    Resp: 15 18    Temp:   (!) 97.5 F (36.4 C) 98.8 F (37.1 C)  TempSrc:   Oral Oral  SpO2: 93% 99%    Weight:      Height:       Weight change:   Intake/Output Summary (Last 24 hours) at 10/07/2020 0650 Last data filed at 10/07/2020 0109 Gross per 24 hour  Intake --  Output 350 ml  Net -350 ml   Physical Exam Constitutional:      General: She is not in acute distress. Neck:     Vascular: No hepatojugular reflux or JVD.  Cardiovascular:     Rate and Rhythm: Normal rate and regular rhythm.     Pulses:          Dorsalis pedis pulses are 1+ on the right side and 2+ on the left side.     Heart sounds: Normal heart sounds.  Pulmonary:     Effort: No tachypnea.     Breath sounds: Normal breath sounds. No rales.  Abdominal:     General: Bowel sounds are normal.     Palpations: Abdomen is soft.     Tenderness:  There is no abdominal tenderness.  Musculoskeletal:     Right lower leg: Tenderness present. No edema.     Left lower leg: No edema.  Skin:    General: Skin is warm and dry.     Comments: Skin of R lower extremity cooler than the L     C diff negative  Assessment/Plan:  Active Problems:   Acute respiratory failure with hypoxia (HCC)   Shortness of breath  Kristin Adkins is a 87 year old female with a history of HTN, combined systolic and diastolic heart failure, a fib, CKD IV, and dementia who presented to the ED from her SNF due to acute respiratory distress admitted for acute respiratory distress secondary to end stage heart failure.    Acute respiratory failure secondary to end stage combined heart failure Patient was last hospitalized just over a month ago for the same problem, and seen in the ED just over a week ago for this problem as well. Patient is followed by [facility] palliative care as outpatient. Appears to be euvolemic on exam.  Today patient continues  to improve and was more alert and conversational.   - Family is not ready to transition to full hospice care at this time, but if patient decompensates plans to transition to complete comfort care.  - If patient does not continue to improve over next day or two will have another goals of care conversation with the son - ativan as needed for irritation or distress - Intermittent lasix therapy. Total output approximately 1L (600 mL unmeasured due to clogged catheter). Appears euvolemic on exam today, and so will not order additional lasix at this time.  - will continue to wean oxygen with goal of patient only needing oxygen 2L at night or when sleeping. - Oxygen was stopped this morning around 1030AM with sats staying 97-99% over next 30 min. Will continue to monitor and provide supplemental oxygen as needed.  - NO escalation of care including BiPAP and pressors  - NO routine labs for monitoring  - continue to hold home  iumdur, spironolactone, torsemide, amiodarone, eliquis   Diarrhea Over the course of yesterday she did not have any further large watery bowel movements and her abdominal pain resolved. Therefore we decided to no longer proceed with C diff testing, however testing was completed regardless and was negative. - No further work up   PAD Pain in R lower leg. R leg cooler than the left, with decreased doris pedis pulse on R compared to L. Given history and symptoms likely PAD. Patient would not benefit from further work up considering she is on palliative care from end stage combined heart failure.  - fentanyl PRN for pain    DVT ppx: SCDs Diet: comfort feeds  CODE: DNR Dispo: likely 1-2 days    LOS: 2 days   Mikael Spray, Medical Student 10/07/2020, 6:50 AM

## 2020-10-07 NOTE — Progress Notes (Addendum)
Daily Progress Note   Patient Name: Kristin Adkins       Date: 10/07/2020 DOB: 04-Mar-1921  Age: 87 y.o. MRN#: [id number] Attending Physician: Axel Filler, * Primary Care Physician: Raelene Bott, MD Admit Date: 10/05/2020  Reason for Consultation/Follow-up: goals of care  Subjective: I met with [name] in the 2W waiting room. He expressed relief that his mother has improved, but does not think she is well enough to return to [facility].  We discussed her baseline status - she can roll herself around in the wheelchair, feed herself, and wears oxygen only at night. He understands that she will likely not return to exactly where she was before, but he wants her be closer to baseline that she is currently.   [name] expresses concern that she is not eating/drinking much. He shares that the medical team explained why IV nutrition is not recommended and he verbalizes understanding. He states he would not want her to have a feeding tube, and I confirm that artificial feeding is not recommended for patients with dementia. Discussed briefly that not eating can be a sign of EOL as the body is going through the process of "shutting down".   We discuss again that Mrs. Lozano is in end-stage heart failure, and will continue to have frequent exacerbations. Discussed that the time will come when she will not "bounce back". [name] acknowledges this fact. He shares that he is "holding on" and is not ready to lose his mother.   [name] has expressed that he doesn't want his mother to suffer and doesn't want to pursue invasive or aggressive medical interventions. For now, he wants to continue to "treat the treatable". This would include treating an infection with an antibiotic if needed, titrating the oxygen as  needed, and utilizing IV lasix if needed. He debates over the use of BiPAP, stating it is not invasive. I counter that although it is not technically invasive, it is uncomfortable and I would not recommend using BiPAP for Mrs. Lavone Neri.   [name] also wants her transferred to the hospital if needed. He states that he is not ready for hospice, and I agreed that his current goals for his mother do not fully align with the philosophy of hospice.   Length of Stay: 2  Current Medications: Scheduled Meds:  PRN Meds: fentaNYL (SUBLIMAZE) injection, LORazepam **OR** LORazepam **OR** LORazepam    Vital Signs: BP 112/66 (BP Location: Right Arm)   Pulse 87   Temp 98.6 F (37 C) (Axillary)   Resp 16   Ht _0  (1.575 m)   Wt 65.6 kg   SpO2 98%   BMI 26.45 kg/m  SpO2: SpO2: 98 % O2 Device: O2 Device: Nasal Cannula O2 Flow Rate: O2 Flow Rate (L/min): 2 L/min  Intake/output summary:   Intake/Output Summary (Last 24 hours) at 10/07/2020 1946 Last data filed at 10/07/2020 0109 Gross per 24 hour  Intake --  Output 350 ml  Net -350 ml   LBM: Last BM Date: 10/06/20 Baseline Weight: Weight: 65.8 kg Most recent weight: Weight: 65.6 kg       Palliative Assessment/Data: PPS 20-30%      Palliative Care Assessment & Plan   HPI/Patient Profile:87 y.o.femalewith past medical history of CHF, CKD stage IV with solitary kidney,dementia, COPD, atrial fibrillation on anticoagulation, CAD, MI, and HTN. Presented to the emergency department on4/28/2022with acute respiratory distress. ED Course:Patient with AMS, tachypnea, and intermittent hypoxia. Patient was given a dose of Ativan and IV lasix. Admitted to IMTS with acute hypoxic respiratory failure and end-stage combined heart failure.  In 2022,patient has had 5 inpatient admissions for similar issues (3 at [facility] and 2 at [facility]).  Assessment: - acute on chronic end stage combined heart failure - acute respiratory  failure secondary to above - dementia - atrial fibrillation - CKD stage 4 with solitary kidney  Recommendations/Plan: DNR/DNI as previously documented Continue current medical care (oxygen, IV lasix, Ativan, cardiac monitoring) Continue Lorazepam (Ativan) 1 mg every 4 hours PRN anxiety Continue fentanyl 12.5 mcg IV every 1 hours PRN pain No pressors, lab draws, or invasive interventions Do not recommend BiPAP [name] is not ready for hospice at this time Continue outpatient palliative at discharge   Goals of Care and Additional Recommendations: Limitations on Scope of Treatment: No Lab Draws  Code Status:  Prognosis: Very poor  Discharge Planning:  SNF with outpatient palliative care   Thank you for allowing the Palliative Medicine Team to assist in the care of this patient.   Total Time 35 minutes Prolonged Time Billed  no       Greater than 50%  of this time was spent counseling and coordinating care related to the above assessment and plan.  Lavena Bullion, NP  Please contact Palliative Medicine Team phone at [phone] for questions and concerns.

## 2020-10-08 MED ORDER — LACTATED RINGERS IV SOLN: INTRAVENOUS | Status: DC

## 2020-10-08 MED ORDER — NAPHAZOLINE-GLYCERIN 0.03-0.5 % OP SOLN
1.0000 [drp] | Freq: Four times a day (QID) | OPHTHALMIC | Status: DC | PRN
  Administered 2020-10-08: 1 [drp] via OPHTHALMIC
  Administered 2020-10-09 – 2020-10-10 (×2): 2 [drp] via OPHTHALMIC
  Filled 2020-10-08 (×2): qty 15

## 2020-10-08 NOTE — Progress Notes (Signed)
Subjective: HD 3 Overnight, no acute events reported.  This morning, Kristin Adkins was evaluated at bedside. She is resting comfortably in bed. She notes that her mouth is dry and is requesting to drink something. She does not recall what she last ate. Otherwise, she does not report any acute concerns.   Objective:  Vital signs in last 24 hours: Vitals:   10/07/20 1638 10/07/20 2000 10/07/20 2330 10/08/20 0536  BP: 112/66 (!) 114/51 (!) 120/51 117/61  Pulse: 87 88 83 84  Resp: '16 20 18 20  '$ Temp: 98.6 F (37 C) 98.2 F (36.8 C) 97.6 F (36.4 C) 97.9 F (36.6 C)  TempSrc: Axillary Oral Oral Oral  SpO2:  95%  97%  Weight:      Height:       CBC Latest Ref Rng & Units 09/25/2020 09/04/2020 08/29/2020  WBC 4.0 - 10.5 K/uL 11.4(H) 5.9 5.4  Hemoglobin 12.0 - 15.0 g/dL 11.8(L) 12.6 11.8(L)  Hematocrit 36.0 - 46.0 % 37.8 38.5 35.7(L)  Platelets 150 - 400 K/uL 109(L) 133(L) 127(L)   BMP Latest Ref Rng & Units 09/25/2020 09/04/2020 09/03/2020  Glucose 70 - 99 mg/dL 150(H) - 111(H)  BUN 8 - 23 mg/dL 36(H) - 38(H)  Creatinine 0.44 - 1.00 mg/dL 2.49(H) 2.22(H) 1.99(H)  BUN/Creat Ratio 12 - 28 - - -  Sodium 135 - 145 mmol/L 134(L) - 134(L)  Potassium 3.5 - 5.1 mmol/L 4.7 5.0 4.3  Chloride 98 - 111 mmol/L 104 - 101  CO2 22 - 32 mmol/L 22 - 26  Calcium 8.9 - 10.3 mg/dL 9.8 - 9.7   Physical Exam  Constitutional: Frail appearing elderly female. No distress.  HENT: Normocephalic and atraumatic, EOMI, moist mucous membranes, no JVD Cardiovascular: Normal rate, regular rhythm, S1 and S2 present, no murmurs, rubs, gallops.  Distal pulses intact Respiratory:   Effort is normal.  Lungs are clear to auscultation bilaterally. GI: Nondistended, soft, nontender to palpation, active bowel sounds Skin: Warm and dry.  No rash, erythema, lesions noted.  Assessment/Plan:  Active Problems:   Acute respiratory failure with hypoxia (HCC)   Shortness of breath  Kristin Adkins is a 87 year old  female with PMHx of hypertension, combined systolic and diastolic heart failure (EF 30-35%), A.fib on Eliquis, CKD IV, and advanced dementia admitted for acute respiratory failure 2/2 CHF exacerbation.  Acute respiratory failure 2/2 CHF exacerbation: Patient has had multiple hospitalizations for this. She is followed by palliative care as outpatient. Patient received IV Lasix on 4/28 with recorded urine output of 350cc since admission. I suspect that this is inaccurate. Patient appears euvolemic on exam today. She was on 1L O2 via [place] overnight and is on room air this morning during my admission. Respirations are unlabored.  - Continue to monitor volume status - Supplemental oxygen as needed - Continue to hold home IMDUR, spironolactone, and torsemide  Goals of care:  Patient has had multiple admissions for her end-stage combined systolic and diastolic heart failure over the past several months. She is followed by palliative care as outpatient. Per goals of care discussion with patient's son on 4/30, would like to continue monitoring patient for any improvement in her oral intake over the next several days and will re-visit. He is not ready to transition to full hospice care at this time, but if she were to decompensate, plans to transition to comfort care.  - Palliative care recommendations appreciated  - No escalation of care at this time - No  routine labs for monitoring   Paroxysmal atrial fibrillation: Patient on amiodarone and Eliquis. This has been held on admission. Currently in NSR with HR in 80s. As no routine labs for monitoring, holding Eliquis given history of CKDIV and current unknown renal function. Also noted to have prolonged QTc on telemetry. Will continue to hold amiodarone.  - Continue cardiac monitoring - Continue to hold amiodarone and Eliquis   Diet: Comfort feeds Fluids: None DVT Prophylaxis: SCDs Code status: DNR  Prior to Admission Living Arrangement: SNF Anticipated  Discharge Location: SNF Barriers to Discharge:  Continued medical management  Dispo: Anticipated discharge in approximately 1-2 day(s).   Harvie Heck, MD 10/08/2020, 6:49 AM Pager: '@[email or web address] @ After 5pm on weekdays and 1pm on weekends: On Call pager [phone]

## 2020-10-08 NOTE — Plan of Care (Signed)

## 2020-10-09 ENCOUNTER — Encounter (HOSPITAL_COMMUNITY): Payer: Self-pay | Admitting: Internal Medicine

## 2020-10-09 NOTE — Evaluation (Signed)
Occupational Therapy Evaluation Patient Details Name: Kristin Adkins MRN: [id number] DOB: June 03, 1921 Today's Date: 10/09/2020    History of Present Illness 87 y.o. female admitted for acute respiratory failure secondary to acute heart failure exacerbation. Multiple recent admission for same presentation (most recently admitted 4/18/4/19). PMH of combined heart failure, atrial fibrillation, CKD IV, and dementia.   Clinical Impression   PTA, pt was living at [facility] and perform chart review used w/c for mobility. Pt currently requiring Min A for UB ADLs, Max A for LB ADLs, and Max A +2 for sit<>stand. Pt currently limited by significant pain at BLEs and poor activity tolerance. Pt participating in grooming tasks and BUE/BLE AROM exercises; fatigues quickly stating "I gave out". Pt would benefit from further acute OT to facilitate safe dc. Recommend dc to SNF for further OT to optimize safety, independence with ADLs, and return to PLOF.     Follow Up Recommendations  SNF    Equipment Recommendations  None recommended by OT    Recommendations for Other Services PT consult     Precautions / Restrictions Precautions Precautions: Fall Precaution Comments: watch O2 Restrictions Weight Bearing Restrictions: No      Mobility Bed Mobility Overal bed mobility: Needs Assistance             General bed mobility comments: pt OOB in recliner at start and end of session    Transfers Overall transfer level: Needs assistance Equipment used: 1 person hand held assist;2 person hand held assist Transfers: Sit to/from Stand Sit to Stand: Max assist;+2 physical assistance         General transfer comment: pt able to stand with max-totalA of 1 through front-facing bilateral UE support with use of gait belt to facilitate hip extension. pt unable to achieve stand x2 with attempt to use maxA of 2 due to reports of pain in R foot    Balance Overall balance assessment: Needs  assistance Sitting-balance support: Feet supported;No upper extremity supported Sitting balance-Leahy Scale: Fair Sitting balance - Comments: Static sitting in chair, supervision for safety.   Standing balance support: Bilateral upper extremity supported;During functional activity Standing balance-Leahy Scale: Zero Standing balance comment: dependent on max-totalA from PT                           ADL either performed or assessed with clinical judgement   ADL Overall ADL's : Needs assistance/impaired Eating/Feeding: Set up;Sitting Eating/Feeding Details (indicate cue type and reason): Pt declining to eat Grooming: Set up;Sitting;Supervision/safety;Brushing hair Grooming Details (indicate cue type and reason): Pt brushing her hair and then fatiguing stating "my arm gave out" Upper Body Bathing: Minimal assistance;Sitting   Lower Body Bathing: Maximal assistance;Sit to/from stand   Upper Body Dressing : Minimal assistance;Sitting   Lower Body Dressing: Maximal assistance;Sit to/from stand   Toilet Transfer: Maximal assistance Toilet Transfer Details (indicate cue type and reason): Max A for sit<>stand due to pain at R foot         Functional mobility during ADLs: Maximal assistance;+2 for safety/equipment;+2 for physical assistance (sit<>stand only due to pain) General ADL Comments: Decreased cognition, balance, and strength     Vision Patient Visual Report:  (per chart review, pt wears glassess all the time)       Perception     Praxis      Pertinent Vitals/Pain Pain Assessment: Faces Faces Pain Scale: Hurts even more Pain Location: R foot/leg Pain Descriptors / Indicators: Constant;Discomfort;Grimacing;Moaning Pain Intervention(s):  Monitored during session;Limited activity within patient's tolerance;Repositioned     Hand Dominance Right   Extremity/Trunk Assessment Upper Extremity Assessment Upper Extremity Assessment: Generalized weakness   Lower  Extremity Assessment Lower Extremity Assessment: Defer to PT evaluation;LLE deficits/detail;RLE deficits/detail RLE Deficits / Details: significant pain with wt bearing, pt unable to clearly detail pain, unable to tolerate standing. ROM WFL LLE Deficits / Details: Increased edema near knee   Cervical / Trunk Assessment Cervical / Trunk Assessment: Kyphotic   Communication Communication Communication: HOH   Cognition Arousal/Alertness: Awake/alert Behavior During Therapy: WFL for tasks assessed/performed;Impulsive Overall Cognitive Status: History of cognitive impairments - at baseline                                 General Comments: History of dementia. Pt with ST memory deficits and repeating certain questions such as "whats wrong with me" or "why do I feel like this?"   General Comments  VSS on RA (SpO2 95% on RA)    Exercises Exercises: General Lower Extremity;Other exercises General Exercises - Lower Extremity Ankle Circles/Pumps: AROM;Both;5 reps;Seated Long Arc Quad: AROM;Both;10 reps;Seated (pt needing rest break after 7, continued with remaining 3 after rest) Hip Flexion/Marching: AROM;Both;10 reps;Seated Other Exercises Other Exercises: cross-body UE reaching x 4 each arm, pt reports too fatigued to complete additional reps "my arms gave out"   Shoulder Instructions      Home Living Family/patient expects to be discharged to:: Skilled nursing facility                                 Additional Comments: From [facility]      Prior Functioning/Environment Level of Independence: Needs assistance  Gait / Transfers Assistance Needed: Per chart, pt using w/c for mobility. When asking pt about use of RW, pt stating "I dont remember" ADL's / Homemaking Assistance Needed: When asking pt about ADLs, pt stating "I dont remember".            OT Problem List: Decreased activity tolerance;Impaired balance (sitting and/or standing);Decreased  cognition;Decreased safety awareness;Decreased knowledge of use of DME or AE;Cardiopulmonary status limiting activity      OT Treatment/Interventions: Self-care/ADL training;Therapeutic exercise;Energy conservation;DME and/or AE instruction;Therapeutic activities;Patient/family education;Balance training    OT Goals(Current goals can be found in the care plan section) Acute Rehab OT Goals Patient Stated Goal: to rest OT Goal Formulation: Patient unable to participate in goal setting Time For Goal Achievement: 10/23/20 Potential to Achieve Goals: Fair ADL Goals Pt Will Perform Grooming: with set-up;sitting;with supervision Pt Will Perform Lower Body Dressing: with min assist;sit to/from stand Pt Will Transfer to Toilet: with min guard assist;bedside commode;ambulating Pt Will Perform Toileting - Clothing Manipulation and hygiene: sit to/from stand;with min assist;sitting/lateral leans Pt/caregiver will Perform Home Exercise Program: Increased strength;Both right and left upper extremity;With written HEP provided;With minimal assist  OT Frequency: Min 2X/week   Barriers to D/C:            Co-evaluation PT/OT/SLP Co-Evaluation/Treatment: Yes Reason for Co-Treatment: For patient/therapist safety;To address functional/ADL transfers;Complexity of the patient's impairments (multi-system involvement) PT goals addressed during session: Mobility/safety with mobility;Balance;Strengthening/ROM OT goals addressed during session: ADL's and self-care      AM-PAC OT "6 Clicks" Daily Activity     Outcome Measure Help from another person eating meals?: A Little Help from another person taking care of personal grooming?: A  Little Help from another person toileting, which includes using toliet, bedpan, or urinal?: A Lot Help from another person bathing (including washing, rinsing, drying)?: A Lot Help from another person to put on and taking off regular upper body clothing?: A Little Help from  another person to put on and taking off regular lower body clothing?: A Lot 6 Click Score: 15   End of Session Equipment Utilized During Treatment: Gait belt Nurse Communication: Mobility status  Activity Tolerance: Patient limited by fatigue Patient left: in chair;with call bell/phone within reach  OT Visit Diagnosis: Muscle weakness (generalized) (M62.81);Other symptoms and signs involving cognitive function;Unsteadiness on feet (R26.81);Other abnormalities of gait and mobility (R26.89)                Time: [id number] OT Time Calculation (min): 18 min Charges:  OT General Charges $OT Visit: 1 Visit OT Evaluation $OT Eval Moderate Complexity: [place], OTR/L Acute Rehab Pager: [phone] Office: [place] 10/09/2020, 6:03 PM

## 2020-10-09 NOTE — Evaluation (Signed)
Physical Therapy Evaluation Patient Details Name: Kristin Adkins MRN: [id number] DOB: Jun 20, 1920 Today's Date: 10/09/2020   History of Present Illness  87 y.o. female admitted for acute respiratory failure secondary to acute heart failure exacerbation. Multiple recent admission for same presentation (most recently admitted 4/18/4/19). PMH of combined heart failure, atrial fibrillation, CKD IV, and dementia.    Clinical Impression  Pt in bed upon arrival of PT, agreeable to evaluation at this time. The pt is an unreliable historian, and reports "I just don't remember" when asked about prior levels of mobility or assist needed. The pt now presents with limitations in functional mobility, strength, power, stability, and endurance due to above dx, chronic debility, and pain, and will continue to benefit from skilled PT to address these deficits. The pt was able to complete multiple exercises for LE ROM and strengthening, but fatigues after 5-7 reps against gravity at this time requiring extended rest prior to return to activity, and required maxA of 1 to stand. The pt reports significant pain in her R foot, and was unable to tolerate wt bearing on that foot due to pain, therefore no further transfers or gait could be performed at this time. All activities were performed while the pt was on RA at this time, SpO2 remained at 95%. The pt could benefit from continued skilled therapies to maintain ROM and strength as tolerated by the pt and as remains appropriate with goals of care.    Follow Up Recommendations SNF;Supervision/Assistance - 24 hour    Equipment Recommendations  None recommended by PT    Recommendations for Other Services       Precautions / Restrictions Precautions Precautions: Fall Precaution Comments: watch O2 Restrictions Weight Bearing Restrictions: No      Mobility  Bed Mobility Overal bed mobility: Needs Assistance             General bed mobility comments: pt OOB in  recliner at start and end of session    Transfers Overall transfer level: Needs assistance Equipment used: 1 person hand held assist;2 person hand held assist Transfers: Sit to/from Stand Sit to Stand: Max assist;+2 physical assistance         General transfer comment: pt able to stand with max-totalA of 1 through front-facing bilateral UE support with use of gait belt to facilitate hip extension. pt unable to achieve stand x2 with attempt to use maxA of 2 due to reports of pain in R foot  Ambulation/Gait             General Gait Details: pt unable to maintain stance or achieve stepping at this time      Balance Overall balance assessment: Needs assistance Sitting-balance support: Feet supported;No upper extremity supported Sitting balance-Leahy Scale: Fair Sitting balance - Comments: Static sitting in chair, supervision for safety.   Standing balance support: Bilateral upper extremity supported;During functional activity Standing balance-Leahy Scale: Zero Standing balance comment: dependent on max-totalA from PT                             Pertinent Vitals/Pain Pain Assessment: Faces Faces Pain Scale: Hurts whole lot Pain Location: R foot/leg Pain Descriptors / Indicators: Constant;Discomfort;Grimacing;Moaning Pain Intervention(s): Limited activity within patient's tolerance;Monitored during session;Repositioned    Home Living Family/patient expects to be discharged to:: Skilled nursing facility                 Additional Comments: From [facility]  Prior Function Level of Independence: Needs assistance   Gait / Transfers Assistance Needed: Per chart, pt using w/c for mobility. When asking pt about use of RW, pt stating "I dont remember"  ADL's / Homemaking Assistance Needed: When asking pt about ADLs, pt stating "I dont remember".        Hand Dominance   Dominant Hand: Right    Extremity/Trunk Assessment   Upper Extremity  Assessment Upper Extremity Assessment: Generalized weakness    Lower Extremity Assessment Lower Extremity Assessment: Generalized weakness;RLE deficits/detail (increased edema to bilateral LE (L>R). able to move through full ROM at ankle, knee, and hip but significant limitations in muscular endurance) RLE Deficits / Details: significant pain with wt bearing, pt unable to clearly detail pain, unable to tolerate standing. ROM WFL LLE Deficits / Details: Increased edema    Cervical / Trunk Assessment Cervical / Trunk Assessment: Kyphotic  Communication   Communication: HOH  Cognition Arousal/Alertness: Awake/alert Behavior During Therapy: WFL for tasks assessed/performed;Impulsive Overall Cognitive Status: History of cognitive impairments - at baseline                                 General Comments: History of dementia. Pt with ST memory deficits and repeating certain questions such as "whats wrong with me" or "why do I feel like this?"      General Comments General comments (skin integrity, edema, etc.): VSS on RA (SpO2 95% on RA)    Exercises General Exercises - Lower Extremity Ankle Circles/Pumps: AROM;Both;5 reps;Seated Long Arc Quad: AROM;Both;10 reps;Seated (pt needing rest break after 7, continued with remaining 3 after rest) Hip Flexion/Marching: AROM;Both;10 reps;Seated Other Exercises Other Exercises: cross-body UE reaching x 4 each arm, pt reports too fatigued to complete additional reps "my arms gave out"   Assessment/Plan    PT Assessment Patient needs continued PT services  PT Problem List Decreased strength;Decreased activity tolerance;Decreased balance;Decreased mobility;Decreased coordination;Decreased cognition;Decreased safety awareness;Cardiopulmonary status limiting activity;Decreased range of motion;Decreased knowledge of use of DME       PT Treatment Interventions DME instruction;Gait training;Functional mobility training;Therapeutic  activities;Therapeutic exercise;Balance training;Patient/family education;Neuromuscular re-education    PT Goals (Current goals can be found in the Care Plan section)  Acute Rehab PT Goals Patient Stated Goal: to rest PT Goal Formulation: With patient/family Time For Goal Achievement: 10/23/20 Potential to Achieve Goals: Fair    Frequency Min 2X/week   Barriers to discharge        Co-evaluation PT/OT/SLP Co-Evaluation/Treatment: Yes Reason for Co-Treatment: Complexity of the patient's impairments (multi-system involvement);Necessary to address cognition/behavior during functional activity;To address functional/ADL transfers PT goals addressed during session: Mobility/safety with mobility;Balance;Strengthening/ROM         AM-PAC PT "6 Clicks" Mobility  Outcome Measure Help needed turning from your back to your side while in a flat bed without using bedrails?: A Lot Help needed moving from lying on your back to sitting on the side of a flat bed without using bedrails?: A Lot Help needed moving to and from a bed to a chair (including a wheelchair)?: A Lot Help needed standing up from a chair using your arms (e.g., wheelchair or bedside chair)?: A Lot Help needed to walk in hospital room?: Total Help needed climbing 3-5 steps with a railing? : Total 6 Click Score: 10    End of Session Equipment Utilized During Treatment: Gait belt Activity Tolerance: Patient tolerated treatment well;Patient limited by fatigue Patient left: in chair;with  call bell/phone within reach Nurse Communication: Mobility status PT Visit Diagnosis: Unsteadiness on feet (R26.81);Other abnormalities of gait and mobility (R26.89);History of falling (Z91.81);Muscle weakness (generalized) (M62.81);Difficulty in walking, not elsewhere classified (R26.2)    Time: [id number] PT Time Calculation (min) (ACUTE ONLY): 20 min   Charges:   PT Evaluation $PT Eval Low Complexity: 1 Low          Karma Ganja, PT, DPT    Acute Rehabilitation Department Pager #: [phone]  Otho Bellows 10/09/2020, 5:59 PM

## 2020-10-09 NOTE — Care Management Important Message (Signed)
Important Message  Patient Details  Name: Kristin Adkins MRN: [id number] Date of Birth: 13-Sep-1920   Medicare Important Message Given:  Yes     [name] 10/09/2020, 3:36 PM

## 2020-10-09 NOTE — Discharge Summary (Addendum)
Name: Kristin Adkins MRN: [id number] DOB: [birth year] 87 y.o. PCP: Raelene Bott, MD  Date of Admission: 10/05/2020  7:17 AM Date of Discharge: 10/11/2020 Attending Physician: Sid Falcon, MD  Discharge Diagnosis: 1. Acute respiratory failure secondary to CHF exacerbation  2. Diarrhea secondary to volume overload 3. PAD  Discharge Medications: Allergies as of 10/11/2020      Reactions   Codeine Other (See Comments)   UNKNOWN   Nitrofurantoin Other (See Comments)   UNKNOWN   Penicillins Rash      Medication List    STOP taking these medications   amiodarone 100 MG tablet Commonly known as: PACERONE   apixaban 2.5 MG Tabs tablet Commonly known as: ELIQUIS   spironolactone 25 MG tablet Commonly known as: ALDACTONE     TAKE these medications   acetaminophen 500 MG tablet Commonly known as: TYLENOL Take 500 mg by mouth every 6 (six) hours as needed for fever or mild pain (discomfort).   albuterol (2.5 MG/3ML) 0.083% nebulizer solution Commonly known as: PROVENTIL Take 3 mLs (2.5 mg total) by nebulization every 6 (six) hours as needed for shortness of breath or wheezing.   aspirin EC 81 MG tablet Take 1 tablet (81 mg total) by mouth daily. Swallow whole.   famotidine 10 MG tablet Commonly known as: PEPCID Take 1 tablet (10 mg total) by mouth daily.   feeding supplement Liqd Take 237 mLs by mouth 3 (three) times daily between meals.   isosorbide mononitrate 30 MG 24 hr tablet Commonly known as: IMDUR Take 1 tablet (30 mg total) by mouth daily. What changed: You were already taking a medication with the same name, and this prescription was added. Make sure you understand how and when to take each.   isosorbide mononitrate 30 MG 24 hr tablet Commonly known as: IMDUR Take 1 tablet (30 mg total) by mouth daily. What changed:   medication strength  how much to take   magnesium oxide 400 MG tablet Commonly known as: MAG-OX Take 400 mg by mouth daily.    melatonin 3 MG Tabs tablet Take 6 mg by mouth at bedtime.   multivitamin with minerals tablet Take 1 tablet by mouth daily.   nitroGLYCERIN 0.4 MG SL tablet Commonly known as: NITROSTAT Place 0.4 mg under the tongue every 5 (five) minutes as needed for chest pain.   ondansetron 4 MG tablet Commonly known as: ZOFRAN Take 4 mg by mouth every 8 (eight) hours. Taking at 0600, 1400, and 2200 for 5 days starting 10/01/20.   OXYGEN Inhale 2 L into the lungs See admin instructions. Every evening and night shift to maintain O2 sat greater than 90%   potassium chloride SA 20 MEQ tablet Commonly known as: KLOR-CON Take 1 tablet (20 mEq total) by mouth daily.   torsemide 20 MG tablet Commonly known as: DEMADEX Take 1 tablet (20 mg total) by mouth 2 (two) times daily.            Durable Medical Equipment  (From admission, onward)         Start     Ordered   10/11/20 1340  DME Oxygen  Once       Question Answer Comment  Length of Need Lifetime   Mode or (Route) Nasal cannula   Liters per Minute 2   Frequency Only at night (stationary unit needed)   Oxygen delivery system Gas      10/11/20 1342          Disposition  and follow-up:   [name] was discharged from [facility] in Stable condition.  At the hospital follow up visit please address:  1.  Acute respiratory failure secondary to CHF exacerbation: euvolemic at discharge following treatment with IV Lasix; continued on home torsemide; please assess volume status and adjust diuretics as needed. F/u BMP   Diarrhea secondary to volume overload: resolved    Goals of care: will follow up with palliative care services    PAD: started on aspirin '81mg'$  daily   2.  Labs / imaging needed at time of follow-up: none  3.  Pending labs/ test needing follow-up: none  Follow-up Appointments:  Follow-up Information    Raelene Bott, MD Follow up.   Specialty: Internal Medicine Why: Pllease call and  make an appointment to follow up with your PCP regarding your hospitalization for sometime in the next few weeks.  Contact information: [address] [place] [address] [place] [facility] Course by problem list: 1.  Acute respiratory failure secondary to Heart Failure Exacerbation Kristin Adkins is a 87 year old female with a history of HTN, combined systolic and diastolic heart failure, a fib, CKD IV, and dementia who presented to the ED on 10/05/20 due to acute respiratory failure. This is her 7th hospitalization in the past few months for the same problem and was last seen in the ED on 09/25/2020 for this issue and responded well to lasix at that time.  At baseline, she uses a wheelchair to get around, is on 2L nasal cannula, is incontinent of urine, is able to converse but is confused at baseline, and eats 50-75% of her meals most of the time. Nursing notes she quickly desats if she happens to pull her oxygen off or get up without it.  Staff noted her to be in acute respiratory distress when she woke up morning of admission.  In the ED patient initially appeared to be actively dying, but stabilized on 16 L NRB. After conversations with the son he was agreeable to IV lasix and supplemental oxygen therapy. He requested no escalation of care including no BiPAP or pressors. He also requested no lab draws and understood the risk of electrolyte disturbances with lasix treatment.  Patient was able to be weaned to 3L [place] after a few hours on NRB. Overnight, patient was able to be weaned to 2.5L [place] and put out approximately 600-700 mL or urine. On exam. 10/06/2020 patient appeared euvolemic and no further lasix was ordered. (total lasix = 100 mg). She remained euvolemic throughout the remainder of her admission and was weaned to room air on 05/02 and remained sating >94% on room air throughout the remainder of her admission.   2. Diarrhea secondary to volume  overload After admission patient had 3 large watery mucus filled foul smelling bowel movements. On exam 10/06/2020 patient had significant tenderness in lower abdomen with LLQ worse than RLQ. Patient was then tested for C difficile, which was negative.  She had no further loose bowel movements and abdominal tenderness had resolved by the afternoon of 04/29.  Given that all episodes of loose bowel movements occurred temporally close to the time frame in which she was volume overloaded secondary to hear failure exacerbation and resolved in conjunction with resolution of volume overload, etiology of diarrhea believed to be due to volume overload.   3. Goals of care This is the  patient's 7th admission in the last few months for heart failure exacerbation leading to respiratory failure. Patient's son is health care power of attorney and is not ready to transition to hospice at this time, but plans to transition to complete comfort care if she decompensates. He is very concerned regarding her decreased PO intake, but does not want feeding tubes or IV nutrition.He has has expressed that he doesn't want his mother to suffer and doesn't want to pursue invasive or aggressive medical interventions. For now, he wants to continue to "treat the treatable". This would include treating an infection with an antibiotic if needed, titrating the oxygen as needed, and utilizing IV lasix if needed.  4. PAD On exam on 04/29 the patient was noted to have significant tenderness in her lower extremities, significantly worse on the R. Her R leg was cooler than left, with decreased DP on the R compared to the left. In the setting of her known cardiovascular disease and goals of care this was believed to be related to PAD and it was determined that she would not benefit from further work up as she is not a surgical candidate. On 05/02 she worked with PT and it was noted that she was unable to bear weight on the R ankle. X ray of that  ankle were then done to rule out acute injury or infection. This showed no evidence of fracture or osteomyelitis, but did show atherosclerotic arterial calcification further supporting that this pain is due to PAD. Started on aspirin '81mg'$  daily. Given goals of care no further work up was completed.   Discharge Exam:   BP 122/68 (BP Location: Right Arm)   Pulse 72   Temp 98.4 F (36.9 C) (Oral)   Resp 18   Ht '5\' 2"'$  (1.575 m)   Wt 65.6 kg   SpO2 94%   BMI 26.45 kg/m  Discharge exam:  Physical Exam Constitutional:      General: She is not in acute distress.    Comments: Frail elderly woman sitting comfortably in bed in no acute distress, conversational and asking about her son  Cardiovascular:     Rate and Rhythm: Normal rate and regular rhythm.  Pulmonary:     Effort: Pulmonary effort is normal.     Breath sounds: Normal breath sounds.  Abdominal:     Palpations: Abdomen is soft.  Musculoskeletal:     Right lower leg: Tenderness present. No edema.     Left lower leg: No tenderness. No edema.  Skin:    General: Skin is warm and dry.     Comments: No bruising present on lower extremities    Pertinent Labs, Studies, and Procedures:  CBC Latest Ref Rng & Units 09/25/2020 09/04/2020 08/29/2020  WBC 4.0 - 10.5 K/uL 11.4(H) 5.9 5.4  Hemoglobin 12.0 - 15.0 g/dL 11.8(L) 12.6 11.8(L)  Hematocrit 36.0 - 46.0 % 37.8 38.5 35.7(L)  Platelets 150 - 400 K/uL 109(L) 133(L) 127(L)   BMP Latest Ref Rng & Units 09/25/2020 09/04/2020 09/03/2020  Glucose 70 - 99 mg/dL 150(H) - 111(H)  BUN 8 - 23 mg/dL 36(H) - 38(H)  Creatinine 0.44 - 1.00 mg/dL 2.49(H) 2.22(H) 1.99(H)  BUN/Creat Ratio 12 - 28 - - -  Sodium 135 - 145 mmol/L 134(L) - 134(L)  Potassium 3.5 - 5.1 mmol/L 4.7 5.0 4.3  Chloride 98 - 111 mmol/L 104 - 101  CO2 22 - 32 mmol/L 22 - 26  Calcium 8.9 - 10.3 mg/dL 9.8 - 9.7  Ref Range & Units 4 d ago  C Diff antigen NEGATIVE NEGATIVE   C Diff toxin NEGATIVE NEGATIVE   C Diff  interpretation  No C. difficile detected.    DG R ankle complete IMPRESSION: No evident fracture or significant joint space narrowing. Ankle mortise appears intact. There are small calcaneal spurs. Multiple foci of atherosclerotic arterial vascular calcification noted.  Discharge Instructions: Discharge Instructions    (HEART FAILURE PATIENTS) Call MD:  Anytime you have any of the following symptoms: 1) 3 pound weight gain in 24 hours or 5 pounds in 1 week 2) shortness of breath, with or without a dry hacking cough 3) swelling in the hands, feet or stomach 4) if you have to sleep on extra pillows at night in order to breathe.   Complete by: As directed    Call MD for:  difficulty breathing, headache or visual disturbances   Complete by: As directed    Call MD for:  extreme fatigue   Complete by: As directed    Call MD for:  hives   Complete by: As directed    Call MD for:  persistant dizziness or light-headedness   Complete by: As directed    Call MD for:  persistant nausea and vomiting   Complete by: As directed    Call MD for:  severe uncontrolled pain   Complete by: As directed    Call MD for:  temperature >100.4   Complete by: As directed    Diet - low sodium heart healthy   Complete by: As directed    Discharge instructions   Complete by: As directed    You were diagnosed with acute respiratory failure due to your heart failure. Please stop taking your spironolactone and amiodarone. We have decreased your IMDUR to 30 mg daily. Please continue taking your other medications as prescribed.   Increase activity slowly   Complete by: As directed       Signed: Harvie Heck, MD  [facility] PGY-2 10/11/2020, 1:49 PM   Pager: [phone]

## 2020-10-09 NOTE — Plan of Care (Signed)

## 2020-10-09 NOTE — Progress Notes (Signed)
Patient ID: Kristin Adkins, female   DOB: [birth year], 87 y.o.   MRN: [id number]   Subjective: HD#4   No acute overnight events.   Patient reports no difficulty breathing this morning. She asked what she was hooked up to and purpose of oxygen/heart monitor leads were explained. She was sitting comfortably in a chair in no acute distress. Of note she complained of continued R lower extremity tenderness.  Spoke with [name], the patient's son, about goal of care again this morning. He is still concerned about her lack of strength and decreased PO intake. He agrees that she has been improving since admission, but wishes to see continued improvement before discharge. Discussed with him risk v benefits and explained that the acute care provided in the hospital is not what is needed at this time. Rather a rehab facility would be better equipped to help the patient with her PT/OT and nursing needs as she works towards improving her strength and that these are things that would not be able to be provided as consistently and effectively in the hospital compared to a rehab facility.   Objective:  Vital signs in last 24 hours: Vitals:   10/08/20 1930 10/08/20 2330 10/09/20 0404 10/09/20 0715  BP: (!) 115/58 109/71 113/60   Pulse: 90 84 87   Resp: '20 20 17   '$ Temp: 97.7 F (36.5 C) 97.8 F (36.6 C) 97.6 F (36.4 C)   TempSrc: Oral Oral Axillary   SpO2:  98% 91% 96%  Weight:      Height:       Weight change:   Intake/Output Summary (Last 24 hours) at 10/09/2020 [id number] Last data filed at 10/09/2020 0400 Gross per 24 hour  Intake 240 ml  Output 450 ml  Net -210 ml   Physical Exam Constitutional:      Comments: Elderly woman sitting comfortably chair in no acute distress  Cardiovascular:     Rate and Rhythm: Normal rate and regular rhythm.     Heart sounds: Normal heart sounds.  Pulmonary:     Effort: Pulmonary effort is normal.     Breath sounds: Normal breath sounds.  Musculoskeletal:      Right lower leg: Tenderness present. No edema.     Left lower leg: No tenderness. No edema.  Skin:    General: Skin is warm and dry.    Assessment/Plan:  Active Problems:   Acute respiratory failure with hypoxia (HCC)   Shortness of breath  Cj Platek is a 87 y.o. female with a history of combined heart failure, atrial fibrillation, CKD IV, and dementia admitted for acute respiratory failure secondary to acute heart failure exacerbation.   Acute respiratory failure secondary to HF exacerbation  Patient received lasix good 4/28 with good response, urine output measured was inaccurate due to clogged catheter due to diarrhea. Patient has remained euvolemic throughout the weekend and was sitting comfortably in a chair on rounds this morning. Oxygen was removed and she remained >95% sat. Medically stable.  - continue fentanyl PRN for pain  - continue lorazepam PRN for agitation and distress  - supplemental oxygen as needed - continue to hold home IMDUR, spironolactone, and torsemide  Goals of care This is the 7th admission for this in the past couple of months. Patient's son wishes her to be monitored for increased oral intake. He is not ready transition to hospice at this time, but if she were to decompensate plans to transition to full comfort care. Discussed with son that patient  is medically stable for discharge. Discussed the risk v benefits of continued hospitalization and that the patient will likely be discharged tomorrow.  Son is focused on seeing improved strength in his mother, and we discussed that the hospital is not the setting that will be most beneficial for this goal as the acute treatment we can provide here has been completed and PT/OT and nursing needs would be better managed in a rehab facility. - appreciate palliative care recommendations - no escalation of care: including no pressors, no lab draws, no bipap - PT/OT referral  - social work confirmed that patient is  eligable to return to [facility] at discharge. Will need a repeat COVID test and insurance approval though before discharge.   Atrial fibrillation Patient has had prolonged QTc on telemetry, though currently NSR with HR in the 80s.  - will continue to hold amiodarone  - continue cardiac monitoring Unable to monitor renal function, and history of CKD IV.  - continue to hold eliquis   Diet: Comfort feeds Fluids: None DVT Prophylaxis: SCDs Code status: DNR  Prior to Admission Living Arrangement: SNF Anticipated Discharge Location: SNF Dispo: Anticipated discharge in next 24 hours.    LOS: 4 days   Mikael Spray, Medical Student 10/09/2020, 8:37 AM

## 2020-10-09 NOTE — Progress Notes (Signed)
CSW spoke with Kristin Adkins at [facility], confirmed pt is LTC resident there eligible to return. [name], MSW, LCSW 5/2/20229:42 AM

## 2020-10-10 ENCOUNTER — Inpatient Hospital Stay (HOSPITAL_COMMUNITY): Payer: Medicare Other

## 2020-10-10 DIAGNOSIS — M25571 Pain in right ankle and joints of right foot: Secondary | ICD-10-CM

## 2020-10-10 LAB — MRSA PCR SCREENING: MRSA by PCR: NEGATIVE

## 2020-10-10 LAB — SARS CORONAVIRUS 2 (TAT 6-24 HRS): SARS Coronavirus 2: NEGATIVE

## 2020-10-10 MED ORDER — ADULT MULTIVITAMIN W/MINERALS CH
1.0000 | ORAL_TABLET | Freq: Every day | ORAL | Status: DC
  Administered 2020-10-10 – 2020-10-11 (×2): 1 via ORAL
  Filled 2020-10-10 (×2): qty 1

## 2020-10-10 MED ORDER — LOPERAMIDE HCL 2 MG PO CAPS: 2.0000 mg | ORAL_CAPSULE | ORAL | Status: DC | PRN

## 2020-10-10 MED ORDER — LOPERAMIDE HCL 2 MG PO CAPS
2.0000 mg | ORAL_CAPSULE | Freq: Once | ORAL | Status: AC
  Administered 2020-10-10: 2 mg via ORAL
  Filled 2020-10-10: qty 1

## 2020-10-10 MED ORDER — ENSURE ENLIVE PO LIQD
237.0000 mL | Freq: Three times a day (TID) | ORAL | Status: DC
  Administered 2020-10-10 – 2020-10-11 (×4): 237 mL via ORAL

## 2020-10-10 NOTE — Progress Notes (Signed)
[facility]) Hospital Liaison note:  This patient is currently enrolled in [facility] outpatient-based Palliative Care. Will continue to follow for disposition.  Please call with any outpatient palliative questions or concerns.  Thank you, Lorelee Market, LPN [facility] Liaison [phone]

## 2020-10-10 NOTE — [place] FL2 (Signed)
[place] MEDICAID FL2 LEVEL OF CARE SCREENING TOOL     IDENTIFICATION  Patient Name: Aarian Burfeind Birthdate: [birth year] Sex: female Admission Date (Current Location): 10/05/2020  [facility] and [place] Number:  Herbalist and Address:  [facility], [place] [address] [place], [place] [address]      Provider Number: [id number]  Attending Physician Name and Address:  Sid Falcon, MD  Relative Name and Phone Number:  ATALEE, CALIP)   [phone] [facility])    Current Level of Care:   Recommended Level of Care: [place] Prior Approval Number:    Date Approved/Denied:   PASRR Number: [id number]  Discharge Plan: SNF    Current Diagnoses: Patient Active Problem List   Diagnosis Date Noted  . Shortness of breath   . Acute respiratory failure with hypoxia ([place]) 10/05/2020  . NSTEMI (non-ST elevated myocardial infarction) ([place]) 08/15/2020  . Pressure injury of skin 08/12/2020  . SIRS (systemic inflammatory response syndrome) ([place]) 08/11/2020  . Acute kidney injury superimposed on chronic kidney disease ([place]) 08/11/2020  . Acute on chronic combined systolic and diastolic CHF (congestive heart failure) ([place]) 07/30/2020  . DNR (do not resuscitate) 07/30/2020  . Acute on chronic respiratory failure with hypoxia and hypercapnia ([place]) 07/30/2020  . AF (paroxysmal atrial fibrillation) ([place]) 07/30/2020  . Chronic anticoagulation 07/30/2020  . History of COVID-19 07/30/2020  . Type 2 diabetes mellitus without complication ([place]) [id number]  . Prolonged QT interval 07/30/2020  . Acute lower UTI 07/30/2020  . Respiratory failure (HCC)     Orientation RESPIRATION BLADDER Height & Weight     Self  Normal Incontinent Weight: 144 lb 10 oz (65.6 kg) Height:  '5\' 2"'$  (157.5 cm)  BEHAVIORAL SYMPTOMS/MOOD NEUROLOGICAL BOWEL NUTRITION STATUS      Incontinent Diet (Regular diet.  See discharge summary)  AMBULATORY STATUS COMMUNICATION OF  NEEDS Skin   Total Care Verbally Normal                       Personal Care Assistance Level of Assistance    Bathing Assistance: Maximum assistance Feeding assistance: Limited assistance Dressing Assistance: Maximum assistance     Functional Limitations Info    Sight Info: Impaired Hearing Info: Impaired Speech Info: Adequate    SPECIAL CARE FACTORS FREQUENCY  PT (By licensed PT),OT (By licensed OT)     PT Frequency: 5x week OT Frequency: 5x week            Contractures Contractures Info: Not present    Additional Factors Info  Code Status Code Status Info: DNR Allergies Info: Codeine, Nitrofurantoin, Penicillins           Current Medications (10/10/2020):  This is the current hospital active medication list Current Facility-Administered Medications  Medication Dose Route Frequency Provider Last Rate Last Admin  . fentaNYL (SUBLIMAZE) injection 12.5 mcg  12.5 mcg Intravenous Q1H PRN Mitzi Hansen, MD   12.5 mcg at 10/06/20 1855  . LORazepam (ATIVAN) tablet 1 mg  1 mg Oral Q4H PRN Bloomfield, Carley D, DO       Or  . LORazepam (ATIVAN) 2 MG/ML concentrated solution 1 mg  1 mg Sublingual Q4H PRN [place]field, Carley D, DO       Or  . LORazepam (ATIVAN) injection 1 mg  1 mg Intravenous Q4H PRN Bloomfield, Carley D, DO      . Naphazoline-Glycerin 0.03-0.5 % SOLN 1-2 drop  1-2 drop Both Eyes QID PRN Aslam,  Loralyn Freshwater, MD   2 drop at 10/09/20 [id number]     Discharge Medications: Please see discharge summary for a list of discharge medications.  Relevant Imaging Results:  Relevant Lab Results:   Additional Information SSN-[id number]  Joanne Chars, LCSW

## 2020-10-10 NOTE — TOC Initial Note (Addendum)
Transition of Care [facility]) - Initial/Assessment Note    Patient Details  Name: Kristin Adkins MRN: [id number] Date of Birth: [birth year]  Transition of Care [facility]) CM/SW Contact:    Joanne Chars, LCSW Phone Number: 10/10/2020, 9:14 AM  Clinical Narrative:  Pt with dementia and currently under isolation precautions.  CSW spoke with son [name] regarding recommendation for SNF.  Pt is current LTC resident at [facility], Son in agreement with rehab, but does not want pt "pushed too hard" and wants pt returned to LTC if it proves to not be helpful. Pt has been vaccinated for covid and boosted.   1520: CSW spoke with son, palliative has already been in place since last month, [facility] is provider.  CSW spoke with Bevely Palmer at [facility] and she will alert team at [employer].                    Expected Discharge Plan: Skilled Nursing Facility Barriers to Discharge: No Barriers Identified   Patient Goals and CMS Choice Patient states their goals for this hospitalization and ongoing recovery are:: better strength, treat the treatable CMS Medicare.gov Compare Post Acute Care list provided to::  (Pt from [place], son wants pt to return)    Expected Discharge Plan and Services Expected Discharge Plan: [place] Choice: [place] Living arrangements for the past 2 months: [place] (LTC)                                      Prior Living Arrangements/Services Living arrangements for the past 2 months: [place] (LTC) Lives with:: Facility Resident Patient language and need for interpreter reviewed:: No        Need for Family Participation in Patient Care: Yes (Comment) Care giver support system in place?: Yes (comment) Current home services: Other (comment) (None) Criminal Activity/Legal Involvement Pertinent to Current Situation/Hospitalization: No - Comment as needed  Activities of Daily  Living Home Assistive Devices/Equipment: Wheelchair ADL Screening (condition at time of admission) Patient's cognitive ability adequate to safely complete daily activities?: No Is the patient deaf or have difficulty hearing?: Yes Does the patient have difficulty seeing, even when wearing glasses/contacts?: Yes Does the patient have difficulty concentrating, remembering, or making decisions?: Yes Patient able to express need for assistance with ADLs?: Yes Does the patient have difficulty dressing or bathing?: Yes Independently performs ADLs?: No Does the patient have difficulty walking or climbing stairs?: Yes Weakness of Legs: Both Weakness of Arms/Hands: None  Permission Sought/Granted                  Emotional Assessment Appearance::  (pt under isolation, unable to assess) Attitude/Demeanor/Rapport: Unable to Assess Affect (typically observed): Unable to Assess Orientation: : Oriented to Self Alcohol / Substance Use: Not Applicable Psych Involvement: No (comment)  Admission diagnosis:  Shortness of breath [R06.02] Acute respiratory failure with hypoxia ([place]) [J96.01] Patient Active Problem List   Diagnosis Date Noted  . Shortness of breath   . Acute respiratory failure with hypoxia ([place]) 10/05/2020  . NSTEMI (non-ST elevated myocardial infarction) ([place]) 08/15/2020  . Pressure injury of skin 08/12/2020  . SIRS (systemic inflammatory response syndrome) ([place]) 08/11/2020  . Acute kidney injury superimposed on chronic kidney disease ([place]) 08/11/2020  . Acute on chronic combined systolic and diastolic CHF (congestive heart failure) ([place]) 07/30/2020  . DNR (  do not resuscitate) 07/30/2020  . Acute on chronic respiratory failure with hypoxia and hypercapnia ([place]) 07/30/2020  . AF (paroxysmal atrial fibrillation) ([place]) 07/30/2020  . Chronic anticoagulation 07/30/2020  . History of COVID-19 07/30/2020  . Type 2 diabetes mellitus without complication ([place]) [id number]  .  Prolonged QT interval 07/30/2020  . Acute lower UTI 07/30/2020  . Respiratory failure ([place])    PCP:  Raelene Bott, MD Pharmacy:  No Pharmacies Listed    Social Determinants of Health (SDOH) Interventions    Readmission Risk Interventions No flowsheet data found.

## 2020-10-10 NOTE — Progress Notes (Signed)
Patient ID: Kristin Adkins, female   DOB: Feb 28, 1921, 87 y.o.   MRN: [id number]   Subjective: HD#5  No acute overnight events.   Patient reports no difficulty breathing and reports feeling alright this morning on rounds. She overall appears much improved since admission. She continue to report pain in her R lower extremity, but only with palpation.   Objective:  Vital signs in last 24 hours: Vitals:   10/09/20 1625 10/09/20 2034 10/10/20 0423 10/10/20 0730  BP: (!) 104/49 (!) 100/42 125/60   Pulse:  91 90   Resp:  20 20   Temp:  98.1 F (36.7 C) 98.1 F (36.7 C)   TempSrc:  Oral Oral   SpO2:  98% 97% 94%  Weight:      Height:       Weight change:  No intake or output data in the 24 hours ending 10/10/20 1312  Physical Exam Constitutional:      General: elderly woman awake and sitting comfortably in bed Cardiovascular:     Rate and Rhythm: Normal rate and regular rhythm.  Pulmonary:     Effort: Pulmonary effort is normal.     Breath sounds: Normal breath sounds.  Abdominal:     Palpations: Abdomen is soft.  Musculoskeletal:     Right lower leg: Tenderness present in ankle. No edema.     Left lower leg: No tenderness. No edema.  Skin:    General: Skin is warm and dry. No bruising noted on lower extremities.    Assessment/Plan:  Active Problems:   Acute respiratory failure with hypoxia (HCC)   Shortness of breath  Kristin Adkins is a 87 y.o. female with a history of combined heart failure, atrial fibrillation, CKD IV, and dementia admitted for acute respiratory failure secondary to acute heart failure exacerbation.   Acute respiratory failure secondary to HF exacerbation  Patient received lasix good 4/28 with good response, urine output measured was inaccurate due to clogged catheter due to diarrhea. Patient continue to be euvolemic on exam. She remains on room air at >95% sat. Medically stable.  Overall, much improved since admission and would benefit from rehab  outside of acute hospitalization.  - continue fentanyl PRN for pain  - continue lorazepam PRN for agitation and distress  - supplemental oxygen as needed - continue to hold home IMDUR, spironolactone, and torsemide  Goals of care This is the 7th admission for this in the past couple of months. Patient's son wishes her to be monitored for increased oral intake. He is not ready transition to hospice at this time, but if she were to decompensate plans to transition to full comfort care. Long discussion with son yesterday regarding goals of care. He expressed continued concern about anticipated discharge.  - no escalation of care: including no pressors, no lab draws, no bipap - PT/OT recommended 5x/week - social work confirmed that patient is eligable to return to [facility] SNF at discharge. Will need a repeat COVID test and insurance approval though before discharge. Expected approval tomorrow.   Atrial fibrillation Patient has had prolonged QTc on telemetry, though currently NSR with HR in the 80s.  - will continue to hold amiodarone  - continue cardiac monitoring  Unable to monitor renal function, and history of CKD IV.  - continue to hold eliquis   R leg pain  R leg pain believed to be due to PAD given decreased DP pulse on R side compared to L. However, given significant limitation of mobility due to  pain as noted in PT evaluation will rule out acute injury. Will not further work up for PAD at this time as patient will not benefit from work up.  - DG R ankle   Diet: Comfort feeds Fluids: None DVT Prophylaxis: SCDs Code status: DNR  Prior to Admission Living Arrangement:SNF Anticipated Discharge Location:SNF Dispo: Anticipated discharge tomorrow      LOS: 5 days   Mikael Spray, Medical Student 10/10/2020, 1:12 PM

## 2020-10-10 NOTE — Progress Notes (Signed)
Initial Nutrition Assessment  DOCUMENTATION CODES:   Not applicable  INTERVENTION:  Ensure Enlive po TID, each supplement provides 350 kcal and 20 grams of protein  MVI with minerals daily  NUTRITION DIAGNOSIS:   Inadequate oral intake related to poor appetite as evidenced by meal completion < 50%.  GOAL:   Patient will meet greater than or equal to 90% of their needs  MONITOR:   PO intake,Supplement acceptance,Weight trends,Skin,Labs,I & O's  REASON FOR ASSESSMENT:   Consult Assessment of nutrition requirement/status  ASSESSMENT:   Pt with PMH significant for combined heart failure, afib, CKD IV, and dementia admitted for acute respiratory failure 2/2 acute heart failure exacerbation.  Pt unavailable at time of RD visit x2 attempts. Note pt with anticipated discharge tomorrow.  Per RN, pt has had poor PO intake -- 10-30% meal completion x3 recorded meals (22% average meal intake). Will provide oral nutrition supplements.   No UOP documented.   Medications and labs reviewed.   Diet Order:   Diet Order            Diet regular Room service appropriate? Yes; Fluid consistency: Thin  Diet effective now                 EDUCATION NEEDS:   No education needs have been identified at this time  Skin:  Skin Assessment: Skin Integrity Issues: Skin Integrity Issues:: Stage I Stage I: R heel, sacrum  Last BM:  5/2  Height:   Ht Readings from Last 1 Encounters:  10/05/20 '5\' 2"'$  (1.575 m)    Weight:   Wt Readings from Last 1 Encounters:  10/05/20 65.6 kg    BMI:  Body mass index is 26.45 kg/m.  Estimated Nutritional Needs:   Kcal:  1650-1850  Protein:  80-90 grams  Fluid:  >1.65L/d    Larkin Ina, MS, RD, LDN RD pager number and weekend/on-call pager number located in [place].

## 2020-10-10 NOTE — Plan of Care (Signed)

## 2020-10-11 DIAGNOSIS — G8929 Other chronic pain: Secondary | ICD-10-CM

## 2020-10-11 MED ORDER — ASPIRIN EC 81 MG PO TBEC: 81.0000 mg | DELAYED_RELEASE_TABLET | Freq: Every day | ORAL | 0 refills | Status: AC

## 2020-10-11 MED ORDER — ISOSORBIDE MONONITRATE ER 30 MG PO TB24: 30.0000 mg | ORAL_TABLET | Freq: Every day | ORAL | 11 refills | Status: AC

## 2020-10-11 MED ORDER — ENSURE ENLIVE PO LIQD: 237.0000 mL | Freq: Three times a day (TID) | ORAL | 12 refills | Status: AC

## 2020-10-11 MED ORDER — ISOSORBIDE MONONITRATE ER 30 MG PO TB24: 30.0000 mg | ORAL_TABLET | Freq: Every day | ORAL | 0 refills | Status: AC

## 2020-10-11 NOTE — TOC Transition Note (Signed)
Transition of Care [facility]) - CM/SW Discharge Note   Patient Details  Name: Kristin Adkins MRN: [id number] Date of Birth: 23-Dec-1920  Transition of Care [facility]) CM/SW Contact:  Joanne Chars, LCSW Phone Number: 10/11/2020, 3:19 PM   Clinical Narrative: Pt discharging to [facility], room 605 B.  RN call report to [phone].      Final next level of care: Skilled Nursing Facility Barriers to Discharge: No Barriers Identified   Patient Goals and CMS Choice Patient states their goals for this hospitalization and ongoing recovery are:: better strength, treat the treatable CMS Medicare.gov Compare Post Acute Care list provided to::  (Pt from [place], son wants pt to return)    Discharge Placement              Patient chooses bed at: [place], [place] Patient to be transferred to facility by: [place] Name of family member notified: Docia Barrier Patient and family notified of of transfer: 10/11/20  Discharge Plan and Services     Post Acute Care Choice: [place]                               Social Determinants of Health (SDOH) Interventions     Readmission Risk Interventions No flowsheet data found.

## 2020-10-11 NOTE — TOC Progression Note (Signed)
Transition of Care [facility]) - Progression Note    Patient Details  Name: Kristin Adkins MRN: [id number] Date of Birth: 21-Jul-1920  Transition of Care [facility]) CM/SW Contact  Joanne Chars, LCSW Phone Number: 10/11/2020, 8:34 AM  Clinical Narrative:   CSW received call from [place] at [facility].  SNF authorization will need peer to peer with MD.  MD to call [phone], option 5 by 1230pm today.  MD paged and informed.      Expected Discharge Plan: [place] Barriers to Discharge: No Barriers Identified  Expected Discharge Plan and Services Expected Discharge Plan: [place] Choice: [place] arrangements for the past 2 months: [place] (LTC)                                       Social Determinants of Health (SDOH) Interventions    Readmission Risk Interventions No flowsheet data found.

## 2020-10-11 NOTE — Plan of Care (Signed)
  Problem: Education: Goal: Knowledge of General Education information will improve Description Including pain rating scale, medication(s)/side effects and non-pharmacologic comfort measures Outcome: Progressing   

## 2020-10-16 ENCOUNTER — Ambulatory Visit: Payer: Medicare Other | Admitting: Cardiology

## 2020-10-17 ENCOUNTER — Emergency Department (HOSPITAL_COMMUNITY)
Admission: EM | Admit: 2020-10-17 | Discharge: 2020-11-08 | Disposition: E | Payer: Medicare Other | Attending: Emergency Medicine | Admitting: Emergency Medicine

## 2020-10-17 ENCOUNTER — Other Ambulatory Visit: Payer: Self-pay

## 2020-10-17 ENCOUNTER — Encounter (HOSPITAL_COMMUNITY): Payer: Self-pay | Admitting: Emergency Medicine

## 2020-10-17 ENCOUNTER — Emergency Department (HOSPITAL_COMMUNITY): Payer: Medicare Other

## 2020-10-17 DIAGNOSIS — J449 Chronic obstructive pulmonary disease, unspecified: Secondary | ICD-10-CM | POA: Insufficient documentation

## 2020-10-17 DIAGNOSIS — N189 Chronic kidney disease, unspecified: Secondary | ICD-10-CM | POA: Insufficient documentation

## 2020-10-17 DIAGNOSIS — R0602 Shortness of breath: Secondary | ICD-10-CM | POA: Diagnosis present

## 2020-10-17 DIAGNOSIS — Z20822 Contact with and (suspected) exposure to covid-19: Secondary | ICD-10-CM | POA: Insufficient documentation

## 2020-10-17 DIAGNOSIS — I462 Cardiac arrest due to underlying cardiac condition: Secondary | ICD-10-CM | POA: Diagnosis not present

## 2020-10-17 DIAGNOSIS — Z7982 Long term (current) use of aspirin: Secondary | ICD-10-CM | POA: Diagnosis not present

## 2020-10-17 DIAGNOSIS — I472 Ventricular tachycardia, unspecified: Secondary | ICD-10-CM

## 2020-10-17 DIAGNOSIS — I130 Hypertensive heart and chronic kidney disease with heart failure and stage 1 through stage 4 chronic kidney disease, or unspecified chronic kidney disease: Secondary | ICD-10-CM | POA: Diagnosis not present

## 2020-10-17 DIAGNOSIS — E1122 Type 2 diabetes mellitus with diabetic chronic kidney disease: Secondary | ICD-10-CM | POA: Insufficient documentation

## 2020-10-17 DIAGNOSIS — I469 Cardiac arrest, cause unspecified: Secondary | ICD-10-CM

## 2020-10-17 DIAGNOSIS — I5043 Acute on chronic combined systolic (congestive) and diastolic (congestive) heart failure: Secondary | ICD-10-CM | POA: Insufficient documentation

## 2020-10-17 LAB — CBC WITH DIFFERENTIAL/PLATELET
Abs Immature Granulocytes: 0.34 10*3/uL — ABNORMAL HIGH (ref 0.00–0.07)
Basophils Absolute: 0.1 10*3/uL (ref 0.0–0.1)
Basophils Relative: 0 %
Eosinophils Absolute: 0 10*3/uL (ref 0.0–0.5)
Eosinophils Relative: 0 %
HCT: 42 % (ref 36.0–46.0)
Hemoglobin: 13.1 g/dL (ref 12.0–15.0)
Immature Granulocytes: 2 %
Lymphocytes Relative: 15 %
Lymphs Abs: 2.1 10*3/uL (ref 0.7–4.0)
MCH: 27.3 pg (ref 26.0–34.0)
MCHC: 31.2 g/dL (ref 30.0–36.0)
MCV: 87.5 fL (ref 80.0–100.0)
Monocytes Absolute: 1.1 10*3/uL — ABNORMAL HIGH (ref 0.1–1.0)
Monocytes Relative: 8 %
Neutro Abs: 10.4 10*3/uL — ABNORMAL HIGH (ref 1.7–7.7)
Neutrophils Relative %: 75 %
Platelets: 240 10*3/uL (ref 150–400)
RBC: 4.8 MIL/uL (ref 3.87–5.11)
RDW: 15.1 % (ref 11.5–15.5)
WBC: 14 10*3/uL — ABNORMAL HIGH (ref 4.0–10.5)
nRBC: 0.2 % (ref 0.0–0.2)

## 2020-10-17 LAB — I-STAT CHEM 8, ED
BUN: 102 mg/dL — ABNORMAL HIGH (ref 8–23)
Calcium, Ion: 0.99 mmol/L — ABNORMAL LOW (ref 1.15–1.40)
Chloride: 100 mmol/L (ref 98–111)
Creatinine, Ser: 4 mg/dL — ABNORMAL HIGH (ref 0.44–1.00)
Glucose, Bld: 228 mg/dL — ABNORMAL HIGH (ref 70–99)
HCT: 42 % (ref 36.0–46.0)
Hemoglobin: 14.3 g/dL (ref 12.0–15.0)
Potassium: 6.3 mmol/L (ref 3.5–5.1)
Sodium: 122 mmol/L — ABNORMAL LOW (ref 135–145)
TCO2: 16 mmol/L — ABNORMAL LOW (ref 22–32)

## 2020-10-17 LAB — BASIC METABOLIC PANEL
Anion gap: 17 — ABNORMAL HIGH (ref 5–15)
BUN: 82 mg/dL — ABNORMAL HIGH (ref 8–23)
CO2: 15 mmol/L — ABNORMAL LOW (ref 22–32)
Calcium: 9.8 mg/dL (ref 8.9–10.3)
Chloride: 93 mmol/L — ABNORMAL LOW (ref 98–111)
Creatinine, Ser: 3.92 mg/dL — ABNORMAL HIGH (ref 0.44–1.00)
GFR, Estimated: 10 mL/min — ABNORMAL LOW (ref >60)
Glucose, Bld: 229 mg/dL — ABNORMAL HIGH (ref 70–99)
Potassium: 6.4 mmol/L (ref 3.5–5.1)
Sodium: 125 mmol/L — ABNORMAL LOW (ref 135–145)

## 2020-10-17 LAB — RESP PANEL BY RT-PCR (FLU A&B, COVID) ARPGX2
Influenza A by PCR: NEGATIVE
Influenza B by PCR: NEGATIVE
SARS Coronavirus 2 by RT PCR: NEGATIVE

## 2020-10-17 LAB — TROPONIN I (HIGH SENSITIVITY): Troponin I (High Sensitivity): 318 ng/L (ref <18)

## 2020-10-17 LAB — BRAIN NATRIURETIC PEPTIDE: B Natriuretic Peptide: 2339.6 pg/mL — ABNORMAL HIGH (ref 0.0–100.0)

## 2020-10-17 LAB — MAGNESIUM: Magnesium: 2.8 mg/dL — ABNORMAL HIGH (ref 1.7–2.4)

## 2020-10-17 MED ORDER — SODIUM CHLORIDE 0.9 % IV BOLUS
500.0000 mL | Freq: Once | INTRAVENOUS | Status: AC
  Administered 2020-10-17: 500 mL via INTRAVENOUS

## 2020-10-17 MED ORDER — AMIODARONE LOAD VIA INFUSION: 150.0000 mg | Freq: Once | INTRAVENOUS | Status: DC

## 2020-10-17 MED ORDER — AMIODARONE IV BOLUS ONLY 150 MG/100ML
150.0000 mg | Freq: Once | INTRAVENOUS | Status: AC
  Administered 2020-10-17: 150 mg via INTRAVENOUS
  Filled 2020-10-17: qty 100

## 2020-10-17 MED ORDER — FUROSEMIDE 10 MG/ML IJ SOLN
60.0000 mg | Freq: Once | INTRAMUSCULAR | Status: AC
  Administered 2020-10-17: 60 mg via INTRAVENOUS
  Filled 2020-10-17: qty 6

## 2020-11-08 NOTE — ED Notes (Addendum)
Asystole at 0544 , EDP notified , family at bedside , patient is DNR status . No palpable pulse , [place] no cardiac activity.

## 2020-11-08 NOTE — ED Triage Notes (Signed)
Patient arrived with EMS from [facility] staff reported worsening SOB this evening , frequent Vtach , CBG=267 , lethargy , DNR status . HR=128/min at arrival .

## 2020-11-08 NOTE — ED Notes (Signed)
[facility] notified , case# [phone].

## 2020-11-08 NOTE — ED Provider Notes (Signed)
[facility] EMERGENCY DEPARTMENT Provider Note   CSN: [id number] Arrival date & time:        History Chief Complaint  Patient presents with  . Shortness of Breath    Vtach ( DNR)    Kristin Adkins is a 87 y.o. female w/ hx of CAD, COPD, CHF, A Fib, V Tach, presenting from nursing facility with respiratory distress.  Patient arrives complaining of worsening shortness of breath this evening.  EMS reports he was having frequent runs of V. Tach in the ambulance.  The patient is nonverbal on arrival.  She is gasping.  She cannot provide further history.  Medical record review shows the patient was discharged from the hospital on May 4 after admission for acute respiratory failure secondary to CHF exacerbation.  There is discussion about goals of care if the patient's son who is her power of attorney.  The patient reported did not want aggressive or invasive medical intervention, and wanted simplified treatment, was not ready to commit to hospice at that time.    HPI     Past Medical History:  Diagnosis Date  . AF (atrial fibrillation) ([place])   . CHF (congestive heart failure) ([place])   . CKD (chronic kidney disease), stage IV ([place])   . COPD (chronic obstructive pulmonary disease) ([place])   . Coronary artery disease   . Hypertension   . Myocardial infarction ([place])   . Respiratory failure [facility])     Patient Active Problem List   Diagnosis Date Noted  . Ankle pain, right   . Shortness of breath   . Acute respiratory failure with hypoxia ([place]) 10/05/2020  . NSTEMI (non-ST elevated myocardial infarction) ([place]) 08/15/2020  . Pressure injury of skin 08/12/2020  . SIRS (systemic inflammatory response syndrome) ([place]) 08/11/2020  . Acute kidney injury superimposed on chronic kidney disease ([place]) 08/11/2020  . Acute on chronic combined systolic and diastolic CHF (congestive heart failure) ([place]) 07/30/2020  . DNR (do not resuscitate) 07/30/2020  . Acute on chronic  respiratory failure with hypoxia and hypercapnia ([place]) 07/30/2020  . AF (paroxysmal atrial fibrillation) ([place]) 07/30/2020  . Chronic anticoagulation 07/30/2020  . History of COVID-19 07/30/2020  . Type 2 diabetes mellitus without complication ([place]) [id number]  . Prolonged QT interval 07/30/2020  . Acute lower UTI 07/30/2020  . Respiratory failure [facility])     Past Surgical History:  Procedure Laterality Date  . 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     Family History  Problem Relation Age of Onset  . Diabetes Mellitus II Mother   . Heart failure Father     Social History   Tobacco Use  . Smoking status: Never Smoker  . Smokeless tobacco: Never Used  Substance Use Topics  . Alcohol use: Never    Home Medications Prior to Admission medications   Medication Sig Start Date End Date Taking? Authorizing Provider  acetaminophen (TYLENOL) 500 MG tablet Take 500 mg by mouth every 6 (six) hours as needed for fever or mild pain (discomfort).    [provider]  albuterol (PROVENTIL) (2.5 MG/3ML) 0.083% nebulizer solution Take 3 mLs (2.5 mg total) by nebulization every 6 (six) hours as needed for shortness of breath or wheezing. 08/02/20   Geradine Girt, DO  aspirin EC 81 MG tablet Take 1 tablet (81 mg total) by mouth daily. Swallow whole.  10/11/20 11/10/20  Harvie Heck, MD  famotidine (PEPCID) 10 MG tablet Take 1 tablet (10 mg total) by mouth daily. 08/02/20 08/02/21  Geradine Girt, DO  feeding supplement (ENSURE ENLIVE / ENSURE PLUS) LIQD Take 237 mLs by mouth 3 (three) times daily between meals. 10/11/20   Harvie Heck, MD  isosorbide mononitrate (IMDUR) 30 MG 24 hr tablet Take 1 tablet (30 mg total) by mouth daily. 10/11/20 10/11/21  Harvie Heck, MD  isosorbide mononitrate (IMDUR) 30 MG 24 hr tablet Take 1 tablet (30 mg total) by mouth daily. 10/11/20 11/10/20  Harvie Heck, MD   magnesium oxide (MAG-OX) 400 MG tablet Take 400 mg by mouth daily. 07/20/20 07/20/21  [provider]  melatonin 3 MG TABS tablet Take 6 mg by mouth at bedtime. 07/19/20   [provider]  Multiple Vitamins-Minerals (MULTIVITAMIN WITH MINERALS) tablet Take 1 tablet by mouth daily.    [provider]  nitroGLYCERIN (NITROSTAT) 0.4 MG SL tablet Place 0.4 mg under the tongue every 5 (five) minutes as needed for chest pain. 10/01/20   [provider]  ondansetron (ZOFRAN) 4 MG tablet Take 4 mg by mouth every 8 (eight) hours. Taking at 0600, 1400, and 2200 for 5 days starting 10/01/20. 10/01/20   [provider]  OXYGEN Inhale 2 L into the lungs See admin instructions. Every evening and night shift to maintain O2 sat greater than 90%    [provider]  potassium chloride SA (KLOR-CON) 20 MEQ tablet Take 1 tablet (20 mEq total) by mouth daily. 08/02/20   Geradine Girt, DO  torsemide (DEMADEX) 20 MG tablet Take 1 tablet (20 mg total) by mouth 2 (two) times daily. 09/05/20 10/05/20  Dessa Phi, DO    Allergies    Codeine, Nitrofurantoin, and Penicillins  Review of Systems   Review of Systems  Unable to perform ROS: Patient nonverbal (level 5 caveat)    Physical Exam Updated Vital Signs Temp (!) 101.6 F (38.7 C) (Rectal)   Ht '5\' 5"'$  (1.651 m)   Wt 70 kg   BMI 25.68 kg/m   Physical Exam Constitutional:      Comments: Thin, cachetic, not responsive  HENT:     Head: Normocephalic and atraumatic.  Eyes:     Conjunctiva/sclera: Conjunctivae normal.     Pupils: Pupils are equal, round, and reactive to light.  Cardiovascular:     Rate and Rhythm: Tachycardia present. Rhythm irregular.     Comments: HR 110 irregular Pulmonary:     Comments: 92% on 2L [place], gasping Abdominal:     General: There is no distension.  Skin:    Comments: Cool, mottled skin  Neurological:     Mental Status: Mental status is at baseline.     GCS: GCS eye  subscore is 3. GCS verbal subscore is 1. GCS motor subscore is 6.     ED Results / Procedures / Treatments   Labs (all labs ordered are listed, but only abnormal results are displayed) Labs Reviewed  CBC WITH DIFFERENTIAL/PLATELET - Abnormal; Notable for the following components:      Result Value   WBC 14.0 (*)    Neutro Abs 10.4 (*)    Monocytes Absolute 1.1 (*)    Abs Immature Granulocytes 0.34 (*)    All other components within normal limits  I-STAT CHEM 8, ED - Abnormal; Notable for the following components:   Sodium 122 (*)    Potassium 6.3 (*)    BUN 102 (*)  Creatinine, Ser 4.00 (*)    Glucose, Bld 228 (*)    Calcium, Ion 0.99 (*)    TCO2 16 (*)    All other components within normal limits  RESP PANEL BY RT-PCR (FLU A&B, COVID) ARPGX2  BASIC METABOLIC PANEL  MAGNESIUM  BRAIN NATRIURETIC PEPTIDE  TROPONIN I (HIGH SENSITIVITY)    EKG None  Radiology DG Chest Portable 1 View  Result Date: Oct 28, 2020 CLINICAL DATA:  Shortness of breath. EXAM: PORTABLE CHEST 1 VIEW COMPARISON:  Chest x-ray 09/25/2020 FINDINGS: The heart size and mediastinal contours are unchanged. Aortic arch calcification. Hilar vascular prominence. Biapical pleural/pulmonary scarring. No focal consolidation. Increased interstitial markings. Interval increase in small right pleural effusion. Markedly limited evaluation of known left pleural effusion due to overlying cardiac paddles. No pneumothorax. . IMPRESSION: 1. Pulmonary edema. 2. Interval increase in small right pleural effusion. 3. Markedly limited evaluation of known left pleural effusion due to overlying cardiac paddles. Electronically Signed   By: Iven Finn M.D.   On: Oct 28, 2020 05:49    Procedures Procedures   Medications Ordered in ED Medications  furosemide (LASIX) injection 60 mg (60 mg Intravenous Given 2020-10-28 0533)  amiodarone (NEXTERONE) IV bolus only 150 mg/100 mL (0 mg Intravenous Stopped 10/28/20 0549)  sodium chloride  0.9 % bolus 500 mL (0 mLs Intravenous Stopped 10/28/20 0548)    ED Course  I have reviewed the triage vital signs and the nursing notes.  Pertinent labs & imaging results that were available during my care of the patient were reviewed by me and considered in my medical decision making (see chart for details).   Patient here in respiratory failure.  Tele with persistent V. Tach.  The patient is actively dying.   Labs were drawn initially.  Her son arrived just as the patient went into asystole.  We confirmed the DNR status.  I reassessed patient, with no heart beat, no gag reflex, and patient at cardiac standstill on bedside echocardiogram.  Time of death at 5:50 AM.     Final Clinical Impression(s) / ED Diagnoses Final diagnoses:  Asystole ([place])  V tach ([place])    Rx / DC Orders ED Discharge Orders    None       Saralynn Langhorst, Carola Rhine, MD 2020/10/28 [phone]

## 2020-11-08 NOTE — ED Notes (Signed)
Post mortem care done.

## 2020-11-08 DEATH — deceased

## 2020-12-27 ENCOUNTER — Ambulatory Visit: Payer: Medicare Other | Admitting: Physician Assistant

## 2022-12-06 IMAGING — DX DG CHEST 1V PORT
1 series · 1 of 1 positions shown · non-contrast
Comparison: Yesterday

CLINICAL DATA: Shortness of breath

EXAM:
PORTABLE CHEST 1 VIEW

[chest ap]
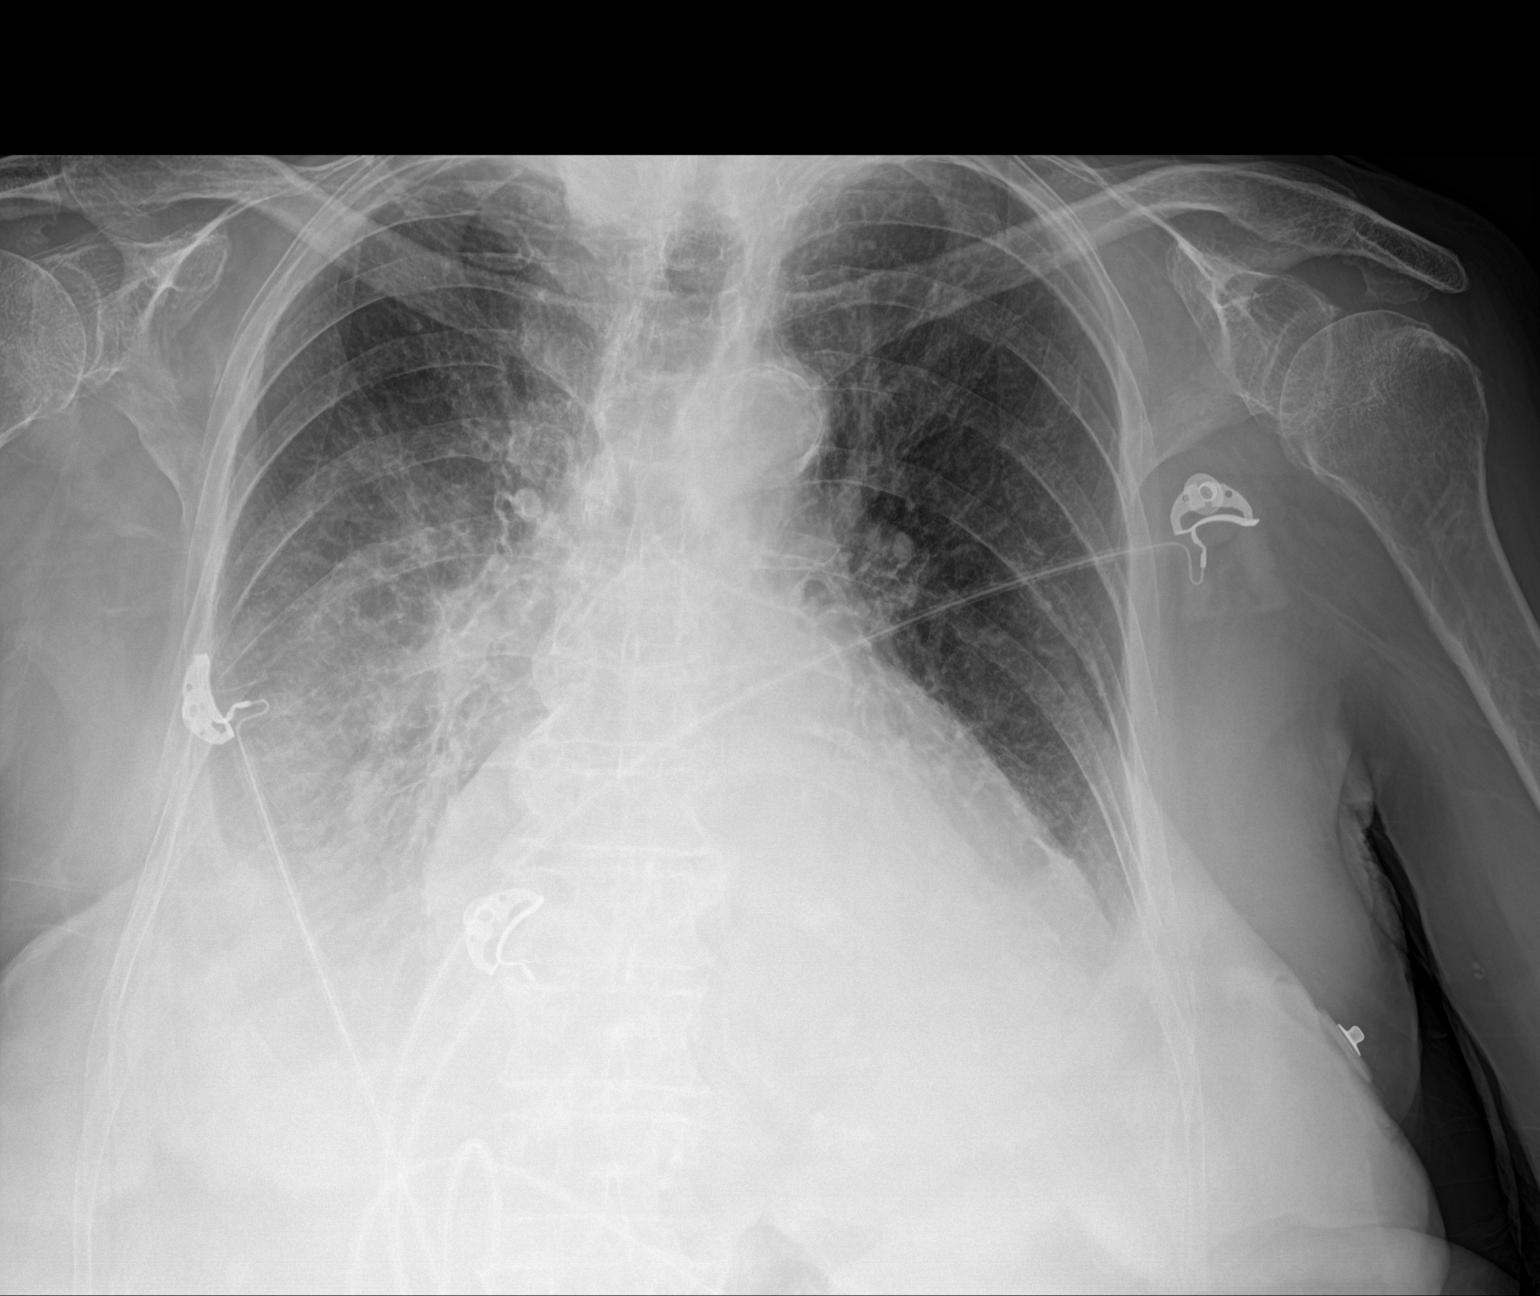

[1 of 1 positions shown; findings below may reference images not displayed]

FINDINGS: Right more than left pleural effusion and adjacent pulmonary
opacity, presumably atelectasis based on June 2020 chest CT.
Cardiomegaly. Negative for pneumothorax or convincing edema.
IMPRESSION: Right more than left pleural effusion. Increased chest opacity from
before may be from differences in positioning and layering.

## 2023-01-02 IMAGING — DX DG CHEST 1V PORT
1 series · 1 of 1 positions shown · non-contrast
Comparison: Portable chest 08/11/2020 and earlier.

CLINICAL DATA: [AGE] female with severe respiratory distress.

EXAM:
PORTABLE CHEST 1 VIEW

[chest ap]
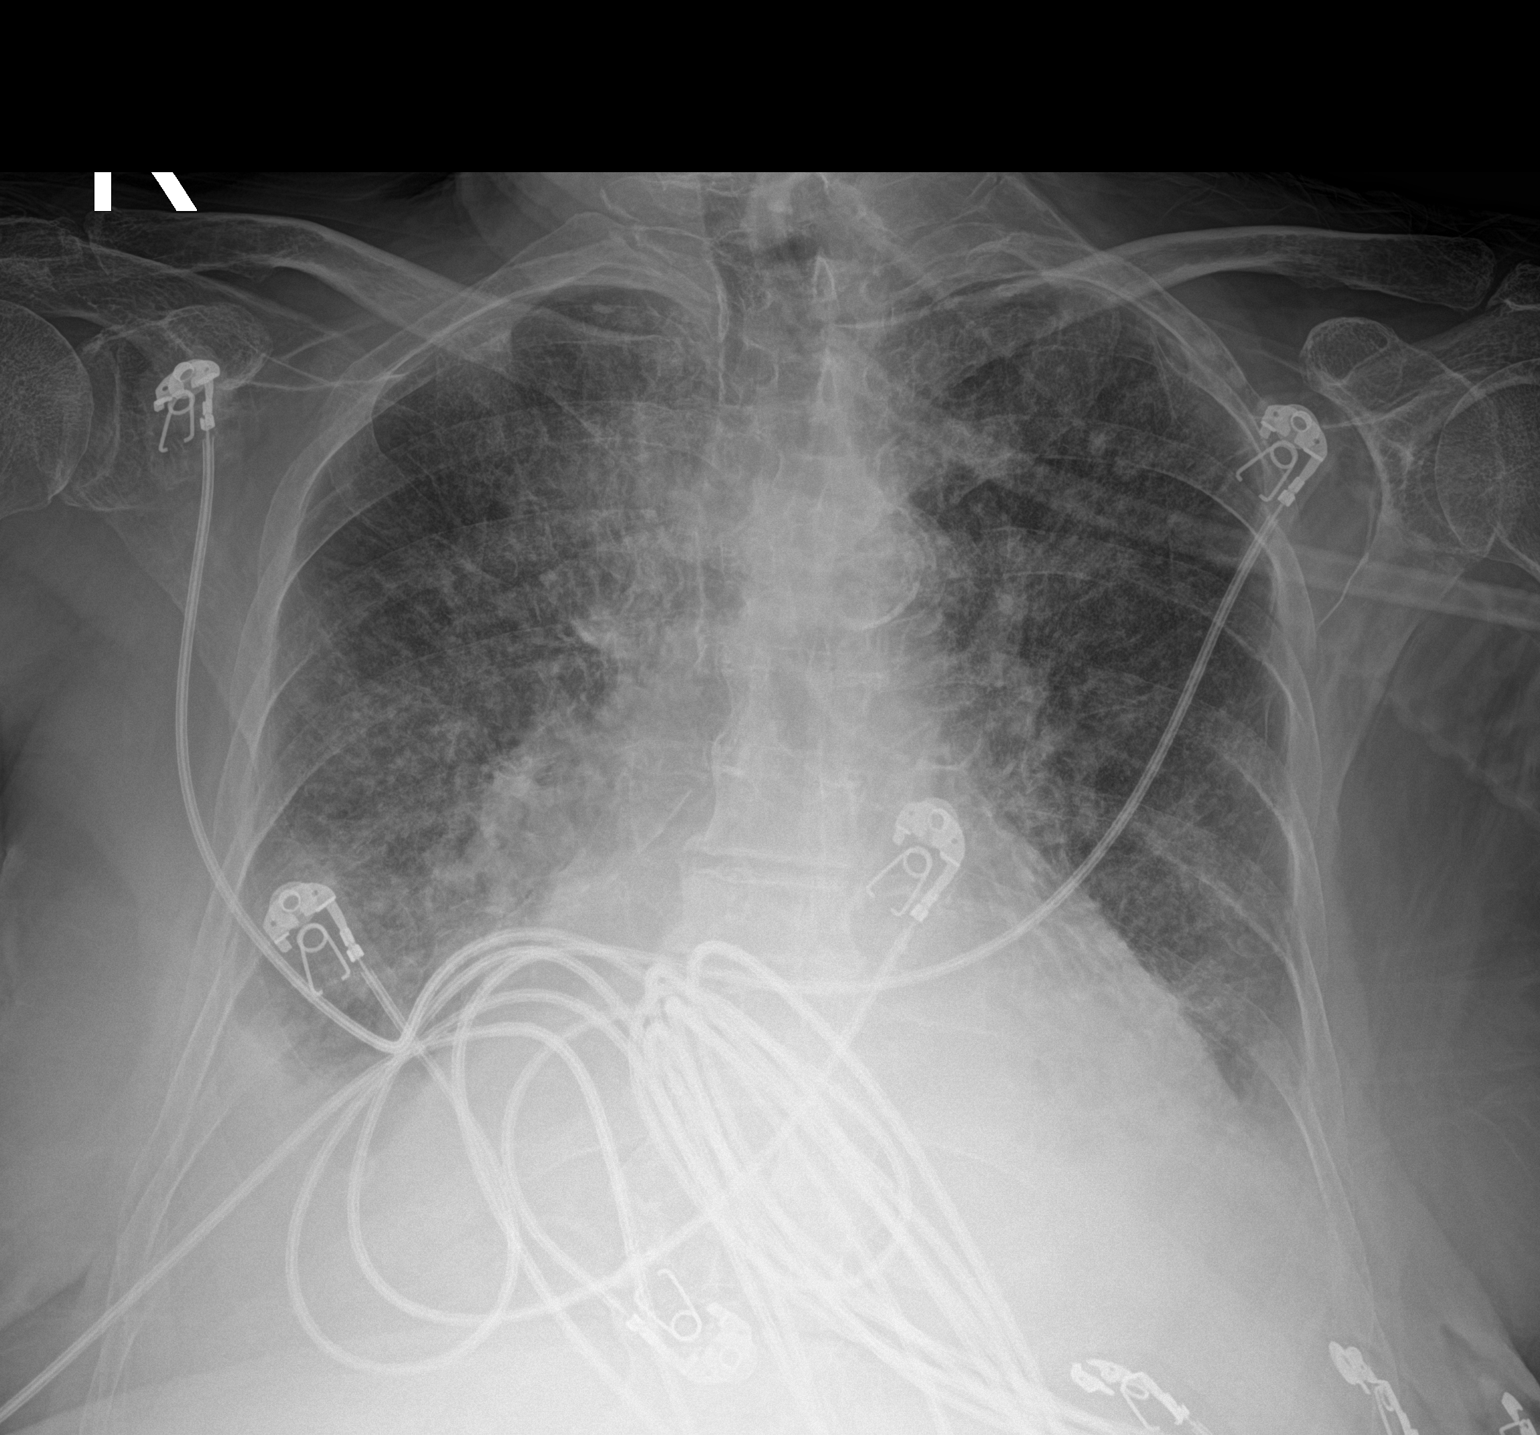

[1 of 1 positions shown; findings below may reference images not displayed]

FINDINGS: Portable AP semi upright view at 1962 hours. Stable cardiomegaly and
mediastinal contours. Bilateral pleural effusions have not
significantly changed. Diffuse pulmonary interstitial opacity has
increased in both lungs. No pneumothorax. Visualized tracheal air
column is within normal limits. No air bronchograms. Osteopenia.
IMPRESSION: Increased diffuse pulmonary interstitial edema. Viral/atypical
respiratory infection felt less likely. Bilateral pleural effusions
with lung base atelectasis appear stable since 08/11/2020.

## 2023-02-22 IMAGING — DX DG CHEST 1V PORT
1 series · 1 of 1 positions shown · non-contrast
Comparison: Chest x-ray 09/25/2020

CLINICAL DATA: Shortness of breath.

EXAM:
PORTABLE CHEST 1 VIEW

[chest]
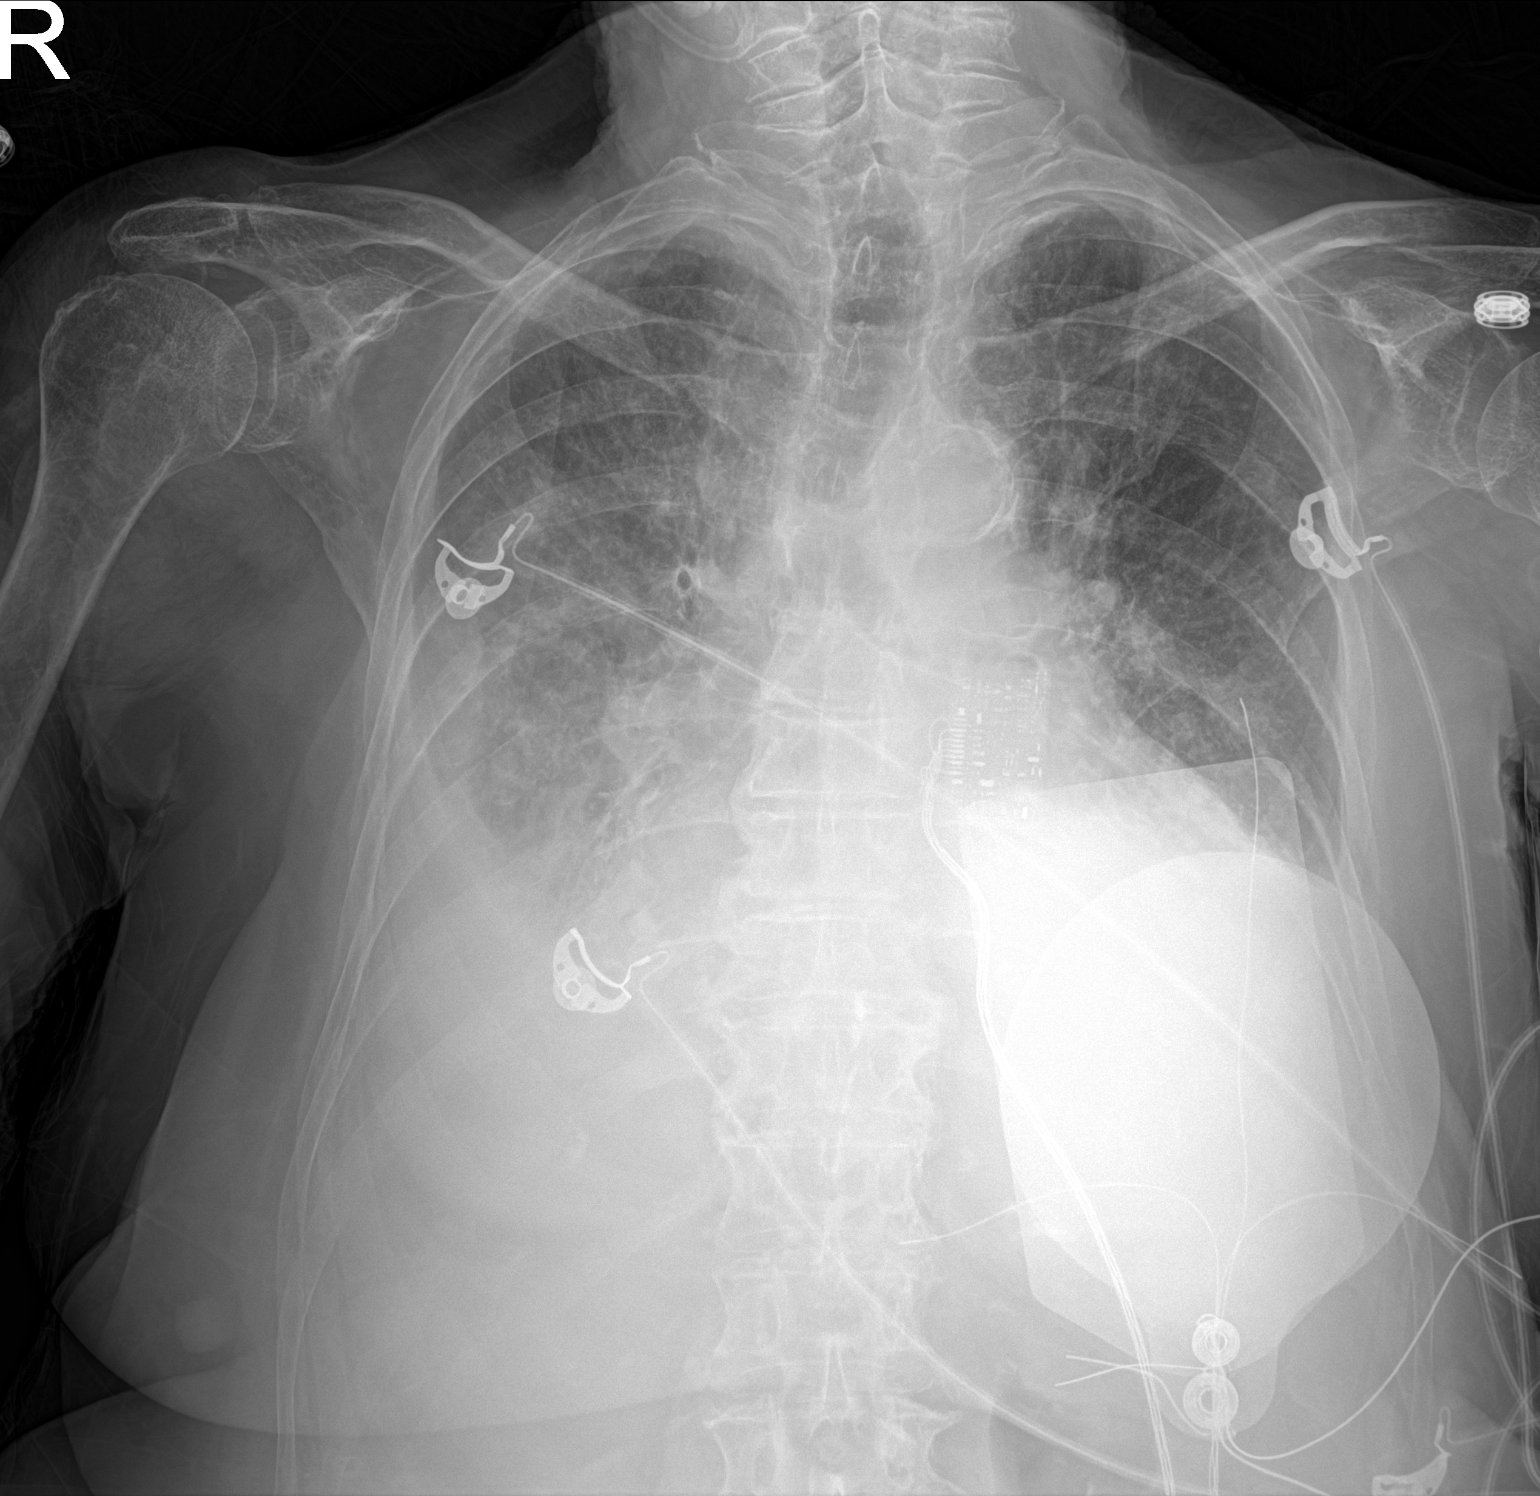

[1 of 1 positions shown; findings below may reference images not displayed]

FINDINGS: The heart size and mediastinal contours are unchanged. Aortic arch
calcification. Hilar vascular prominence.

Biapical pleural/pulmonary scarring. No focal consolidation.
Increased interstitial markings. Interval increase in small right
pleural effusion. Markedly limited evaluation of known left pleural
effusion due to overlying cardiac paddles. No pneumothorax.

.
IMPRESSION: 1. Pulmonary edema.
2. Interval increase in small right pleural effusion.
3. Markedly limited evaluation of known left pleural effusion due to
overlying cardiac paddles.
